# Patient Record
Sex: Female | Born: 1945
Health system: Southern US, Community
[De-identification: ages and names within clinical notes are randomized; demographics above are authoritative.]

## PROBLEM LIST (undated history)

## (undated) DIAGNOSIS — E039 Hypothyroidism, unspecified: Secondary | ICD-10-CM

## (undated) DIAGNOSIS — E538 Deficiency of other specified B group vitamins: Secondary | ICD-10-CM

## (undated) DIAGNOSIS — F1010 Alcohol abuse, uncomplicated: Secondary | ICD-10-CM

## (undated) DIAGNOSIS — F329 Major depressive disorder, single episode, unspecified: Secondary | ICD-10-CM

## (undated) DIAGNOSIS — L4050 Arthropathic psoriasis, unspecified: Secondary | ICD-10-CM

## (undated) DIAGNOSIS — J449 Chronic obstructive pulmonary disease, unspecified: Secondary | ICD-10-CM

## (undated) DIAGNOSIS — F419 Anxiety disorder, unspecified: Secondary | ICD-10-CM

## (undated) DIAGNOSIS — F32A Depression, unspecified: Secondary | ICD-10-CM

## (undated) DIAGNOSIS — M8580 Other specified disorders of bone density and structure, unspecified site: Secondary | ICD-10-CM

## (undated) DIAGNOSIS — M1990 Unspecified osteoarthritis, unspecified site: Secondary | ICD-10-CM

## (undated) HISTORY — DX: Hypothyroidism, unspecified: E03.9

## (undated) HISTORY — PX: VITRECTOMY: SHX106

## (undated) HISTORY — DX: Anxiety disorder, unspecified: F41.9

## (undated) HISTORY — DX: Unspecified osteoarthritis, unspecified site: M19.90

## (undated) HISTORY — DX: Deficiency of other specified B group vitamins: E53.8

## (undated) HISTORY — DX: Other specified disorders of bone density and structure, unspecified site: M85.80

## (undated) HISTORY — DX: Arthropathic psoriasis, unspecified: L40.50

## (undated) HISTORY — DX: Major depressive disorder, single episode, unspecified: F32.9

## (undated) HISTORY — DX: Depression, unspecified: F32.A

## (undated) HISTORY — PX: CATARACT EXTRACTION, BILATERAL: SHX1313

## (undated) HISTORY — DX: Alcohol abuse, uncomplicated: F10.10

---

## 1998-09-04 ENCOUNTER — Encounter: Payer: Self-pay | Admitting: Neurological Surgery

## 1998-09-04 ENCOUNTER — Ambulatory Visit (HOSPITAL_COMMUNITY): Admission: RE | Admit: 1998-09-04 | Discharge: 1998-09-04 | Payer: Self-pay | Admitting: Neurological Surgery

## 1999-03-03 ENCOUNTER — Encounter: Admission: RE | Admit: 1999-03-03 | Discharge: 1999-03-03 | Payer: Self-pay | Admitting: *Deleted

## 1999-03-03 ENCOUNTER — Encounter: Payer: Self-pay | Admitting: *Deleted

## 1999-03-04 ENCOUNTER — Ambulatory Visit (HOSPITAL_COMMUNITY): Admission: RE | Admit: 1999-03-04 | Discharge: 1999-03-04 | Payer: Self-pay | Admitting: Neurological Surgery

## 1999-03-04 ENCOUNTER — Encounter: Payer: Self-pay | Admitting: Neurological Surgery

## 2000-06-21 ENCOUNTER — Encounter: Admission: RE | Admit: 2000-06-21 | Discharge: 2000-09-19 | Payer: Self-pay | Admitting: Anesthesiology

## 2000-07-05 ENCOUNTER — Encounter: Payer: Self-pay | Admitting: *Deleted

## 2000-07-05 ENCOUNTER — Encounter: Admission: RE | Admit: 2000-07-05 | Discharge: 2000-07-05 | Payer: Self-pay | Admitting: *Deleted

## 2001-05-25 ENCOUNTER — Other Ambulatory Visit: Admission: RE | Admit: 2001-05-25 | Discharge: 2001-05-25 | Payer: Self-pay | Admitting: *Deleted

## 2001-07-03 ENCOUNTER — Encounter: Payer: Self-pay | Admitting: *Deleted

## 2001-07-03 ENCOUNTER — Encounter: Admission: RE | Admit: 2001-07-03 | Discharge: 2001-07-03 | Payer: Self-pay | Admitting: *Deleted

## 2001-07-19 ENCOUNTER — Encounter: Payer: Self-pay | Admitting: Neurological Surgery

## 2001-07-19 ENCOUNTER — Encounter: Admission: RE | Admit: 2001-07-19 | Discharge: 2001-07-19 | Payer: Self-pay | Admitting: Neurological Surgery

## 2003-12-03 ENCOUNTER — Encounter: Admission: RE | Admit: 2003-12-03 | Discharge: 2004-01-21 | Payer: Self-pay | Admitting: Neurological Surgery

## 2005-12-30 ENCOUNTER — Encounter: Admission: RE | Admit: 2005-12-30 | Discharge: 2005-12-30 | Payer: Self-pay | Admitting: Neurological Surgery

## 2006-12-12 ENCOUNTER — Inpatient Hospital Stay (HOSPITAL_COMMUNITY): Admission: EM | Admit: 2006-12-12 | Discharge: 2006-12-13 | Payer: Self-pay | Admitting: Emergency Medicine

## 2006-12-12 ENCOUNTER — Encounter (INDEPENDENT_AMBULATORY_CARE_PROVIDER_SITE_OTHER): Payer: Self-pay | Admitting: General Surgery

## 2006-12-16 ENCOUNTER — Emergency Department (HOSPITAL_COMMUNITY): Admission: EM | Admit: 2006-12-16 | Discharge: 2006-12-17 | Payer: Self-pay | Admitting: Emergency Medicine

## 2009-02-14 ENCOUNTER — Encounter: Admission: RE | Admit: 2009-02-14 | Discharge: 2009-02-14 | Payer: Self-pay | Admitting: Family Medicine

## 2009-04-29 ENCOUNTER — Encounter: Payer: Self-pay | Admitting: Emergency Medicine

## 2009-05-07 DIAGNOSIS — F3289 Other specified depressive episodes: Secondary | ICD-10-CM | POA: Insufficient documentation

## 2009-05-07 DIAGNOSIS — M949 Disorder of cartilage, unspecified: Secondary | ICD-10-CM

## 2009-05-07 DIAGNOSIS — M1990 Unspecified osteoarthritis, unspecified site: Secondary | ICD-10-CM | POA: Insufficient documentation

## 2009-05-07 DIAGNOSIS — E538 Deficiency of other specified B group vitamins: Secondary | ICD-10-CM

## 2009-05-07 DIAGNOSIS — F411 Generalized anxiety disorder: Secondary | ICD-10-CM | POA: Insufficient documentation

## 2009-05-07 DIAGNOSIS — J45909 Unspecified asthma, uncomplicated: Secondary | ICD-10-CM | POA: Insufficient documentation

## 2009-05-07 DIAGNOSIS — F1010 Alcohol abuse, uncomplicated: Secondary | ICD-10-CM | POA: Insufficient documentation

## 2009-05-07 DIAGNOSIS — E039 Hypothyroidism, unspecified: Secondary | ICD-10-CM | POA: Insufficient documentation

## 2009-05-07 DIAGNOSIS — F329 Major depressive disorder, single episode, unspecified: Secondary | ICD-10-CM | POA: Insufficient documentation

## 2009-05-07 DIAGNOSIS — M899 Disorder of bone, unspecified: Secondary | ICD-10-CM | POA: Insufficient documentation

## 2009-05-08 ENCOUNTER — Ambulatory Visit: Payer: Self-pay | Admitting: Emergency Medicine

## 2009-06-02 ENCOUNTER — Ambulatory Visit: Payer: Self-pay | Admitting: Emergency Medicine

## 2009-06-02 DIAGNOSIS — L4050 Arthropathic psoriasis, unspecified: Secondary | ICD-10-CM

## 2009-06-02 DIAGNOSIS — J449 Chronic obstructive pulmonary disease, unspecified: Secondary | ICD-10-CM

## 2009-06-02 HISTORY — DX: Arthropathic psoriasis, unspecified: L40.50

## 2009-07-09 ENCOUNTER — Encounter: Payer: Self-pay | Admitting: Emergency Medicine

## 2009-07-17 ENCOUNTER — Encounter: Payer: Self-pay | Admitting: Emergency Medicine

## 2009-09-01 ENCOUNTER — Ambulatory Visit: Payer: Self-pay | Admitting: Emergency Medicine

## 2009-12-30 ENCOUNTER — Ambulatory Visit: Payer: Self-pay | Admitting: Emergency Medicine

## 2010-05-04 ENCOUNTER — Ambulatory Visit
Admission: RE | Admit: 2010-05-04 | Discharge: 2010-05-04 | Payer: Self-pay | Source: Home / Self Care | Attending: Emergency Medicine | Admitting: Emergency Medicine

## 2010-05-07 ENCOUNTER — Ambulatory Visit (HOSPITAL_COMMUNITY)
Admission: RE | Admit: 2010-05-07 | Discharge: 2010-05-07 | Payer: Self-pay | Source: Home / Self Care | Attending: Neurological Surgery | Admitting: Neurological Surgery

## 2010-05-22 LAB — BASIC METABOLIC PANEL
BUN: 11 mg/dL (ref 6–23)
CO2: 30 mEq/L (ref 19–32)
GFR calc Af Amer: >60 mL/min (ref >60)
GFR calc non Af Amer: >60 mL/min (ref >60)
Glucose, Bld: 75 mg/dL (ref 70–99)
Potassium: 3.9 mEq/L (ref 3.5–5.1)
Sodium: 139 mEq/L (ref 135–145)

## 2010-05-22 LAB — TYPE AND SCREEN: ABO/RH(D): A POS

## 2010-05-22 LAB — ABO/RH: ABO/RH(D): A POS

## 2010-05-22 LAB — SURGICAL PCR SCREEN: MRSA, PCR: NEGATIVE

## 2010-05-22 LAB — CBC
Hemoglobin: 12.2 g/dL (ref 12.0–15.0)
MCV: 94.4 fL (ref 78.0–100.0)
RBC: 3.94 MIL/uL (ref 3.87–5.11)

## 2010-05-25 ENCOUNTER — Inpatient Hospital Stay (HOSPITAL_COMMUNITY)
Admission: RE | Admit: 2010-05-25 | Discharge: 2010-05-26 | Disposition: A | Discharge status: Home / Self Care | Payer: Self-pay | Source: Home / Self Care | Attending: Neurological Surgery | Admitting: Neurological Surgery

## 2010-05-25 HISTORY — PX: SPINE SURGERY: SHX786

## 2010-05-28 NOTE — Assessment & Plan Note (Signed)
Summary: COPD   Visit Type:  Follow-up Copy to:  Dr. Juluis Rainier  CC:  4 mo COPD follow-up...the patient says her breathing has slightly improved...no breathing complaints today.  History of Present Illness: 65 yo smoker, former EtOH abuse, hx hypothroidism, DJD, anxiety. Was diagnosed clinically with "asthma" as an adult in 2000 in setting bronchospasm and the flu. Has had similar episodes over the last few yrs, required abx and steroids multiple times. Has been on Advair x 2+ yrs. Also has allergies. Complians today of exhaustion, cough prod of some mucous. Has had some mild epistaxis.   Seen by Dr Zachery Dauer 04/29/09 for acute flare in setting URI. Tretated with pred and abx both are finished, now improved but still with the symptoms above. CXR without infiltrate per pt's report.   ROV 06/02/09 -- returns feeling better since flare. She quit smoking a month ago!!  Had PFT's today as below. Taking Advair two times a day, Using proventil less than every day.   ROV 09/01/09 -- returns for COPD. She has NOT gone back to smoking! Last visit we added Spiriva to Advair two times a day. Also started O2 with exertion and sleep.  Exercise tolerance is significantly improved. No flares since last time.   ROV 12/30/09 -- COPD, ? asthmatic component. presents today for f/u. Has had more trouble w PND, congestion, some more cough prod of white thick phlegm, ? more chest tightness for the last 2 weeks. Using proventil   ROV 05/04/10 -- COPD, suspected reactive component, chronic rhinitis.  Since last visit feels basically stable. No URI's, no flares, no hospitalizations. She is seeing Dr Danielle Dess for back/spine problems, may need repeat back SGY. FEV1 37% predicted (0.72L) Currently on Advair, uses SABA rarely, about weekly.   Current Medications (verified): 1)  Glucosamine 500 Mg Caps (Glucosamine Sulfate) .... Once Daily 2)  Calcium 600 Mg Tabs (Calcium) .... Once Daily 3)  Vicodin 5-500 Mg Tabs  (Hydrocodone-Acetaminophen) .Marland Kitchen.. 1 By Mouth Every 6 Hours As Needed For Pain 4)  Synthroid 50 Mcg Tabs (Levothyroxine Sodium) .Marland Kitchen.. 1 By Mouth Daily 5)  Advair Diskus 500-50 Mcg/dose Aepb (Fluticasone-Salmeterol) .Marland Kitchen.. 1 Puff Two Times A Day 6)  Flonase 50 Mcg/act Susp (Fluticasone Propionate) .Marland Kitchen.. 1 Spray in Each Nostril Daily 7)  Albuterol Sulfate (2.5 Mg/71ml) 0.083% Nebu (Albuterol Sulfate) .Marland Kitchen.. 1 Vial in Nebulizer Every 4-6 Hours As Needed 8)  Proventil Hfa 108 (90 Base) Mcg/act Aers (Albuterol Sulfate) .Marland Kitchen.. 1 Puff Every 4 Hours As Needed 9)  Artificial Tears  Soln (Artificial Tear Solution) .... As Directed 10)  Fish Oil 1000 Mg Caps (Omega-3 Fatty Acids) .Marland Kitchen.. 1 By Mouth Daily 11)  Nicoderm Cq 14 Mg/24hr Pt24 (Nicotine) .... As Directed 12)  Spiriva Handihaler 18 Mcg Caps (Tiotropium Bromide Monohydrate) .Marland Kitchen.. 1 Inhalation Once Daily  Allergies (verified): 1)  ! Phenergan 2)  ! Pcn  Vital Signs:  Patient profile:   65 year old female Height:      61 inches (154.94 cm) Weight:      103 pounds (46.82 kg) BMI:     19.53 O2 Sat:      92 % on 2 L/min pulsing Temp:     97.8 degrees F (36.56 degrees C) oral Pulse rate:   83 / minute BP sitting:   98 / 62  (left arm) Cuff size:   regular  O2 Sat at Rest %:  92 O2 Flow:  2 L/min pulsing CC: 4 mo COPD follow-up...the patient says her  breathing has slightly improved...no breathing complaints today Comments Medications reviewed with patient Michel Bickers CMA  May 04, 2010 3:11 PM   Physical Exam  General:  NAD, thin, throaty voice, no overt stridor  Head:  normocephalic and atraumatic Eyes:  conjunctiva and sclera clear Nose:  no deformity, discharge, inflammation, or lesions Mouth:  erythematous post pharynx Neck:  no masses, thyromegaly, or abnormal cervical nodes, no stridor Lungs:  distance, few insp squeeks, no wheeze in normal resp cycle, Heart:  regular rate and rhythm, S1, S2 without murmurs, rubs, gallops, or  clicks Abdomen:  not examined Msk:  no deformity or scoliosis noted with normal posture Extremities:  no clubbing, cyanosis, edema, or deformity noted Neurologic:  non-focal Skin:  intact without lesions or rashes Psych:  depressed affect.     Impression & Recommendations:  Problem # 1:  COPD (ICD-496)  Problem # 2:  DEGENERATIVE JOINT DISEASE (ICD-715.90)  Her updated medication list for this problem includes:    Vicodin 5-500 Mg Tabs (Hydrocodone-acetaminophen) .Marland Kitchen... 1 by mouth every 6 hours as needed for pain  Other Orders: Est. Patient Level IV (16109)  Patient Instructions: 1)  Please continue your Spiriva and Advair 2)  Use your O2 at all times.  3)  Use Proventil as needed  4)  Follow up with Dr [name] in 4 months or as needed

## 2010-05-28 NOTE — Miscellaneous (Signed)
Summary: Orders Update pft charges  Clinical Lists Changes  Orders: Added new Service order of Carbon Monoxide diffusing w/capacity (94720) - Signed Added new Service order of Lung Volumes (94240) - Signed Added new Service order of Spirometry (Pre & Post) (94060) - Signed 

## 2010-05-28 NOTE — Assessment & Plan Note (Signed)
Summary: COPD   Visit Type:  Follow-up Copy to:  Dr. Juluis Rainier  CC:  3 month COPD follow up.  Pt states breathing is better.  States she has only had a few episodes of wheezing and chest tightness - relates this to the weather.  Pt states she does have "some" coughing - prod with clear mucus.  States spiriva is helping with cough and breathing.  Marland Kitchen  History of Present Illness: 65 yo smoker, former EtOH abuse, hx hypothroidism, DJD, anxiety. Was diagnosed clinically with "asthma" as an adult in 2000 in setting bronchospasm and the flu. Has had similar episodes over the last few yrs, required abx and steroids multiple times. Has been on Advair x 2+ yrs. Also has allergies. Complians today of exhaustion, cough prod of some mucous. Has had some mild epistaxis.   Seen by Dr Zachery Dauer 04/29/09 for acute flare in setting URI. Tretated with pred and abx both are finished, now improved but still with the symptoms above. CXR without infiltrate per pt's report.   ROV 06/02/09 -- returns feeling better since flare. She quit smoking a month ago!!  Had PFT's today as below. Taking Advair two times a day, Using proventil less than every day.   ROV 09/01/09 -- returns for COPD. She has NOT gone back to smoking! Last visit we added Spiriva to Advair two times a day. Also started O2 with exertion and sleep.  Exercise tolerance is significantly improved. No flares since last time.   Current Medications (verified): 1)  Glucosamine 500 Mg Caps (Glucosamine Sulfate) .... Once Daily 2)  Calcium 600 Mg Tabs (Calcium) .... Once Daily 3)  Vicodin 5-500 Mg Tabs (Hydrocodone-Acetaminophen) .Marland Kitchen.. 1 By Mouth Every 6 Hours As Needed For Pain 4)  Synthroid 50 Mcg Tabs (Levothyroxine Sodium) .Marland Kitchen.. 1 By Mouth Daily 5)  Cymbalta 30 Mg Cpep (Duloxetine Hcl) .Marland Kitchen.. 1 By Mouth Daily 6)  Advair Diskus 500-50 Mcg/dose Aepb (Fluticasone-Salmeterol) .Marland Kitchen.. 1 Puff Two Times A Day 7)  Flonase 50 Mcg/act Susp (Fluticasone Propionate) .Marland Kitchen.. 1  Spray in Each Nostril Daily 8)  Albuterol Sulfate (2.5 Mg/71ml) 0.083% Nebu (Albuterol Sulfate) .Marland Kitchen.. 1 Vial in Nebulizer Every 4-6 Hours As Needed 9)  Proventil Hfa 108 (90 Base) Mcg/act Aers (Albuterol Sulfate) .Marland Kitchen.. 1 Puff Every 4 Hours As Needed 10)  Artificial Tears  Soln (Artificial Tear Solution) .... As Directed 11)  Fish Oil 1000 Mg Caps (Omega-3 Fatty Acids) .Marland Kitchen.. 1 By Mouth Daily 12)  Nicoderm Cq 14 Mg/24hr Pt24 (Nicotine) .... As Directed 13)  Spiriva Handihaler 18 Mcg Caps (Tiotropium Bromide Monohydrate) .Marland Kitchen.. 1 Inhalation Once Daily  Allergies (verified): 1)  ! Phenergan 2)  ! Pcn  Vital Signs:  Patient profile:   65 year old female Height:      61 inches Weight:      99 pounds BMI:     18.77 O2 Sat:      97 % on Room air Temp:     98.0 degrees F oral Pulse rate:   71 / minute BP sitting:   100 / 62  (right arm) Cuff size:   regular  Vitals Entered By: Gweneth Dimitri RN (Sep 01, 2009 9:59 AM)  O2 Flow:  Room air CC: 3 month COPD follow up.  Pt states breathing is better.  States she has only had a few episodes of wheezing and chest tightness - relates this to the weather.  Pt states she does have "some" coughing - prod with clear mucus.  States spiriva is helping with cough and breathing.   Comments Medications reviewed with patient Daytime contact number verified with patient. Gweneth Dimitri RN  Sep 01, 2009 9:59 AM    Physical Exam  General:  NAD, thin, throaty voice, no overt stridor but noisy inspiration Head:  normocephalic and atraumatic Eyes:  conjunctiva and sclera clear Nose:  no deformity, discharge, inflammation, or lesions Mouth:  erythematous post pharynx Neck:  no masses, thyromegaly, or abnormal cervical nodes, no stridor Lungs:  distance, few insp squeeks, no wheeze in normal resp cycle, positive wheeze on forced exp Heart:  regular rate and rhythm, S1, S2 without murmurs, rubs, gallops, or clicks Abdomen:  not examined Msk:  no deformity or  scoliosis noted with normal posture Extremities:  no clubbing, cyanosis, edema, or deformity noted Neurologic:  non-focal Skin:  intact without lesions or rashes Psych:  depressed affect.     Medications Added to Medication List This Visit: 1)  Nicoderm Cq 14 Mg/24hr Pt24 (Nicotine) .... As directed  Other Orders: Est. Patient Level IV (95621)  Patient Instructions: 1)  Congratulations on stopping smoking!! 2)  Continue your oxygen with all exertion and while sleeping. 3)  Continue your Spiriva and Advair as you are taking them 4)  Follow up with Dr [name] in 4 months or as needed.  Prescriptions: PROVENTIL HFA 108 (90 BASE) MCG/ACT AERS (ALBUTEROL SULFATE) 1 puff every 4 hours as needed  #1 x 11   Entered and Authorized by:   Leslye Peer MD   Signed by:   Leslye Peer MD on 09/01/2009   Method used:   Print then Give to Patient   RxID:   [id number] ADVAIR DISKUS 500-50 MCG/DOSE AEPB (FLUTICASONE-SALMETEROL) 1 puff two times a day  #1 x 11   Entered and Authorized by:   Leslye Peer MD   Signed by:   Leslye Peer MD on 09/01/2009   Method used:   Print then Give to Patient   RxID:   [id number]

## 2010-05-28 NOTE — Assessment & Plan Note (Signed)
Summary: COPD   Visit Type:  Follow-up Copy to:  Dr. Juluis Rainier  CC:  Asthma follow-up with PFT's.  The patient c/o a cough since she stopped smoking 1 month ago. She is using the nicotine patch..  History of Present Illness: 65 yo smoker, former EtOH abuse, hx hypothroidism, DJD, anxiety. Was diagnosed clinically with "asthma" as an adult in 2000 in setting bronchospasm and the flu. Has had similar episodes over the last few yrs, required abx and steroids multiple times. Has been on Advair x 2+ yrs. Also has allergies. Complians today of exhaustion, cough prod of some mucous. Has had some mild epistaxis.   Seen by Dr Zachery Dauer 04/29/09 for acute flare in setting URI. Tretated with pred and abx both are finished, now improved but still with the symptoms above. CXR without infiltrate per pt's report.   ROV 06/02/09 -- returns feeling better since flare. She quit smoking a month ago!!  Had PFT's today as below. Taking Advair two times a day, Using proventil less than every day.   Preventive Screening-Counseling & Management  Alcohol-Tobacco     Smoking Status: quit < 6 months     Year Quit: 2011  Current Medications (verified): 1)  Glucosamine 500 Mg Caps (Glucosamine Sulfate) .... Once Daily 2)  Calcium 600 Mg Tabs (Calcium) .... Once Daily 3)  Vicodin 5-500 Mg Tabs (Hydrocodone-Acetaminophen) .Marland Kitchen.. 1 By Mouth Every 6 Hours As Needed For Pain 4)  Tylenol Extra Strength 500 Mg Tabs (Acetaminophen) .... As Needed 5)  Synthroid 50 Mcg Tabs (Levothyroxine Sodium) .Marland Kitchen.. 1 By Mouth Daily 6)  Cymbalta 30 Mg Cpep (Duloxetine Hcl) .Marland Kitchen.. 1 By Mouth Daily 7)  Advair Diskus 500-50 Mcg/dose Aepb (Fluticasone-Salmeterol) .Marland Kitchen.. 1 Puff Two Times A Day 8)  Flonase 50 Mcg/act Susp (Fluticasone Propionate) .Marland Kitchen.. 1 Spray in Each Nostril Daily 9)  Albuterol Sulfate (2.5 Mg/34ml) 0.083% Nebu (Albuterol Sulfate) .Marland Kitchen.. 1 Vial in Nebulizer Every 4-6 Hours As Needed 10)  Proventil Hfa 108 (90 Base) Mcg/act Aers  (Albuterol Sulfate) .Marland Kitchen.. 1 Puff Every 4 Hours As Needed 11)  Artificial Tears  Soln (Artificial Tear Solution) .... As Directed 12)  Fish Oil 1000 Mg Caps (Omega-3 Fatty Acids) .Marland Kitchen.. 1 By Mouth Daily 13)  Nicoderm Cq 21 Mg/24hr Pt24 (Nicotine) .... As Directed  Allergies (verified): 1)  ! Phenergan 2)  ! Pcn  Social History: Smoking Status:  quit < 6 months  Vital Signs:  Patient profile:   65 year old female Height:      61 inches (154.94 cm) Weight:      97 pounds (44.09 kg) BMI:     18.39 O2 Sat:      95 % on Room air Temp:     97.8 degrees F (36.56 degrees C) oral Pulse rate:   81 / minute BP sitting:   104 / 62  (left arm) Cuff size:   small  Vitals Entered By: Michel Bickers CMA (June 02, 2009 3:54 PM)  O2 Sat at Rest %:  95 O2 Flow:  Room air  Serial Vital Signs/Assessments:  Comments: 4:50 PM Ambulatory Pulse Oximetry  Resting; HR__52___    02 Sat__92___  Lap1 (185 feet)   HR__113___   02 Sat__80___ Lap2 (185 feet)   HR_____   02 Sat_____    Lap3 (185 feet)   HR_____   02 Sat_____  ___Test Completed without Difficulty __x_Test Stopped due to: patients sats dropped to 80% on room air after 1 lap. The patient was  placed on 2 liters of oxygen and her sats                                           recovered to 96% on 2L within 1 minute.  By: Michel Bickers CMA   CC: Asthma follow-up with PFT's.  The patient c/o a cough since she stopped smoking 1 month ago. She is using the nicotine patch.   Physical Exam  General:  NAD, thin, throaty voice, no overt stridor but noisy inspiration Head:  normocephalic and atraumatic Eyes:  conjunctiva and sclera clear Nose:  no deformity, discharge, inflammation, or lesions Mouth:  erythematous post pharynx Neck:  no masses, thyromegaly, or abnormal cervical nodes, no stridor Lungs:  distance, few insp squeeks, no wheeze in normal resp cycle, positive wheeze on forced exp Heart:  regular rate and rhythm, S1, S2 without  murmurs, rubs, gallops, or clicks Abdomen:  not examined Msk:  no deformity or scoliosis noted with normal posture Extremities:  no clubbing, cyanosis, edema, or deformity noted Neurologic:  non-focal Skin:  intact without lesions or rashes Psych:  depressed affect.     Pulmonary Function Test Date: 06/02/2009 Height (in.): 61 Gender: Female  Pre-Spirometry FVC    Value: 2.08 L/min   Pred: 2.68 L/min     % Pred: 78 % FEV1    Value: 0.72 L     Pred: 1.95 L     % Pred: 37 % FEV1/FVC  Value: 35 %     Pred: 73 %    FEF 25-75  Value: 0.18 L/min   Pred: 2.35 L/min     % Pred: 8 %  Post-Spirometry FVC    Value: 2.36 L/min   Pred: 2.68 L/min     % Pred: 88 % FEV1    Value: 0.79 L     Pred: 0.72 L     % Pred: 40 % FEV1/FVC  Value: 33 %     Pred: 73 %    FEF 25-75  Value: 0.21 L/min   Pred: 2.35 L/min     % Pred: 9 %  Lung Volumes TLC    Value: 5.10 L   % Pred: 119 % RV    Value: 3.01 L   % Pred: 186 % DLCO    Value: 7.2 %   % Pred: 49 % DLCO/VA  Value: 2.27 %   % Pred: 62 %  Comments: Severe AFL, positive BD response. Hyperinflated volumes. Decreased difusion capacity. RSB  Impression & Recommendations:  Problem # 1:  COPD (ICD-496) Walking oximetry today showed that your oxygen level drops.. We will start oxygen with exertion.  Continue Advair two times a day Start Spiriva 1 inhalation once daily  Congratulations on quitting tobacco!! Call our office if you feel like you are going to restart - we can help you.  Follow up with Dr [name] in 3 months or as needed   Medications Added to Medication List This Visit: 1)  Nicoderm Cq 21 Mg/24hr Pt24 (Nicotine) .... As directed 2)  Spiriva Handihaler 18 Mcg Caps (Tiotropium bromide monohydrate) .Marland Kitchen.. 1 inhalation once daily  Other Orders: Est. Patient Level IV (16109) DME Referral (DME)  Patient Instructions: 1)  Walking oximetry today showed that your oxygen level drops.. We will start oxygen with exertion.  2)  Continue Advair two  times a day 3)  Start Spiriva  1 inhalation once daily  4)  Congratulations on quitting tobacco!! Call our office if you feel like you are going to restart - we can help you.  5)  Follow up with Dr [name] in 3 months or as needed  Prescriptions: SPIRIVA HANDIHALER 18 MCG CAPS (TIOTROPIUM BROMIDE MONOHYDRATE) 1 inhalation once daily  #30 x 11   Entered and Authorized by:   Leslye Peer MD   Signed by:   Leslye Peer MD on 06/02/2009   Method used:   Print then Give to Patient   RxID:   [id number]

## 2010-05-28 NOTE — Letter (Signed)
Summary: [facility]   Imported By: Lester [place] 05/09/2009 10:42:13  _____________________________________________________________________  External Attachment:    Type:   Image     Comment:   External Document

## 2010-05-28 NOTE — Assessment & Plan Note (Signed)
Summary: COPD   Visit Type:  Follow-up Copy to:  Dr. Juluis Rainier  CC:  COPD.Marland KitchenMarland KitchenThe patient c/o "asthma-like" symptoms and prod cough x2 weeks.  History of Present Illness: 65 yo smoker, former EtOH abuse, hx hypothroidism, DJD, anxiety. Was diagnosed clinically with "asthma" as an adult in 2000 in setting bronchospasm and the flu. Has had similar episodes over the last few yrs, required abx and steroids multiple times. Has been on Advair x 2+ yrs. Also has allergies. Complians today of exhaustion, cough prod of some mucous. Has had some mild epistaxis.   Seen by Dr Zachery Dauer 04/29/09 for acute flare in setting URI. Tretated with pred and abx both are finished, now improved but still with the symptoms above. CXR without infiltrate per pt's report.   ROV 06/02/09 -- returns feeling better since flare. She quit smoking a month ago!!  Had PFT's today as below. Taking Advair two times a day, Using proventil less than every day.   ROV 09/01/09 -- returns for COPD. She has NOT gone back to smoking! Last visit we added Spiriva to Advair two times a day. Also started O2 with exertion and sleep.  Exercise tolerance is significantly improved. No flares since last time.   ROV 12/30/09 -- COPD, ? asthmatic component. presents today for f/u. Has had more trouble w PND, congestion, some more cough prod of white thick phlegm, ? more chest tightness for the last 2 weeks. Using proventil   Current Medications (verified): 1)  Glucosamine 500 Mg Caps (Glucosamine Sulfate) .... Once Daily 2)  Calcium 600 Mg Tabs (Calcium) .... Once Daily 3)  Vicodin 5-500 Mg Tabs (Hydrocodone-Acetaminophen) .Marland Kitchen.. 1 By Mouth Every 6 Hours As Needed For Pain 4)  Synthroid 50 Mcg Tabs (Levothyroxine Sodium) .Marland Kitchen.. 1 By Mouth Daily 5)  Advair Diskus 500-50 Mcg/dose Aepb (Fluticasone-Salmeterol) .Marland Kitchen.. 1 Puff Two Times A Day 6)  Flonase 50 Mcg/act Susp (Fluticasone Propionate) .Marland Kitchen.. 1 Spray in Each Nostril Daily 7)  Albuterol Sulfate (2.5  Mg/61ml) 0.083% Nebu (Albuterol Sulfate) .Marland Kitchen.. 1 Vial in Nebulizer Every 4-6 Hours As Needed 8)  Proventil Hfa 108 (90 Base) Mcg/act Aers (Albuterol Sulfate) .Marland Kitchen.. 1 Puff Every 4 Hours As Needed 9)  Artificial Tears  Soln (Artificial Tear Solution) .... As Directed 10)  Fish Oil 1000 Mg Caps (Omega-3 Fatty Acids) .Marland Kitchen.. 1 By Mouth Daily 11)  Nicoderm Cq 14 Mg/24hr Pt24 (Nicotine) .... As Directed 12)  Spiriva Handihaler 18 Mcg Caps (Tiotropium Bromide Monohydrate) .Marland Kitchen.. 1 Inhalation Once Daily  Allergies (verified): 1)  ! Phenergan 2)  ! Pcn  Past History:  Past Surgical History: Bilateral Cataract Extraction Right Vitrectomy due to cataract surgery complication  Vital Signs:  Patient profile:   65 year old female Height:      61 inches (154.94 cm) Weight:      102.25 pounds (46.48 kg) BMI:     19.39 O2 Sat:      96 % on 2 L/min Temp:     98.3 degrees F (36.83 degrees C) oral Pulse rate:   79 / minute BP sitting:   118 / 72  (right arm) Cuff size:   regular  Vitals Entered By: Michel Bickers CMA (December 30, 2009 11:28 AM)  O2 Sat at Rest %:  96 O2 Flow:  2 L/min CC: COPD.Marland KitchenMarland KitchenThe patient c/o "asthma-like" symptoms and prod cough x2 weeks Comments Medications reviewed with the patient. Daytime phone verified. Michel Bickers [facility]  December 30, 2009 11:29 AM   Physical  Exam  General:  NAD, thin, throaty voice, no overt stridor but noisy inspiration Head:  normocephalic and atraumatic Eyes:  conjunctiva and sclera clear Nose:  no deformity, discharge, inflammation, or lesions Mouth:  erythematous post pharynx Neck:  no masses, thyromegaly, or abnormal cervical nodes, no stridor Lungs:  distance, few insp squeeks, no wheeze in normal resp cycle, positive wheeze on forced exp Heart:  regular rate and rhythm, S1, S2 without murmurs, rubs, gallops, or clicks Abdomen:  not examined Msk:  no deformity or scoliosis noted with normal posture Extremities:  no clubbing, cyanosis, edema, or  deformity noted Neurologic:  non-focal Skin:  intact without lesions or rashes Psych:  depressed affect.     Impression & Recommendations:  Problem # 1:  COPD (ICD-496) Slightly worse from some nasal congestion, not exacerbated at this time - same bd's - as needed decongestants - flonase - O2 - she will cal if worse in any way - rov in 4 mo - flu shot today  Other Orders: Est. Patient Level IV (95621) DME Referral (DME)  Patient Instructions: 1)  Continue your Advair and Spiriva 2)  Use Proventil as needed  3)  Continue your oxygen. We will add humidity to your system.  4)  Try using decongestants that contain either chlorphenerimine or bromphenerimine as directed.  5)  Flu shot today 6)  Call our office if your breathing worsens in any way 7)  Follow up with Dr [name] in 4 months or as needed   Appended Document: COPD    Clinical Lists Changes  Orders: Added new Service order of Flu Vaccine 20yrs + [phone]) - Signed Added new Service order of Admin 1st Vaccine ([id number]) - Signed Observations: Added new observation of FLU VAX#1VIS: 11/18/09 version given December 30, 2009. (12/30/2009 13:08) Added new observation of FLU VAXLOT: [id number] (12/30/2009 13:08) Added new observation of FLU VAX EXP: 10/06/2010 (12/30/2009 13:08) Added new observation of FLU VAXBY: Michel Bickers CMA (12/30/2009 13:08) Added new observation of FLU VAXRTE: IM (12/30/2009 13:08) Added new observation of FLU VAX DSE: 0.5 ml (12/30/2009 13:08) Added new observation of FLU VAXMFR: GlaxoSmithK[employer] (12/30/2009 13:08) Added new observation of FLU VAX SITE: left deltoid (12/30/2009 13:08) Added new observation of FLU VAX: Fluvax 3+ (12/30/2009 13:08)       Immunizations Administered:  Influenza Vaccine # 1:    Vaccine Type: Fluvax 3+    Site: left deltoid    Mfr: GlaxoSmithKline    Dose: 0.5 ml    Route: IM    Given by: Michel Bickers CMA    Exp. Date: 10/06/2010    Lot #: [id number]    VIS  given: 11/18/09 version given December 30, 2009.  Flu Vaccine Consent Questions:    Do you have a history of severe allergic reactions to this vaccine? no    Any prior history of allergic reactions to egg and/or gelatin? no    Do you have a sensitivity to the preservative Thimersol? no    Do you have a past history of Guillan-Barre Syndrome? no    Do you currently have an acute febrile illness? no    Have you ever had a severe reaction to latex? no    Vaccine information given and explained to patient? yes    Are you currently pregnant? no

## 2010-05-28 NOTE — Assessment & Plan Note (Signed)
Summary: asthma, tobacco   Visit Type:  Initial Consult Copy to:  Dr. Juluis Rainier  CC:  Pulmonary consult for asthma..  History of Present Illness: 65 yo smoker, former EtOH abuse, hx hypothroidism, DJD, anxiety. Was diagnosed clinically with "asthma" as an adult in 2000 in setting bronchospasm and the flu. Has had similar episodes over the last few yrs, required abx and steroids multiple times. Has been on Advair x 2+ yrs. Also has allergies. Complians today of exhaustion, cough prod of some mucous. Has had some mild epistaxis.   Seen by Dr Zachery Dauer 04/29/09 for acute flare in setting URI. Tretated with pred and abx both are finished, now improved but still with the symptoms above. CXR without infiltrate per pt's report.   Preventive Screening-Counseling & Management  Alcohol-Tobacco     Packs/Day: 1 -2 cig a day     Pack years: 30 - 40 pk-yrs  Current Medications (verified): 1)  Glucosamine 500 Mg Caps (Glucosamine Sulfate) .... Once Daily 2)  Calcium 600 Mg Tabs (Calcium) .... Once Daily 3)  Vicodin 5-500 Mg Tabs (Hydrocodone-Acetaminophen) .Marland Kitchen.. 1 By Mouth Every 6 Hours As Needed For Pain 4)  Tylenol Extra Strength 500 Mg Tabs (Acetaminophen) .... As Needed 5)  Synthroid 50 Mcg Tabs (Levothyroxine Sodium) .Marland Kitchen.. 1 By Mouth Daily 6)  Cymbalta 30 Mg Cpep (Duloxetine Hcl) .Marland Kitchen.. 1 By Mouth Daily 7)  Advair Diskus 500-50 Mcg/dose Aepb (Fluticasone-Salmeterol) .Marland Kitchen.. 1 Puff Two Times A Day 8)  Flonase 50 Mcg/act Susp (Fluticasone Propionate) .Marland Kitchen.. 1 Spray in Each Nostril Daily 9)  Albuterol Sulfate (2.5 Mg/58ml) 0.083% Nebu (Albuterol Sulfate) .Marland Kitchen.. 1 Vial in Nebulizer Every 4-6 Hours As Needed 10)  Proventil Hfa 108 (90 Base) Mcg/act Aers (Albuterol Sulfate) .Marland Kitchen.. 1 Puff Every 4 Hours As Needed 11)  Artificial Tears  Soln (Artificial Tear Solution) .... As Directed 12)  Fish Oil 1000 Mg Caps (Omega-3 Fatty Acids) .Marland Kitchen.. 1 By Mouth Daily  Allergies (verified): 1)  ! Phenergan 2)  !  Pcn  Social History: Patient is a current smoker.  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 Packs/Day:  1 -2 cig a day  Vital Signs:  Patient profile:   65 year old female Height:      61 inches (154.94 cm) Weight:      95.50 pounds (43.41 kg) BMI:     18.11 O2 Sat:      87 % on Room air Temp:     98.2 degrees F (36.78 degrees C) oral Pulse rate:   90 / minute BP sitting:   108 / 80  (left arm) Cuff size:   small  Vitals Entered By: Michel Bickers CMA (May 08, 2009 10:46 AM)  O2 Sat at Rest %:  87 O2 Flow:  Room air  Physical Exam  General:  NAD, thin, throaty voice, no overt stridor but noisy inspiration Head:  normocephalic and atraumatic Eyes:  conjunctiva and sclera clear Nose:  no deformity, discharge, inflammation, or lesions Mouth:  erythematous post pharynx Neck:  no masses, thyromegaly, or abnormal cervical nodes, no stridor Lungs:  distance, few insp squeeks, no wheeze in normal resp cycle, positive wheeze on forced exp Heart:  regular rate and rhythm, S1, S2 without murmurs, rubs, gallops, or clicks Abdomen:  not examined Msk:  no deformity or scoliosis noted with normal posture Extremities:  no clubbing, cyanosis, edema, or deformity noted Neurologic:  non-focal  Skin:  intact without lesions or rashes Psych:  depressed affect.     Impression & Recommendations:  Problem # 1:  ASTHMA (ICD-493.90) Suspect possible COPD as well, still smokes. Appears to be getting over this flare.  - Full PFT when she fully recovers to establish baseline  - continue Advair for now, as needed albuterol - address hoarse voice, possible allergies if symptoms don't resolve - discussed tobacco cessation  Medications Added to Medication List This Visit: 1)  Artificial Tears Soln (Artificial tear solution) .... As directed 2)  Fish Oil 1000 Mg Caps (Omega-3 fatty acids) .Marland Kitchen.. 1 by mouth  daily  Other Orders: Consultation Level IV (69629) Tobacco use cessation intermediate 3-10 minutes (52841)  Patient Instructions: 1)  We will perform full PFT's at your next visit 2)  Continue your Advair as you are taking it. 3)  Use albuterol as needed 4)  Follow up with Dr [name] in 2 -3 weeks or as needed.

## 2010-05-28 NOTE — Letter (Signed)
Summary: [facility] for [facility]  [facility]N for [facility]   Imported By: Sherian Rein 07/16/2009 13:48:43  _____________________________________________________________________  External Attachment:    Type:   Image     Comment:   External Document

## 2010-05-28 NOTE — Letter (Signed)
Summary: [facility] for [facility]  [facility] for [facility]   Imported By: Sherian Rein 07/23/2009 07:36:58  _____________________________________________________________________  External Attachment:    Type:   Image     Comment:   External Document

## 2010-06-09 ENCOUNTER — Telehealth: Payer: Self-pay | Admitting: Emergency Medicine

## 2010-06-17 NOTE — Progress Notes (Signed)
  Phone Note Refill Request Message from:  Fax from Pharmacy  Refills Requested: Medication #1:  SPIRIVA HANDIHALER 18 MCG CAPS 1 inhalation once daily.   Supply Requested: 1 month Initial call taken by: Zackery Barefoot CMA,  June 09, 2010 10:28 AM    Prescriptions: SPIRIVA HANDIHALER 18 MCG CAPS (TIOTROPIUM BROMIDE MONOHYDRATE) 1 inhalation once daily  #30 x 11   Entered by:   Zackery Barefoot CMA   Authorized by:   Leslye Peer MD   Signed by:   Zackery Barefoot CMA on 06/09/2010   Method used:   Faxed to ...       [facility] (retail)       [address] [place]       [address]       [place], [place]  [address]       Ph: [phone]       Fax: [phone]   RxID:   [id number]

## 2010-06-18 NOTE — Op Note (Signed)
  NAMEJAMECA, CHUMLEY               ACCOUNT NO.:  [id number]  MEDICAL RECORD NO.:  [id number]          PATIENT TYPE:  INP  LOCATION:  3533                         FACILITY:  [facility]  PHYSICIAN:  Stefani Dama, M.D.  DATE OF BIRTH:  1946/02/23  DATE OF PROCEDURE:  05/25/2010 DATE OF DISCHARGE:                              OPERATIVE REPORT   PREOPERATIVE DIAGNOSIS:  Thoracolumbar spondylosis with myelopathy, T11- T12 on the left.  POSTOPERATIVE DIAGNOSIS:  Thoracolumbar spondylosis with myelopathy, T11- T12 on the left.  PROCEDURE:  Laminotomy and foraminotomy, T11-T12 with decompression of spinal cord from synovial cyst in the left lateral spinal canal using operating microscope microdissection technique.  SURGEON:  Stefani Dama, MD  FIRST ASSISTANT:  Cristi Loron, MD  ANESTHESIA:  General endotracheal.  INDICATIONS:  Cheyenne [name] is a 65 year old individual who has had significant spondylitic disease in the cervical thoracic and lumbar spine.  Most recently, she has been having problems with low back pain, weakness, dysesthetic sensations in her lower extremities, and a total myelogram demonstrated presence of cord compression at T11-T12 level secondary to a synovial cyst and spondylitic overgrowth on the left side of her canal at T11-T12.  She is taken to the operating room.  PROCEDURE IN DETAIL:  The patient was brought to the operating room supine on the stretcher.  After smooth induction of general endotracheal anesthesia, she was turned prone.  The back was prepped with alcohol and DuraPrep and draped in a sterile fashion.  A midline incision was created and carried down to lumbodorsal fascia which was opened on the left side of midline.  After obtaining a localizing radiograph, I identified the T11-T12 interspace.  Then the paraspinous musculature was retracted on the left side and laminotomy was created removing the inferior margin of the lamina of the T11  out to including the medial facet wall.  The thickened yellow ligament in this area was taken down. Then with the use of the operating microscope and microdissection technique, we were able to locate and identify the cystic structure in the layers of the yellow ligament.  The facet was undercut on that left side and this material was then removed and dissected free of the dura which was moderately adherent.  Dr. Lovell Sheehan helped with this portion of the procedure, particularly with the dissection against the dura.  Once this area was decompressed and the area was noted be free and clear, hemostasis in the soft tissues was obtained.  The microscope was withdrawn and then the lumbodorsal fascia was closed with 2-0 Vicryl in an interrupted fashion, 2-0 Vicryl was used in the subcuticular tissues, and 3-0 Vicryl in the final subcuticular layer. Dermabond was placed on the skin.  Blood loss was estimated at less than 50 mL.     Stefani Dama, M.D.     Merla Riches  D:  05/25/2010  T:  05/26/2010  Job:  [id number]  Electronically Signed by Barnett Abu M.D. on 06/18/2010 08:01:52 AM

## 2010-09-08 NOTE — H&P (Signed)
Cheyenne [name], Cheyenne [name]               ACCOUNT NO.:  [id number]   MEDICAL RECORD NO.:  [id number]          PATIENT TYPE:  INP   LOCATION:  1525                         FACILITY:  [facility]   PHYSICIAN:  Anselm Pancoast. Weatherly, M.D.DATE OF BIRTH:  01-10-1946   DATE OF ADMISSION:  12/12/2006  DATE OF DISCHARGE:                              HISTORY & PHYSICAL   CHIEF COMPLAINT:  Severe pain right upper quadrant right lateral.   HISTORY OF PRESENT ILLNESS:  Ms. Cheyenne [name] is a 60-year of Caucasian female  who came to the emergency room at 11 o'clock with a significant onset of  right upper quadrant pain this morning.  She was seen by Dr. Verlan Friends who  obtained a CBC, acute abdomen, CMET, urinalysis.  White count was 15,000  with a marked left shift.  The urinalysis showed just a few red blood  cells, and acute abdomen series was nonspecific.  She then had an  ultrasound of the abdomen that showed a hydrops of the gallbladder with  mobile stone.  The right kidney thought to be unremarkable;  the liver  was thought to be unremarkable with a very small common bile duct.  I  was asked to see the patient at approximately 3 p.m.  She had required  Dilaudid on several occasions, and on questioning she said she has had a  history of back pain and takes Vicodin chronically usually about two  tablets a day.  The back discomfort is nothing like what she has  experienced today on questioning.   CHRONIC MEDICATION:  She is on medicine for COPD, air Diskus, Cymbalta,  Synthroid replacement, and albuterol.   HABITS:  She is a cigarette smoker.  Denies recreational drugs.  She has  a lot of stress in her family and has lost weight which she has been  unable to gain back.   PHYSICAL EXAMINATION:  GENERAL:  She is a very thin female who appears  her stated age.  She likes to kind of lay on her side.  Says she is more  comfortable that way.  VITAL SIGNS:  In the emergency room temperature was 97.5, blood pressure  154/63, pulse 77, respirations 18, weighs 97 pounds.  HEENT:  She appears adequately hydrated.  LUNGS:  Clear.  CARDIAC:  Normal sinus rhythm.  ABDOMEN:  She is kind of vaguely tender in the right upper quadrant.  It  is not rigid, and she is not distended.  She is not tender in the lower  abdomen, and on palpation pain is more right lateral rib area kind of  over the liver.  SKIN:  Unremarkable.  EXTREMITIES:  There is no pedal edema.   ADMISSION IMPRESSION:  Hydrops of the gallbladder with probably a stone.   PLAN:  Since she has continued to require pain medicine and has 15,000  white count, I recommended we go ahead and proceed with laparoscopic  cholecystectomy and cholangiogram this evening.  The patient is in  agreement. She has received 400 mg of Cipro in the emergency room before  going to the floor.  ______________________________  Anselm Pancoast. Zachery Dakins, M.D.     WJW/MEDQ  D:  12/12/2006  T:  12/13/2006  Job:  [id number]

## 2010-09-08 NOTE — Op Note (Signed)
[name], [name]               ACCOUNT NO.:  [id number]   MEDICAL RECORD NO.:  [id number]          PATIENT TYPE:  INP   LOCATION:  1525                         FACILITY:  [facility]   PHYSICIAN:  Anselm Pancoast. Weatherly, M.D.DATE OF BIRTH:  25-Feb-1946   DATE OF PROCEDURE:  12/12/2006  DATE OF DISCHARGE:                               OPERATIVE REPORT   CHIEF COMPLAINT:  Severe pain, right upper quadrant.   INDICATIONS FOR PROCEDURE:  Cheyenne [name] is a 65 year old female who had  the onset of pretty severe right upper, right kind of lateral, kind of  going to the flank pain this morning. She came to the emergency room  approximately at 11 a.m.  She has had vague pain, kind of a right flank  and right lateral other times, but never anything like this.  She was  seen by Dr. Fraser Din in the emergency room and her vital signs were  unremarkable.  Then a CBC and CMET were performed.  Her white count was  15,000 with a marked left shift.  Her CMET was unremarkable.  Her  urinalysis was negative.  There was a small amount of hemoglobin.  Then  an acute abdominal x-ray was obtained, and this showed no evidence of  any free air or anything remarkable.  The lungs were clear.  Then an  ultrasound of the abdomen was performed.  This showed a marked hydrops  of the gallbladder with kind of a mobile, probably stone, within the  gallbladder.  They looked at the kidneys, the common bile duct and  everything, and nothing else was definitely abnormal.  Then I was asked  to see the patient probably at about 3 p.m.  She had received Dilaudid  on three or four occasions when I saw her, but was still complaining of  pain in this area.  He sister, who was accompanying her, is a former  Engineer, civil (consulting) in [employer], and she was answering most of the questions, and  wondered if we could just go ahead and do a cholecystectomy.  She is not  rigid in the abdomen like she has a perforation, but her pain is  obviously low right upper  quadrant.  I recommended that we go ahead and  get an electrocardiogram and see how she is feeling.  She was continuing  to have pain and required another dosage of morphine.  I elected to go  ahead and proceed with a cholecystectomy.  She was given 400 mg of Cipro  while in the emergency room at probably 5 p.m. and she got to the  operating room probably at about 9:30 p.m.   SURGEON:  Anselm Pancoast. Zachery Dakins, M.D.   Mammie LorenzoAlfonse Alpers. Dagoberto Ligas, M.D.   OPERATION:  1. Laparoscopic cholecystectomy.  2. Incidental appendectomy.   DESCRIPTION OF PROCEDURE:  The patient was taken to the operative suite.  The induction of general anesthesia via endotracheal tube.  The patient  weighs only about 90 pounds.  A small incision below the umbilicus.  The  fascia was picked up.  We entered carefully into the peritoneal cavity.  She now had a little bit of kind of thin clear fluid in the right upper  quadrant.  The gallbladder is enormous but it is not acutely inflamed.  It looks like she is intermittently twisted around the gallbladder.  Also the appendix is in the right upper quadrant and does not really  look like acute appendicitis, but might be a little stiffer, and it is a  real long appendix, going up really to the area of the gallbladder.  Dr.  Velora Heckler scrubbed in and put the 5 mm lateral ports in the  appropriate position after anesthetizing the fascia and then we put the  gallbladder on stretch.  The cystic duct was quite thin, as was the  cystic artery.  These were both separated and clipped flush with the  gallbladder and then the [facility] catheter was put in the proximal cystic  duct and held in place with a clip.  Then an x-ray was obtained.  It  showed good prompt filling of the extra-hepatic biliary system and good  flow into the duodenum.  The cystic duct was triply clipped and then  divided.  The cystic artery was doubly clipped proximally, singly and  distally and divided.   Then the gallbladder was freed from its mesentery  and it was kind of on the mesentery.  The gallbladder was placed in the  Endo-Catch bag and brought out through the umbilicus.  Then we re-  inserted the Hasson cannula.  The appendix certainly looked stiffer than  usual.  The mucosa was in an atypical location.   I think with having this right upper quadrant pain and that is where the  appendix is, that it would be best to just go ahead and just do an  incidental appendectomy, but I think it is not going to be an acutely  inflamed appendix.  The appendiceal mesentery was divided with the  harmonic scalpel and taken right on down to the appendix.  Good  hemostasis.  Then the appendix where it joins the cecum was fired with  an endovascular GIA  and then the appendix placed in the Endo-Catch bag  and withdrawn.  There was good hemostasis.  On further inspection of the  right upper quadrant, I think we were looking at the kidney, and this  was noted to be a little prominent on the ultrasound.  It was thought  unremarkable.  She is so thin.  I think that we will actually get a  little false impression that the kidney is bigger than it actually is.  The __________ , the colon, the hepatic flexure and everything looks  okay.  The liver looks like it has been kind of traumatized, where it is  twisted around the gallbladder, but otherwise looked unremarkable,  except she basically has no fat in the liver.  The camera was switched  back to the upper 10 mm port.  The umbilical port was withdrawn.  I put  in an additional figure-of-eight suture of #0 Vicryl in the umbilical  fascia, as well as tying the pursestring.  The upper 10 mm trocar was  withdrawn on direct vision and I put a stitch in it.  The subcutaneous  wounds were closed with #4-0 Vicryl.  Benzoin and Steri-Strips on the  skin.   The patient tolerated the procedure nicely.  If she is still complaining  of pain in the morning, I will  get a CT.  If her pain is resolved, since  we have done an ultrasound, it is felt to be unremarkable, and there is  no actual significant blood in the urine.  I will then make a decision  on whether she needs a CT.           ______________________________  Anselm Pancoast. Zachery Dakins, M.D.     WJW/MEDQ  D:  12/12/2006  T:  12/13/2006  Job:  [id number]

## 2010-09-09 ENCOUNTER — Other Ambulatory Visit: Payer: Self-pay | Admitting: *Deleted

## 2010-09-09 MED ORDER — ALBUTEROL SULFATE HFA 108 (90 BASE) MCG/ACT IN AERS: 1.0000 | INHALATION_SPRAY | RESPIRATORY_TRACT | Status: DC | PRN

## 2010-09-11 NOTE — H&P (Signed)
[facility]  Patient:    Cheyenne [name], Cheyenne [name]                      MRN: [id number] Adm. Date:  [id number] Attending:  Thyra Breed CC:         Heather Roberts, M.D.  Stefani Dama, M.D.   History and Physical  HISTORY OF PRESENT ILLNESS:  Cheyenne [name] is a 65 year old who was sent to [place] by Dr. Barnett Abu for evaluation and recommendations concerning her chronic pain syndrome.  The patient complains of neck, thoracic, and lower back pain. The patient served as her historian and she had recent records from Dr. Danielle Dess for review.  She also sees Dr. Heather Roberts for her primary care evaluations.  The patient seemed very guarded in her responses.  She described problems with cervical, thoracic, and lumbar problems.  She stated that as a child she took a fall at the age of 57 and developed severe lower back discomfort.  Later, as a high school senior, she was diagnosed as having scoliosis at [facility] and treated with braces, which she wore for a brief period of time, and exercise.  She apparently was able to tolerate this discomfort, up until developing neck discomfort radiating out into the right upper extremity.  She failed medical treatment after a couple years and underwent a cervical procedure by Dr. Lynnette Caffey, which the patient described as a diskectomy with implants.  This was at T5-6, C6-7 in 1992.  She apparently did quite well for about five years.  Since then, she has had recurrence of an intense ache in her neck, which is worse in the mornings with no associated numbness or tingling or weakness in the upper extremities with intermittent spasms of the neck, which is responsive to Valium.  She also takes hydrocodone, which she states helps.  She states that her neck pain is made worse by "living."  Her lumbar problems became a significant problem in 1995, when she was seen by Dr. Danielle Dess and underwent foraminectomies.  She did well for 6-12 months  before recurrence of her pain.  At that time, she was reevaluated and subsequently underwent a two-level Ray cage fusion.  This helped tremendously in mobility but did little for her pain.  It did help with the numbness and her foot drop. She describes her current low-back pain as a constant discomfort which is achy and worse in the mornings responsive to hot showers and to her hydrocodone. She has intermittent numbness over the sole of her right foot.  Her left lower extremity is intact.  She had denied weakness or bowel incontinence.  She described a very vague description of intermittent bladder incontinence not necessarily associated with coughing or sneezing.  She could not identify what makes it worse.  Her lower back discomfort is made worse by stooping, kneeling, sitting, or standing.  It is improved by the hydrocodone and ice.  The patient has never been tried with a TENS unit.  She has had postop physical therapy, predominately with ______, which she has found helpful in the past.  She has not had biofeedback.  She does some self-hypnosis but it does not help her significantly.  She uses Valium for spasms.  She does note that the hydrocodone controls her pain to a significant extent.  She takes 2-3 tablets per day.  She has a history of alcoholism and is extremely worried about dependency issues.  She has been to AA, according  to Dr. Tonia Brooms reports, but the patient was very reluctant to share a great deal of details with regard to her substance in the past.  She also has seen Delton See Hodgkins for counseling.  She is on Effexor for depression.  CURRENT MEDICATIONS: 1. Vicodin 5/500 two to three tablets per day. 2. Valium 5 mg 1-2 tablets per day for spasms. 3. Effexor 37.5 mg. 4. Synthroid. 5. Premarin. 6. Goody powders.  ALLERGIES: 1. PENICILLIN. 2. BEE STINGS.  FAMILY HISTORY:  Positive for pericardial cancer, renal cell carcinoma, diabetes, thyroid disease, coronary  artery disease, and COPD.  PAST SURGICAL HISTORY:  Ectopic pregnancy in 1971.  Total abdominal hysterectomy with BSO in 1980.  Cervical spine surgery by Dr. Lynnette Caffey in 1992.  Foraminectomies in 1995 by Dr. Danielle Dess.  Ray cage placement in 1997.  SOCIAL HISTORY:  The patient is a half-pack per day smoker.  She does not drink alcohol currently.  She is a recovered alcoholic.  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  She has a degree in nursing but does not do this currently.  ACTIVE MEDICAL PROBLEMS:  Hypothyroidism and depression.  REVIEW OF SYSTEMS:  General:  Negative.  Head:  Significant for occipital headaches associated with her neck discomfort.  No previous history of migraines.  Eyes:  Significant for myopia.  Nose, mouth, throat:  Negative. Ears:  Negative.  Pulmonary:  Negative.  Cardiovascular:  Negative.  GI: Negative.  GU:  See HPI.  Musculoskeletal:  Negative for appendicular problems.  Positive for axial symptoms, as noted above.  Neurologic:  See HPI for pertinent positives.  No history of seizure or stroke.  Cutaneous: Negative.  Hematologic:  Negative.  Endocrine:  Positive for thyroid disease. Negative for diabetes in the patient.  Psychiatric:  Positive for depression. She has lost seven family members in a very brief period of time.  She acknowledges anger about her current limitations but on reviewing stages of grief she denies that she is at the anger stage.  Allergy/Immunologic: Positive for allergic rhinitis.  PHYSICAL EXAMINATION:  VITAL SIGNS:  Blood pressure 123/64, heart rate 66, respirations 18, O2 saturation 92%.  Pain level is 2/10.  HEENT:  Normocephalic, atraumatic.  Eyes:  Extraocular movements intact. Conjunctivae clear.  Nose patent.   Ears clear.  Oropharynx is free of lesions.  NECK:  Fairly supple without lymphadenopathy.  She had a well-healed surgical  scar over the anterior aspect of her neck.  Carotids were 2+ and  symmetric without bruits.  LUNGS:  Rhonchi predominately on the left side.  HEART:  Regular rate and rhythm.  She did have flattening of the dorsal curvature.  Lower back exam revealed well-healed surgical scar with minimal tenderness over the posterosuperior iliac spine and SI joints with good range of motion.  She is able to touch the floor easily with forward flexion and she can hyperextend at least 30 degrees.  BREASTS:  Not performed.  ABDOMEN:  Not performed.  PELVIC:  Not performed.  RECTAL:  Not performed.  EXTREMITIES:  The patient demonstrated some mild bluish discoloration of her hands and feet globally with radial pulses and dorsalis pedis pulses 2+ and symmetric with good capillary refill.  NEUROLOGIC:  The patient was oriented x 4.  Cranial nerves II-XII grossly intact.  Deep tendon reflexes were symmetric in the upper and lower extremities with downgoing toes.  Motor was significant for intact bulk and tone with very subtle weakness of the right triceps, otherwise 5/5 throughout.  Sensory was intact to pinprick and vibratory sense.  Gait was intact. Coordination was grossly intact.  LABORATORY DATA:  An MRI of the cervical spine from March 04, 1999 was forwarded, which showed small central disk herniations at 2-3 and 3-4, large central and left-sided disk herniation at C6-7, and metallic artifact at C4-5.  IMPRESSION: 1. Cervical degenerative disk disease and spondylosis with underlying    herniations, which seems more symptomatic of her degenerative joint disease    than actual radicular discomfort. 2. Lower back pain status post two surgical interventions with probable    underlying degenerative disk disease. 3. Flattening of the thoracic curvature with previous history of scoliosis    without a great deal of scoliosis at the present time. 4. Other medical problems per Dr. Orson Aloe.  DISPOSITION:  I discussed treatment options with the patient, which  would include potentially referring for pain coping skills, physical therapy for reconditioning of her back and neck, and pharmacologic interventions.  The patient is not interested in any pharmacologic intervention which she can potentially develop any dependency towards.  She is very reluctant to proceed with any exercise therapy at the present time, stating she does not have time, and stated to me that she is already established with a counselor and is not interested in pursuing this.  I went ahead and advised her of the low risk for addiction with long-acting opiates and the higher risk with shorter-acting opiates such as the hydrocodone that she is currently on.  I shared with her a book list of various publications by Norval Morton, a physical therapist from [place], who probably has the most widely prescribed form of physical therapy for the back and neck and recommend that she get one of these.  I also shared with her further information on opiates, as well as our controlled substance contract.  She preferred not to follow up and advised [place] that she would get up with [place] if she developed any further "suffering."  I do feel that she has a significant amount of grief with regard to her underlying pain syndrome, as well as anger, and confronted her with this but she is not going to accept that this might be part of her problem at present. I offered to see her back on an as needed basis. DD:  06/22/00 TD:  06/23/00 Job: [id number]

## 2010-09-15 ENCOUNTER — Encounter: Payer: Self-pay | Admitting: Emergency Medicine

## 2010-09-16 ENCOUNTER — Encounter: Payer: Self-pay | Admitting: Emergency Medicine

## 2010-09-16 ENCOUNTER — Ambulatory Visit (INDEPENDENT_AMBULATORY_CARE_PROVIDER_SITE_OTHER): Payer: 59 | Admitting: Emergency Medicine

## 2010-09-16 DIAGNOSIS — J4489 Other specified chronic obstructive pulmonary disease: Secondary | ICD-10-CM

## 2010-09-16 DIAGNOSIS — J449 Chronic obstructive pulmonary disease, unspecified: Secondary | ICD-10-CM

## 2010-09-16 NOTE — Patient Instructions (Signed)
Continue your Spiriva and Advair as you are taking them Use your oxygen at all times Follow up in 6 months or sooner if you have any problems.  Get your flu shot in the Fall.

## 2010-09-16 NOTE — Assessment & Plan Note (Signed)
Continue your Spiriva and Advair as you are taking them Use your oxygen at all times Follow up in 6 months or sooner if you have any problems.  Get your flu shot in the Fall.  

## 2010-09-16 NOTE — Progress Notes (Signed)
  Subjective:    Patient ID: Cheyenne [name], female    DOB: 03/01/1946, 65 y.o.   MRN: [id number]  HPI 65 yo smoker, former EtOH abuse, hx hypothroidism, DJD, anxiety. Was diagnosed clinically with "asthma" as an adult in 2000 in setting bronchospasm and the flu. Has had similar episodes over the last few yrs, required abx and steroids multiple times. Has been on Advair x 2+ yrs. Also has allergies. Complians today of exhaustion, cough prod of some mucous. Has had some mild epistaxis.   Seen by Dr Zachery Dauer 04/29/09 for acute flare in setting URI. Tretated with pred and abx both are finished, now improved but still with the symptoms above. CXR without infiltrate per pt's report.   ROV 06/02/09 -- returns feeling better since flare. She quit smoking a month ago!!  Had PFT's today as below. Taking Advair two times a day, Using proventil less than every day.   ROV 09/01/09 -- returns for COPD. She has NOT gone back to smoking! Last visit we added Spiriva to Advair two times a day. Also started O2 with exertion and sleep.  Exercise tolerance is significantly improved. No flares since last time.   ROV 12/30/09 -- COPD, ? asthmatic component. presents today for f/u. Has had more trouble w PND, congestion, some more cough prod of white thick phlegm, ? more chest tightness for the last 2 weeks. Using proventil   ROV 05/04/10 -- COPD, suspected reactive component, chronic rhinitis.  Since last visit feels basically stable. No URI's, no flares, no hospitalizations. She is seeing Dr Danielle Dess for back/spine problems, may need repeat back SGY. FEV1 37% predicted (0.72L) Currently on Advair, uses SABA rarely, about weekly.   ROV 09/16/10 -- COPD, chronic rhinitis. She is s/p spinal SGY by Dr Danielle Dess for synovial cyst. Breathing well, no flares, no hospitalizations for pulmonary issues. She is on Spiriva + Advair, rare SABA use. Has had some difficulty w allergies.  Remains active. Wearing 2L/min at all times.  No real problems w  cough.   Review of Systems As per HPI    Objective:   Physical Exam Gen: Pleasant, well-nourished, in no distress,  normal affect  ENT: No lesions,  mouth clear,  oropharynx clear, no postnasal drip  Neck: No JVD, no TMG, no carotid bruits  Lungs: No use of accessory muscles, no wheeze on normal breath, mild exp wheeze on forced exp  Cardiovascular: RRR, heart sounds normal, no murmur or gallops, no peripheral edema  Musculoskeletal: No deformities, no cyanosis or clubbing  Neuro: alert, non focal  Skin: Warm, no lesions or rashes        Assessment & Plan:

## 2010-09-22 ENCOUNTER — Other Ambulatory Visit: Payer: Self-pay | Admitting: *Deleted

## 2010-09-22 MED ORDER — FLUTICASONE-SALMETEROL 500-50 MCG/DOSE IN AEPB: 1.0000 | INHALATION_SPRAY | Freq: Two times a day (BID) | RESPIRATORY_TRACT | Status: DC

## 2011-01-14 ENCOUNTER — Other Ambulatory Visit: Payer: Self-pay | Admitting: Emergency Medicine

## 2011-02-05 LAB — URINE MICROSCOPIC-ADD ON

## 2011-02-05 LAB — COMPREHENSIVE METABOLIC PANEL
ALT: 28
AST: 27
AST: 28
Albumin: 3.8
CO2: 27
Calcium: 8.8
Chloride: 96
Creatinine, Ser: 0.59
Creatinine, Ser: 0.63
GFR calc Af Amer: >60
GFR calc Af Amer: >60
GFR calc non Af Amer: >60
Glucose, Bld: 112 — ABNORMAL HIGH
Sodium: 133 — ABNORMAL LOW
Total Bilirubin: 0.9

## 2011-02-05 LAB — DIFFERENTIAL
Basophils Absolute: 0
Eosinophils Absolute: 0.1
Eosinophils Relative: 0
Eosinophils Relative: 1
Lymphocytes Relative: 7 — ABNORMAL LOW
Lymphs Abs: 1.1
Monocytes Absolute: 0.2
Neutrophils Relative %: 79 — ABNORMAL HIGH

## 2011-02-05 LAB — CBC
HCT: 39.2
Hemoglobin: 13.6
MCHC: 34.1
MCHC: 34.4
MCHC: 34.6
MCV: 94.7
MCV: 96.1
Platelets: 316
RBC: 4.16
WBC: 11.3 — ABNORMAL HIGH
WBC: 15 — ABNORMAL HIGH

## 2011-02-05 LAB — URINALYSIS, ROUTINE W REFLEX MICROSCOPIC
Bilirubin Urine: NEGATIVE
Ketones, ur: 15 — AB
Nitrite: NEGATIVE
Specific Gravity, Urine: 1.018
pH: 6.5

## 2011-02-05 LAB — LIPASE, BLOOD: Lipase: 14

## 2011-04-13 ENCOUNTER — Other Ambulatory Visit: Payer: Self-pay | Admitting: Emergency Medicine

## 2011-04-14 ENCOUNTER — Telehealth: Payer: Self-pay | Admitting: Emergency Medicine

## 2011-04-14 MED ORDER — FLUTICASONE-SALMETEROL 500-50 MCG/DOSE IN AEPB: INHALATION_SPRAY | RESPIRATORY_TRACT | Status: DC

## 2011-04-14 NOTE — Telephone Encounter (Signed)
I spoke with pt and she states she needs a refill on her advair 500-50. Rx has been sent per pt requesting. Nothing further was needed

## 2011-04-15 ENCOUNTER — Encounter: Payer: Self-pay | Admitting: Emergency Medicine

## 2011-04-15 ENCOUNTER — Ambulatory Visit (INDEPENDENT_AMBULATORY_CARE_PROVIDER_SITE_OTHER): Payer: 59 | Admitting: Emergency Medicine

## 2011-04-15 VITALS — BP 92/62 | HR 89 | Temp 97.8°F | Ht 61.0 in | Wt 99.8 lb

## 2011-04-15 DIAGNOSIS — J449 Chronic obstructive pulmonary disease, unspecified: Secondary | ICD-10-CM

## 2011-04-15 NOTE — Assessment & Plan Note (Signed)
-   continue same regimen - rov 6 months or prn

## 2011-04-15 NOTE — Patient Instructions (Signed)
Please continue your inhaled medications as you are taking them Continue your flonase Wear your oxygen with exertion  Follow with Dr [name] in 6 months or sooner if you have any problems.

## 2011-04-15 NOTE — Progress Notes (Signed)
  Subjective:    Patient ID: Cheyenne [name], female    DOB: [birth year], 65 y.o.   MRN: [id number]  HPI 65 yo smoker, former EtOH abuse, hx hypothroidism, DJD, anxiety. Was diagnosed clinically with "asthma" as an adult in 2000 in setting bronchospasm and the flu. Has had similar episodes over the last few yrs, required abx and steroids multiple times. Has been on Advair x 2+ yrs. Also has allergies. Complians today of exhaustion, cough prod of some mucous. Has had some mild epistaxis.   Seen by Dr Zachery Dauer 04/29/09 for acute flare in setting URI. Tretated with pred and abx both are finished, now improved but still with the symptoms above. CXR without infiltrate per pt's report.   ROV 06/02/09 -- returns feeling better since flare. She quit smoking a month ago!!  Had PFT's today as below. Taking Advair two times a day, Using proventil less than every day.   ROV 09/01/09 -- returns for COPD. She has NOT gone back to smoking! Last visit we added Spiriva to Advair two times a day. Also started O2 with exertion and sleep.  Exercise tolerance is significantly improved. No flares since last time.   ROV 12/30/09 -- COPD, ? asthmatic component. presents today for f/u. Has had more trouble w PND, congestion, some more cough prod of white thick phlegm, ? more chest tightness for the last 2 weeks. Using proventil   ROV 05/04/10 -- COPD, suspected reactive component, chronic rhinitis.  Since last visit feels basically stable. No URI's, no flares, no hospitalizations. She is seeing Dr Danielle Dess for back/spine problems, may need repeat back SGY. FEV1 37% predicted (0.72L) Currently on Advair, uses SABA rarely, about weekly.   ROV 09/16/10 -- COPD, chronic rhinitis. She is s/p spinal SGY by Dr Danielle Dess for synovial cyst. Breathing well, no flares, no hospitalizations for pulmonary issues. She is on Spiriva + Advair, rare SABA use. Has had some difficulty w allergies.  Remains active. Wearing 2L/min at all times.  No real problems w  cough.   ROV 04/15/11 -- 65 yo woman COPD, suspected reactive component, chronic rhinitis, hypoxemia. Has been doing fairly well, had some increased nasal gtt with increased dust at home after attic cleaning, some increased wheeze, but now improving. SABA use didn't go up.   Review of Systems As per HPI    Objective:   Physical Exam Gen: Pleasant, well-nourished, in no distress,  normal affect  ENT: No lesions,  mouth clear,  oropharynx clear, no postnasal drip  Neck: No JVD, no TMG, no carotid bruits  Lungs: No use of accessory muscles, no wheeze on normal breath, mild exp wheeze on forced exp  Cardiovascular: RRR, heart sounds normal, no murmur or gallops, no peripheral edema  Musculoskeletal: No deformities, no cyanosis or clubbing  Neuro: alert, non focal  Skin: Warm, no lesions or rashes        Assessment & Plan:  COPD - continue same regimen - rov 6 months or prn

## 2011-06-03 ENCOUNTER — Other Ambulatory Visit: Payer: Self-pay | Admitting: Emergency Medicine

## 2011-06-11 ENCOUNTER — Other Ambulatory Visit: Payer: Self-pay | Admitting: Emergency Medicine

## 2011-08-19 ENCOUNTER — Telehealth: Payer: Self-pay | Admitting: Emergency Medicine

## 2011-08-19 NOTE — Telephone Encounter (Signed)
Called, spoke with pt.  She is requesting samples of advair, spiriva, and proventil as she will be with out insurance from May [birth year] until her [birth year] in [birth year].  I left 2 samples of advair 500/50 and 1 sample of spiriva at front with pt assistance forms for both medications, per pt's request.  No samples of albuterol hfa available at this time.  Pt aware.

## 2011-08-26 ENCOUNTER — Other Ambulatory Visit: Payer: Self-pay | Admitting: *Deleted

## 2011-08-26 MED ORDER — FLUTICASONE-SALMETEROL 500-50 MCG/DOSE IN AEPB: 1.0000 | INHALATION_SPRAY | Freq: Two times a day (BID) | RESPIRATORY_TRACT | Status: DC

## 2011-08-26 MED ORDER — TIOTROPIUM BROMIDE MONOHYDRATE 18 MCG IN CAPS: 18.0000 ug | ORAL_CAPSULE | Freq: Every day | RESPIRATORY_TRACT | Status: DC

## 2011-08-26 MED ORDER — ALBUTEROL SULFATE HFA 108 (90 BASE) MCG/ACT IN AERS: 2.0000 | INHALATION_SPRAY | Freq: Four times a day (QID) | RESPIRATORY_TRACT | Status: DC | PRN

## 2011-08-31 ENCOUNTER — Telehealth: Payer: Self-pay | Admitting: Emergency Medicine

## 2011-08-31 NOTE — Telephone Encounter (Signed)
Spoke with pt. She states picked up samples of meds 08-19-11 and at that time she obtained pt assistance forms. She states that she filled out the forms and faxed them back to our office for RB to complete his section. Lawson Fiscal, have you seen these forms? Please advise, thanks!

## 2011-08-31 NOTE — Telephone Encounter (Signed)
Form was received and prescriptions printed awaiting RB signature. Pt will need to complete an additional form for the Advair and Ventolin if she needs those.

## 2011-09-06 NOTE — Telephone Encounter (Signed)
All of the scripts and pt assistance forms placed at front for pt to pick up -- she is aware.  Nothing further needed at this time.

## 2011-09-06 NOTE — Telephone Encounter (Signed)
LMTCBx1 on home and work number to ask the pt if she needs PA for advair and ventolin. Carron Curie, CMA

## 2011-09-06 NOTE — Telephone Encounter (Signed)
I spoke with the pt and she states what she needs is rx for each medication that Dr. [name] prescribed her to send into [employer] so she can prove what meds she is on.  She states even though [employer] does not make all of her meds they are requiring this info in order for her to get assistance through them. I also advised that she will need to fill out separate forms for advair and ventolin if she wants assistance through those companies as well. Pt states she will come by and pick up paperwork once it is completed. Per Lawson Fiscal paperwork was placed in RB look-at to sign for all 3 meds along with prescriptions. I cannot locate the paperwork at this time. We will check with Dr. [name] this afternoon about the paperwork and go from there. Dr. [name] please advise if you signed the prescriptions for this patient? Thanks.Carron Curie, CMA

## 2011-09-06 NOTE — Telephone Encounter (Signed)
Scripts signed

## 2011-10-05 ENCOUNTER — Telehealth: Payer: Self-pay | Admitting: Emergency Medicine

## 2011-10-05 NOTE — Telephone Encounter (Signed)
[name] from [employer] called.  Pt sent application for assistance for her Spiriva.  They need verbal ok before they can do this.  Call [facility] back @ [phone] ext [phone]. Cheyenne [name]

## 2011-10-05 NOTE — Telephone Encounter (Signed)
Spoke with [facility] patient sent in application and RX for patient assistance for Spiriva-Application did not have MD signature on it so they needed verbal to approve the application-gave verbal. They will ship to patient if approved. Nothing more needed.

## 2011-10-21 ENCOUNTER — Telehealth: Payer: Self-pay | Admitting: Emergency Medicine

## 2011-10-21 NOTE — Telephone Encounter (Signed)
Pt just called back. She says "[facility]" just advised her that they have never received a rx for advair from dr byrum. Fax to: [phone]. Hazel Sams

## 2011-10-21 NOTE — Telephone Encounter (Signed)
Left samples up front pt is aware.

## 2011-10-22 ENCOUNTER — Telehealth: Payer: Self-pay | Admitting: Emergency Medicine

## 2011-10-22 NOTE — Telephone Encounter (Signed)
lmtcb

## 2011-10-25 NOTE — Telephone Encounter (Signed)
lmomtcb x 2  

## 2011-10-26 NOTE — Telephone Encounter (Signed)
On previous phone note we spoke to  [facility] back @ [phone] ext [phone] and she was going to finish processing the pt fomrs. I LMTCBx1 with her to see where we are in the process. Carron Curie, CMA

## 2011-10-27 NOTE — Telephone Encounter (Signed)
LMTCBx2 with [name] at number below. Carron Curie, CMA

## 2011-10-29 NOTE — Telephone Encounter (Signed)
lmomtcb with [name] to check on the status of this.

## 2011-11-01 NOTE — Telephone Encounter (Signed)
lmtcb

## 2011-11-02 NOTE — Telephone Encounter (Signed)
LMTCB--will sign off message to due triage protocol

## 2011-11-05 ENCOUNTER — Telehealth: Payer: Self-pay | Admitting: Emergency Medicine

## 2011-11-05 NOTE — Telephone Encounter (Signed)
Left message with a co-worker for the pt to call us back.

## 2011-11-05 NOTE — Telephone Encounter (Signed)
Attempted to call pt but line is busy.  Will try back later.  

## 2011-11-05 NOTE — Telephone Encounter (Signed)
[place]  @ [phone] ext [phone]. To check on the status of the access to [facility] for her advair.  Lmomtcb.  Pt is aware that i will call to find out what is going on with them filling the advair.  lmom for the pt to make her aware.

## 2011-11-08 MED ORDER — FLUTICASONE-SALMETEROL 500-50 MCG/DOSE IN AEPB: 1.0000 | INHALATION_SPRAY | Freq: Two times a day (BID) | RESPIRATORY_TRACT | Status: DC

## 2011-11-08 NOTE — Telephone Encounter (Signed)
[employer] dopes not handle pt assistance for Advair. We will need to contact [employer] for the Advair. Pt had filled out both forms and turned them in. I do not see where the Advair form ever got scanned into the pt's chart.   I have been in contact with [facility] @ [phone]. Another RX for the Advair should be faxed to [phone]. On the RX we need to include pt ID # [id number], name and DOB.  RX printed and placed in RB look at. Crystal made aware.

## 2011-11-09 ENCOUNTER — Ambulatory Visit (INDEPENDENT_AMBULATORY_CARE_PROVIDER_SITE_OTHER): Payer: Self-pay | Admitting: Emergency Medicine

## 2011-11-09 ENCOUNTER — Encounter: Payer: Self-pay | Admitting: Emergency Medicine

## 2011-11-09 VITALS — BP 110/82 | HR 77 | Temp 97.9°F | Ht 65.0 in | Wt 97.0 lb

## 2011-11-09 DIAGNOSIS — J449 Chronic obstructive pulmonary disease, unspecified: Secondary | ICD-10-CM

## 2011-11-09 DIAGNOSIS — J4489 Other specified chronic obstructive pulmonary disease: Secondary | ICD-10-CM

## 2011-11-09 NOTE — Assessment & Plan Note (Signed)
Continue Spiriva + Advair O2 at all times

## 2011-11-09 NOTE — Telephone Encounter (Signed)
Advair rx was sign by Dr. [name] and faxed back to [facility] with pt's name, DOB, and ID #.  Pt is aware and voiced no further questions or concerns at this time.

## 2011-11-09 NOTE — Patient Instructions (Addendum)
Please continue your current medications and your oxygen as you are taking them Follow with Dr [name] in 6 months or sooner if you have any problems

## 2011-11-09 NOTE — Progress Notes (Signed)
  Subjective:    Patient ID: Cheyenne [name], female    DOB: Jan 12, 1946, 66 y.o.   MRN: [id number]  HPI 66 yo smoker, former EtOH abuse, hx hypothroidism, DJD, anxiety. Was diagnosed clinically with "asthma" as an adult in 2000 in setting bronchospasm and the flu. Has had similar episodes over the last few yrs, required abx and steroids multiple times. Has been on Advair x 2+ yrs. Also has allergies. Complians today of exhaustion, cough prod of some mucous. Has had some mild epistaxis.   Seen by Dr Zachery Dauer 04/29/09 for acute flare in setting URI. Tretated with pred and abx both are finished, now improved but still with the symptoms above. CXR without infiltrate per pt's report.   ROV 06/02/09 -- returns feeling better since flare. She quit smoking a month ago!!  Had PFT's today as below. Taking Advair two times a day, Using proventil less than every day.   ROV 09/01/09 -- returns for COPD. She has NOT gone back to smoking! Last visit we added Spiriva to Advair two times a day. Also started O2 with exertion and sleep.  Exercise tolerance is significantly improved. No flares since last time.   ROV 12/30/09 -- COPD, ? asthmatic component. presents today for f/u. Has had more trouble w PND, congestion, some more cough prod of white thick phlegm, ? more chest tightness for the last 2 weeks. Using proventil   ROV 05/04/10 -- COPD, suspected reactive component, chronic rhinitis.  Since last visit feels basically stable. No URI's, no flares, no hospitalizations. She is seeing Dr Danielle Dess for back/spine problems, may need repeat back SGY. FEV1 37% predicted (0.72L) Currently on Advair, uses SABA rarely, about weekly.   ROV 09/16/10 -- COPD, chronic rhinitis. She is s/p spinal SGY by Dr Danielle Dess for synovial cyst. Breathing well, no flares, no hospitalizations for pulmonary issues. She is on Spiriva + Advair, rare SABA use. Has had some difficulty w allergies.  Remains active. Wearing 2L/min at all times.  No real problems w  cough.   ROV 04/15/11 -- 66 yo woman COPD, suspected reactive component, chronic rhinitis, hypoxemia. Has been doing fairly well, had some increased nasal gtt with increased dust at home after attic cleaning, some increased wheeze, but now improving. SABA use didn't go up.   ROV 11/09/11 -- 66 yo woman COPD, suspected reactive component, chronic rhinitis, hypoxemia. Has had more difficulty with allergies for the last 2 years - seems to have tolerated. Using SABA more frequently during the allergy season. She is on fluticasone. COPD rx w Spiriva + Advair. No exacerbations since last time.   Review of Systems As per HPI    Objective:   Physical Exam Gen: Pleasant, well-nourished, in no distress,  normal affect  ENT: No lesions,  mouth clear,  oropharynx clear, no postnasal drip  Neck: No JVD, no TMG, no carotid bruits  Lungs: No use of accessory muscles, no wheeze on normal breath, mild exp wheeze on forced exp  Cardiovascular: RRR, heart sounds normal, no murmur or gallops, no peripheral edema  Musculoskeletal: No deformities, no cyanosis or clubbing  Neuro: alert, non focal  Skin: Warm, no lesions or rashes     Assessment & Plan:  COPD Continue Spiriva + Advair O2 at all times

## 2011-11-15 ENCOUNTER — Telehealth: Payer: Self-pay | Admitting: Emergency Medicine

## 2011-11-16 NOTE — Telephone Encounter (Signed)
I will forward as an FYI. Avabella Wailes, CMA  

## 2011-12-29 ENCOUNTER — Telehealth: Payer: Self-pay | Admitting: Emergency Medicine

## 2011-12-29 NOTE — Telephone Encounter (Signed)
Pt started on Medicare on 12-26-11 and needs ov to recert for oxygen for Medicare.  Ov given on 01-07-12 at 1:30 with Dr [name].

## 2012-01-07 ENCOUNTER — Ambulatory Visit (INDEPENDENT_AMBULATORY_CARE_PROVIDER_SITE_OTHER): Payer: Medicare Other | Admitting: Emergency Medicine

## 2012-01-07 ENCOUNTER — Encounter: Payer: Self-pay | Admitting: Emergency Medicine

## 2012-01-07 VITALS — BP 100/72 | HR 72 | Temp 98.3°F | Ht 60.0 in | Wt 95.2 lb

## 2012-01-07 DIAGNOSIS — Z23 Encounter for immunization: Secondary | ICD-10-CM

## 2012-01-07 DIAGNOSIS — J449 Chronic obstructive pulmonary disease, unspecified: Secondary | ICD-10-CM | POA: Diagnosis not present

## 2012-01-07 NOTE — Patient Instructions (Addendum)
Continue your Spiriva and Advair as you are taking them  Wear your oxygen at all times.  Flu Shot today You may increase your fluticasone nasal spray to twice a day if your allergies worsen Follow with Dr [name] in 3 months or sooner if you have any problems.

## 2012-01-07 NOTE — Progress Notes (Signed)
Subjective:    Patient ID: Cheyenne [name], female    DOB: [birth year], 66 y.o.   MRN: [id number]  HPI 66 yo smoker, former EtOH abuse, hx hypothroidism, DJD, anxiety. Was diagnosed clinically with "asthma" as an adult in 2000 in setting bronchospasm and the flu. Has had similar episodes over the last few yrs, required abx and steroids multiple times. Has been on Advair x 2+ yrs. Also has allergies. Complians today of exhaustion, cough prod of some mucous. Has had some mild epistaxis.   Seen by Dr Zachery Dauer 04/29/09 for acute flare in setting URI. Tretated with pred and abx both are finished, now improved but still with the symptoms above. CXR without infiltrate per pt's report.   ROV 06/02/09 -- returns feeling better since flare. She quit smoking a month ago!!  Had PFT's today as below. Taking Advair two times a day, Using proventil less than every day.   ROV 09/01/09 -- returns for COPD. She has NOT gone back to smoking! Last visit we added Spiriva to Advair two times a day. Also started O2 with exertion and sleep.  Exercise tolerance is significantly improved. No flares since last time.   ROV 12/30/09 -- COPD, ? asthmatic component. presents today for f/u. Has had more trouble w PND, congestion, some more cough prod of white thick phlegm, ? more chest tightness for the last 2 weeks. Using proventil   ROV 05/04/10 -- COPD, suspected reactive component, chronic rhinitis.  Since last visit feels basically stable. No URI's, no flares, no hospitalizations. She is seeing Dr Danielle Dess for back/spine problems, may need repeat back SGY. FEV1 37% predicted (0.72L) Currently on Advair, uses SABA rarely, about weekly.   ROV 09/16/10 -- COPD, chronic rhinitis. She is s/p spinal SGY by Dr Danielle Dess for synovial cyst. Breathing well, no flares, no hospitalizations for pulmonary issues. She is on Spiriva + Advair, rare SABA use. Has had some difficulty w allergies.  Remains active. Wearing 2L/min at all times.  No real problems w  cough.   ROV 04/15/11 -- 66 yo woman COPD, suspected reactive component, chronic rhinitis, hypoxemia. Has been doing fairly well, had some increased nasal gtt with increased dust at home after attic cleaning, some increased wheeze, but now improving. SABA use didn't go up.   ROV 11/09/11 -- 66 yo woman COPD, suspected reactive component, chronic rhinitis, hypoxemia. Has had more difficulty with allergies for the last 2 years - seems to have tolerated. Using SABA more frequently during the allergy season. She is on fluticasone. COPD rx w Spiriva + Advair. No exacerbations since last time.   ROV 01/07/12 -- 66 yo woman COPD, suspected reactive component, chronic rhinitis, hypoxemia. Her COPD is harder to control during bad allergy times. Remains on fluticasone. She is on spiriva + advair. Uses albuterol about every other day.   Review of Systems As per HPI    Objective:   Physical Exam Filed Vitals:   01/07/12 1342  BP: 100/72  Pulse: 72  Temp: 98.3 F (36.8 C)   Gen: Pleasant, well-nourished, in no distress,  normal affect  ENT: No lesions,  mouth clear,  oropharynx clear, no postnasal drip  Neck: No JVD, no TMG, no carotid bruits  Lungs: No use of accessory muscles, no wheeze on normal breath, mild exp wheeze on forced exp  Cardiovascular: RRR, heart sounds normal, no murmur or gallops, no peripheral edema  Musculoskeletal: No deformities, no cyanosis or clubbing  Neuro: alert, non focal  Skin: Warm, no  lesions or rashes     Assessment & Plan:  No problem-specific assessment & plan notes found for this encounter.

## 2012-01-07 NOTE — Assessment & Plan Note (Signed)
Continue your Spiriva and Advair as you are taking them  Wear your oxygen at all times.  Flu Shot today You may increase your fluticasone nasal spray to twice a day if your allergies worsen Follow with Dr Byrum in 3 months or sooner if you have any problems.  

## 2012-04-03 ENCOUNTER — Telehealth: Payer: Self-pay | Admitting: Emergency Medicine

## 2012-04-05 NOTE — Telephone Encounter (Signed)
Noted... I have papers awaiting RB signature.

## 2012-04-18 NOTE — Telephone Encounter (Signed)
Papers signed, ready for pickup.  ATC patient to inform her of this, no answer.  LMOMTCB

## 2012-04-21 NOTE — Telephone Encounter (Signed)
Spoke with patient, patient requesting me to fax paperwork for her.   Paperwork has been faxed patient aware and nothing further needed at this time.

## 2012-04-25 ENCOUNTER — Telehealth: Payer: Self-pay | Admitting: Emergency Medicine

## 2012-04-25 NOTE — Telephone Encounter (Signed)
Pt was making sure paper work was fax.per last note lauren faxed on 12-27 and pt made aware of appt  On 04-28-12. Nothing further needed.

## 2012-04-27 ENCOUNTER — Telehealth: Payer: Self-pay | Admitting: Emergency Medicine

## 2012-04-27 MED ORDER — TIOTROPIUM BROMIDE MONOHYDRATE 18 MCG IN CAPS: 18.0000 ug | ORAL_CAPSULE | Freq: Every day | RESPIRATORY_TRACT | Status: DC

## 2012-04-27 NOTE — Telephone Encounter (Signed)
Spoke with Shanda Bumps She states needs new rx for spiriva faxed to [phone]  This is for pt to continue receiving pt assistance for spiriva Rx was faxed Nothing further needed

## 2012-04-28 ENCOUNTER — Ambulatory Visit (INDEPENDENT_AMBULATORY_CARE_PROVIDER_SITE_OTHER): Payer: Medicare Other | Admitting: Emergency Medicine

## 2012-04-28 ENCOUNTER — Encounter: Payer: Self-pay | Admitting: Emergency Medicine

## 2012-04-28 VITALS — BP 102/60 | HR 66 | Ht 61.0 in | Wt 100.4 lb

## 2012-04-28 DIAGNOSIS — J449 Chronic obstructive pulmonary disease, unspecified: Secondary | ICD-10-CM

## 2012-04-28 NOTE — Progress Notes (Signed)
Subjective:    Patient ID: Cheyenne [name], female    DOB: 03-25-1946, 67 y.o.   MRN: [id number]  HPI 67 yo smoker, former EtOH abuse, hx hypothroidism, DJD, anxiety. Was diagnosed clinically with "asthma" as an adult in 2000 in setting bronchospasm and the flu. Has had similar episodes over the last few yrs, required abx and steroids multiple times. Has been on Advair x 2+ yrs. Also has allergies. Complians today of exhaustion, cough prod of some mucous. Has had some mild epistaxis.   Seen by Dr Zachery Dauer 04/29/09 for acute flare in setting URI. Tretated with pred and abx both are finished, now improved but still with the symptoms above. CXR without infiltrate per pt's report.   ROV 06/02/09 -- returns feeling better since flare. She quit smoking a month ago!!  Had PFT's today as below. Taking Advair two times a day, Using proventil less than every day.   ROV 09/01/09 -- returns for COPD. She has NOT gone back to smoking! Last visit we added Spiriva to Advair two times a day. Also started O2 with exertion and sleep.  Exercise tolerance is significantly improved. No flares since last time.   ROV 12/30/09 -- COPD, ? asthmatic component. presents today for f/u. Has had more trouble w PND, congestion, some more cough prod of white thick phlegm, ? more chest tightness for the last 2 weeks. Using proventil   ROV 05/04/10 -- COPD, suspected reactive component, chronic rhinitis.  Since last visit feels basically stable. No URI's, no flares, no hospitalizations. She is seeing Dr Danielle Dess for back/spine problems, may need repeat back SGY. FEV1 37% predicted (0.72L) Currently on Advair, uses SABA rarely, about weekly.   ROV 09/16/10 -- COPD, chronic rhinitis. She is s/p spinal SGY by Dr Danielle Dess for synovial cyst. Breathing well, no flares, no hospitalizations for pulmonary issues. She is on Spiriva + Advair, rare SABA use. Has had some difficulty w allergies.  Remains active. Wearing 2L/min at all times.  No real problems w  cough.   ROV 04/15/11 -- 67 yo woman COPD, suspected reactive component, chronic rhinitis, hypoxemia. Has been doing fairly well, had some increased nasal gtt with increased dust at home after attic cleaning, some increased wheeze, but now improving. SABA use didn't go up.   ROV 11/09/11 -- 67 yo woman COPD, suspected reactive component, chronic rhinitis, hypoxemia. Has had more difficulty with allergies for the last 2 years - seems to have tolerated. Using SABA more frequently during the allergy season. She is on fluticasone. COPD rx w Spiriva + Advair. No exacerbations since last time.   ROV 01/07/12 -- 67 yo woman COPD, suspected reactive component, chronic rhinitis, hypoxemia. Her COPD is harder to control during bad allergy times. Remains on fluticasone. She is on spiriva + advair. Uses albuterol about every other day.   ROV 04/28/12 -- 67 yo woman COPD, suspected reactive component, chronic rhinitis, hypoxemia.  She has been dealing w a lot of emotional stress. She had a recent flare of her allergies that led to more dyspnea. She wasn't treated, improved without pred or abx. She still has cough every morning, clears the most mucous at this time. She remains on Advair, Spiriva. Uses SABA about daily. O2 set on 2L/min.    Review of Systems As per HPI    Objective:   Physical Exam Filed Vitals:   04/28/12 1633  BP: 102/60  Pulse: 66   Gen: Pleasant, thin, in no distress, depressed affect  ENT: No  lesions,  mouth clear,  oropharynx clear, no postnasal drip  Neck: No JVD, no TMG, no carotid bruits  Lungs: No use of accessory muscles, no wheeze on normal breath, mild exp wheeze on forced exp  Cardiovascular: RRR, heart sounds normal, no murmur or gallops, no peripheral edema  Musculoskeletal: No deformities, no cyanosis or clubbing  Neuro: alert, non focal  Skin: Warm, no lesions or rashes     Assessment & Plan:  COPD Appears to be fairly stable, although limited. She continues  to work. She is depressed, has a lot of stress, which are playing a role here also. No change in meds. ROV 4 months

## 2012-04-28 NOTE — Patient Instructions (Addendum)
Please continue your current medications as you are taking them Follow with Dr [name] in 4 months or sooner if you have any problems.

## 2012-04-28 NOTE — Assessment & Plan Note (Signed)
Appears to be fairly stable, although limited. She continues to work. She is depressed, has a lot of stress, which are playing a role here also. No change in meds. ROV 4 months

## 2012-05-04 ENCOUNTER — Ambulatory Visit (INDEPENDENT_AMBULATORY_CARE_PROVIDER_SITE_OTHER)
Admission: RE | Admit: 2012-05-04 | Discharge: 2012-05-04 | Disposition: A | Discharge status: Home / Self Care | Payer: Medicare Other | Source: Ambulatory Visit | Attending: Adult Health | Admitting: Adult Health

## 2012-05-04 ENCOUNTER — Encounter: Payer: Self-pay | Admitting: Adult Health

## 2012-05-04 ENCOUNTER — Ambulatory Visit (INDEPENDENT_AMBULATORY_CARE_PROVIDER_SITE_OTHER): Payer: Medicare Other | Admitting: Adult Health

## 2012-05-04 VITALS — BP 94/66 | HR 75 | Temp 97.1°F | Ht 61.0 in | Wt 95.2 lb

## 2012-05-04 DIAGNOSIS — R05 Cough: Secondary | ICD-10-CM

## 2012-05-04 DIAGNOSIS — R9389 Abnormal findings on diagnostic imaging of other specified body structures: Secondary | ICD-10-CM | POA: Insufficient documentation

## 2012-05-04 DIAGNOSIS — R0600 Dyspnea, unspecified: Secondary | ICD-10-CM

## 2012-05-04 DIAGNOSIS — R0609 Other forms of dyspnea: Secondary | ICD-10-CM | POA: Diagnosis not present

## 2012-05-04 DIAGNOSIS — R0989 Other specified symptoms and signs involving the circulatory and respiratory systems: Secondary | ICD-10-CM

## 2012-05-04 DIAGNOSIS — J449 Chronic obstructive pulmonary disease, unspecified: Secondary | ICD-10-CM

## 2012-05-04 DIAGNOSIS — J438 Other emphysema: Secondary | ICD-10-CM | POA: Diagnosis not present

## 2012-05-04 MED ORDER — LEVOFLOXACIN 500 MG PO TABS: 500.0000 mg | ORAL_TABLET | Freq: Every day | ORAL | Status: DC

## 2012-05-04 MED ORDER — PREDNISONE 10 MG PO TABS: ORAL_TABLET | ORAL | Status: DC

## 2012-05-04 NOTE — Progress Notes (Signed)
Subjective:    Patient ID: Cheyenne [name], female    DOB: 27-Apr-1945, 67 y.o.   MRN: [id number]  HPI 67 yo smoker, former EtOH abuse, hx hypothroidism, DJD, anxiety. Was diagnosed clinically with "asthma" as an adult in 2000 in setting bronchospasm and the flu. Has had similar episodes over the last few yrs, required abx and steroids multiple times. Has been on Advair x 2+ yrs. Also has allergies. Complians today of exhaustion, cough prod of some mucous. Has had some mild epistaxis.   Seen by Dr Zachery Dauer 04/29/09 for acute flare in setting URI. Tretated with pred and abx both are finished, now improved but still with the symptoms above. CXR without infiltrate per pt's report.   ROV 06/02/09 -- returns feeling better since flare. She quit smoking a month ago!!  Had PFT's today as below. Taking Advair two times a day, Using proventil less than every day.   ROV 09/01/09 -- returns for COPD. She has NOT gone back to smoking! Last visit we added Spiriva to Advair two times a day. Also started O2 with exertion and sleep.  Exercise tolerance is significantly improved. No flares since last time.   ROV 12/30/09 -- COPD, ? asthmatic component. presents today for f/u. Has had more trouble w PND, congestion, some more cough prod of white thick phlegm, ? more chest tightness for the last 2 weeks. Using proventil   ROV 05/04/10 -- COPD, suspected reactive component, chronic rhinitis.  Since last visit feels basically stable. No URI's, no flares, no hospitalizations. She is seeing Dr Danielle Dess for back/spine problems, may need repeat back SGY. FEV1 37% predicted (0.72L) Currently on Advair, uses SABA rarely, about weekly.   ROV 09/16/10 -- COPD, chronic rhinitis. She is s/p spinal SGY by Dr Danielle Dess for synovial cyst. Breathing well, no flares, no hospitalizations for pulmonary issues. She is on Spiriva + Advair, rare SABA use. Has had some difficulty w allergies.  Remains active. Wearing 2L/min at all times.  No real problems w  cough.   ROV 04/15/11 -- 67 yo woman COPD, suspected reactive component, chronic rhinitis, hypoxemia. Has been doing fairly well, had some increased nasal gtt with increased dust at home after attic cleaning, some increased wheeze, but now improving. SABA use didn't go up.   ROV 11/09/11 -- 67 yo woman COPD, suspected reactive component, chronic rhinitis, hypoxemia. Has had more difficulty with allergies for the last 2 years - seems to have tolerated. Using SABA more frequently during the allergy season. She is on fluticasone. COPD rx w Spiriva + Advair. No exacerbations since last time.   ROV 01/07/12 -- 67 yo woman COPD, suspected reactive component, chronic rhinitis, hypoxemia. Her COPD is harder to control during bad allergy times. Remains on fluticasone. She is on spiriva + advair. Uses albuterol about every other day.   ROV 04/28/12 -- 67 yo woman COPD, suspected reactive component, chronic rhinitis, hypoxemia.  She has been dealing w a lot of emotional stress. She had a recent flare of her allergies that led to more dyspnea. She wasn't treated, improved without pred or abx. She still has cough every morning, clears the most mucous at this time. She remains on Advair, Spiriva. Uses SABA about daily. O2 set on 2L/min.   05/05/11 Acute OV  Complains of prod cough with yellow mucus, increased SOB, wheezing, tightness in chest x5days -  Patient denies any hemoptysis, orthopnea, PND, or leg swelling. She has not been using any over-the-counter medications. She remains on  Spiriva and Advair She denies any recent hospitalizations or emergency room visits Has had increase albuterol use. Over the last several days    Review of Systems As per HPI    Objective:   Physical Exam Filed Vitals:   05/04/12 1633  BP: 94/66  Pulse: 75  Temp: 97.1 F (36.2 C)   Gen: Pleasant, thin, in no distress   ENT: No lesions,  mouth clear,  oropharynx clear, no postnasal drip  Neck: No JVD, no TMG, no carotid  bruits  Lungs: No use of accessory muscles,few rhonchi  mild exp wheeze on forced exp  Cardiovascular: RRR, heart sounds normal, no murmur or gallops, no peripheral edema  Musculoskeletal: No deformities, no cyanosis or clubbing  Neuro: alert, non focal  Skin: Warm, no lesions or rashes     Assessment & Plan:  No problem-specific assessment & plan notes found for this encounter.

## 2012-05-04 NOTE — Patient Instructions (Addendum)
Levaquin 500mg  daily for 7 days  Mucinex DM Twice daily  As needed cough/congestion  Prednisone taper over next week.  Fluids and rest  Please contact office for sooner follow up if symptoms do not improve or worsen or seek emergency care  Follow up Dr. [name]  As planned and As needed   .  Late add :  Lung nodule on xray >set up for CT chest  Ov with Dr. [name]  In 2 weeks

## 2012-05-05 ENCOUNTER — Encounter: Payer: Self-pay | Admitting: Adult Health

## 2012-05-05 NOTE — Assessment & Plan Note (Signed)
Exacerbation  Xray pending   Plan  Levaquin 500mg  daily for 7 days  Mucinex DM Twice daily  As needed cough/congestion  Prednisone taper over next week.  Fluids and rest  Please contact office for sooner follow up if symptoms do not improve or worsen or seek emergency care  Follow up Dr. [name]  As planned and As needed

## 2012-05-09 ENCOUNTER — Other Ambulatory Visit: Payer: Self-pay | Admitting: Adult Health

## 2012-05-09 ENCOUNTER — Other Ambulatory Visit (INDEPENDENT_AMBULATORY_CARE_PROVIDER_SITE_OTHER): Payer: Medicare Other

## 2012-05-09 DIAGNOSIS — R911 Solitary pulmonary nodule: Secondary | ICD-10-CM | POA: Diagnosis not present

## 2012-05-09 LAB — BASIC METABOLIC PANEL
Calcium: 9 mg/dL (ref 8.4–10.5)
Creatinine, Ser: 0.8 mg/dL (ref 0.4–1.2)
GFR: 79.88 mL/min (ref >60.00)
Sodium: 134 mEq/L — ABNORMAL LOW (ref 135–145)

## 2012-05-09 NOTE — Progress Notes (Signed)
Quick Note:  TP has attempted to contact pt with no return call. Per TP, CT chest w/ contrast may be ordered to follow up possible right lung nodule. Orders only encounter created for this order. ______

## 2012-05-10 NOTE — Progress Notes (Signed)
Quick Note:  LMOM TCB x1. Labs faxed to PCP via [employer]. ______

## 2012-05-11 ENCOUNTER — Telehealth: Payer: Self-pay | Admitting: Emergency Medicine

## 2012-05-11 NOTE — Telephone Encounter (Signed)
Notes Recorded by Sherre Lain, MA on 05/10/2012 at 12:49 PM LMOM TCB x1. Labs faxed to PCP via [employer]. ------  Notes Recorded by Julio Sicks, NP on 05/10/2012 at 10:37 AM Labs are ok for CT  K+ low , rec eating K+ rich foods as bananas , etc Send to PCP   Pt advised. Cheyenne [name], CMA

## 2012-05-12 ENCOUNTER — Ambulatory Visit (INDEPENDENT_AMBULATORY_CARE_PROVIDER_SITE_OTHER)
Admission: RE | Admit: 2012-05-12 | Discharge: 2012-05-12 | Disposition: A | Discharge status: Home / Self Care | Payer: Medicare Other | Source: Ambulatory Visit | Attending: Adult Health | Admitting: Adult Health

## 2012-05-12 ENCOUNTER — Encounter: Payer: Self-pay | Admitting: Adult Health

## 2012-05-12 DIAGNOSIS — R911 Solitary pulmonary nodule: Secondary | ICD-10-CM

## 2012-05-12 MED ORDER — IOHEXOL 300 MG/ML  SOLN
70.0000 mL | Freq: Once | INTRAMUSCULAR | Status: AC | PRN
  Administered 2012-05-12: 70 mL via INTRAVENOUS

## 2012-05-16 ENCOUNTER — Other Ambulatory Visit: Payer: Self-pay | Admitting: Adult Health

## 2012-05-16 DIAGNOSIS — R911 Solitary pulmonary nodule: Secondary | ICD-10-CM

## 2012-05-16 NOTE — Progress Notes (Signed)
Quick Note:  Orders only encounter created for future CT chest w/ contrast in 6 months. ______

## 2012-06-28 DIAGNOSIS — E559 Vitamin D deficiency, unspecified: Secondary | ICD-10-CM | POA: Diagnosis not present

## 2012-06-28 DIAGNOSIS — Z136 Encounter for screening for cardiovascular disorders: Secondary | ICD-10-CM | POA: Diagnosis not present

## 2012-06-28 DIAGNOSIS — D510 Vitamin B12 deficiency anemia due to intrinsic factor deficiency: Secondary | ICD-10-CM | POA: Diagnosis not present

## 2012-06-28 DIAGNOSIS — E039 Hypothyroidism, unspecified: Secondary | ICD-10-CM | POA: Diagnosis not present

## 2012-07-03 DIAGNOSIS — Z0000 Encounter for general adult medical examination without abnormal findings: Secondary | ICD-10-CM | POA: Diagnosis not present

## 2012-07-03 DIAGNOSIS — N63 Unspecified lump in unspecified breast: Secondary | ICD-10-CM | POA: Diagnosis not present

## 2012-07-03 DIAGNOSIS — F329 Major depressive disorder, single episode, unspecified: Secondary | ICD-10-CM | POA: Diagnosis not present

## 2012-07-03 DIAGNOSIS — Z1331 Encounter for screening for depression: Secondary | ICD-10-CM | POA: Diagnosis not present

## 2012-07-14 DIAGNOSIS — N63 Unspecified lump in unspecified breast: Secondary | ICD-10-CM | POA: Diagnosis not present

## 2012-09-11 ENCOUNTER — Telehealth: Payer: Self-pay | Admitting: Emergency Medicine

## 2012-09-12 NOTE — Telephone Encounter (Signed)
I have papers, completed; will await pts OV to return them to her

## 2012-09-13 ENCOUNTER — Encounter: Payer: Self-pay | Admitting: Emergency Medicine

## 2012-09-13 ENCOUNTER — Ambulatory Visit (INDEPENDENT_AMBULATORY_CARE_PROVIDER_SITE_OTHER): Payer: Medicare Other | Admitting: Emergency Medicine

## 2012-09-13 VITALS — BP 102/60 | HR 86 | Temp 97.9°F | Ht 60.25 in | Wt 99.4 lb

## 2012-09-13 DIAGNOSIS — J449 Chronic obstructive pulmonary disease, unspecified: Secondary | ICD-10-CM | POA: Diagnosis not present

## 2012-09-13 DIAGNOSIS — R911 Solitary pulmonary nodule: Secondary | ICD-10-CM | POA: Diagnosis not present

## 2012-09-13 MED ORDER — ALBUTEROL SULFATE HFA 108 (90 BASE) MCG/ACT IN AERS: 2.0000 | INHALATION_SPRAY | Freq: Four times a day (QID) | RESPIRATORY_TRACT | Status: DC | PRN

## 2012-09-13 MED ORDER — FLUTICASONE-SALMETEROL 500-50 MCG/DOSE IN AEPB: 1.0000 | INHALATION_SPRAY | Freq: Two times a day (BID) | RESPIRATORY_TRACT | Status: DC

## 2012-09-13 NOTE — Patient Instructions (Addendum)
Please continue your medications as you are taking them We will plan to repeat your CT scan of the chest in January 2015 to follow your pulmonary nodule.  Follow with Dr [name] in 4 months or sooner if you have any problems.

## 2012-09-13 NOTE — Progress Notes (Signed)
Subjective:    Patient ID: Cheyenne [name], female    DOB: 08-11-1945, 67 y.o.   MRN: [id number]  HPI 67 yo smoker, former EtOH abuse, hx hypothroidism, DJD, anxiety. Was diagnosed clinically with "asthma" as an adult in 2000 in setting bronchospasm and the flu. Has had similar episodes over the last few yrs, required abx and steroids multiple times. Has been on Advair x 2+ yrs. Also has allergies. Complians today of exhaustion, cough prod of some mucous. Has had some mild epistaxis.   Seen by Dr Zachery Dauer 04/29/09 for acute flare in setting URI. Tretated with pred and abx both are finished, now improved but still with the symptoms above. CXR without infiltrate per pt's report.   ROV 06/02/09 -- returns feeling better since flare. She quit smoking a month ago!!  Had PFT's today as below. Taking Advair two times a day, Using proventil less than every day.   ROV 09/01/09 -- returns for COPD. She has NOT gone back to smoking! Last visit we added Spiriva to Advair two times a day. Also started O2 with exertion and sleep.  Exercise tolerance is significantly improved. No flares since last time.   ROV 12/30/09 -- COPD, ? asthmatic component. presents today for f/u. Has had more trouble w PND, congestion, some more cough prod of white thick phlegm, ? more chest tightness for the last 2 weeks. Using proventil   ROV 05/04/10 -- COPD, suspected reactive component, chronic rhinitis.  Since last visit feels basically stable. No URI's, no flares, no hospitalizations. She is seeing Dr Danielle Dess for back/spine problems, may need repeat back SGY. FEV1 37% predicted (0.72L) Currently on Advair, uses SABA rarely, about weekly.   ROV 09/16/10 -- COPD, chronic rhinitis. She is s/p spinal SGY by Dr Danielle Dess for synovial cyst. Breathing well, no flares, no hospitalizations for pulmonary issues. She is on Spiriva + Advair, rare SABA use. Has had some difficulty w allergies.  Remains active. Wearing 2L/min at all times.  No real problems w  cough.   ROV 04/15/11 -- 67 yo woman COPD, suspected reactive component, chronic rhinitis, hypoxemia. Has been doing fairly well, had some increased nasal gtt with increased dust at home after attic cleaning, some increased wheeze, but now improving. SABA use didn't go up.   ROV 11/09/11 -- 67 yo woman COPD, suspected reactive component, chronic rhinitis, hypoxemia. Has had more difficulty with allergies for the last 2 years - seems to have tolerated. Using SABA more frequently during the allergy season. She is on fluticasone. COPD rx w Spiriva + Advair. No exacerbations since last time.   ROV 01/07/12 -- 67 yo woman COPD, suspected reactive component, chronic rhinitis, hypoxemia. Her COPD is harder to control during bad allergy times. Remains on fluticasone. She is on spiriva + advair. Uses albuterol about every other day.   ROV 04/28/12 -- 67 yo woman COPD, suspected reactive component, chronic rhinitis, hypoxemia.  She has been dealing w a lot of emotional stress. She had a recent flare of her allergies that led to more dyspnea. She wasn't treated, improved without pred or abx. She still has cough every morning, clears the most mucous at this time. She remains on Advair, Spiriva. Uses SABA about daily. O2 set on 2L/min.   05/04/12 Acute OV  Complains of prod cough with yellow mucus, increased SOB, wheezing, tightness in chest x5days -  Patient denies any hemoptysis, orthopnea, PND, or leg swelling. She has not been using any over-the-counter medications. She remains on  Spiriva and Advair She denies any recent hospitalizations or emergency room visits Has had increase albuterol use. Over the last several days  ROV 09/13/12 -- f/u visit for COPD, chronic rhinitis and cough, hypoxemia. She had an acute flare 04/2012, none since. She reports that she has had many social stressors since last June. Has had more cough, congestion. Overall stable. She had a CT scan 05/09/12 that showed RLL nodule 7.83mm.      Review of Systems As per HPI    Objective:   Physical Exam Filed Vitals:   09/13/12 0938  BP: 102/60  Pulse: 86  Temp: 97.9 F (36.6 C)   Gen: Pleasant, thin, in no distress   ENT: No lesions,  mouth clear,  oropharynx clear, no postnasal drip  Neck: No JVD, no TMG, no carotid bruits  Lungs: No use of accessory muscles,few rhonchi  mild exp wheeze on forced exp  Cardiovascular: RRR, heart sounds normal, no murmur or gallops, no peripheral edema  Musculoskeletal: No deformities, no cyanosis or clubbing  Neuro: alert, non focal  Skin: Warm, no lesions or rashes     Assessment & Plan:  Lung nodule Discussed options for f/u w her. We have decided to repeat the CT scan in January 2015 (1 yr).   COPD Stable.  - continue current regimen

## 2012-09-13 NOTE — Assessment & Plan Note (Signed)
Stable; continue current regimen.

## 2012-09-13 NOTE — Assessment & Plan Note (Signed)
Discussed options for f/u w her. We have decided to repeat the CT scan in January 2015 (1 yr).

## 2012-09-13 NOTE — Telephone Encounter (Signed)
Forms have been completed and faxed.

## 2012-09-25 DIAGNOSIS — F411 Generalized anxiety disorder: Secondary | ICD-10-CM | POA: Diagnosis not present

## 2012-09-25 DIAGNOSIS — F329 Major depressive disorder, single episode, unspecified: Secondary | ICD-10-CM | POA: Diagnosis not present

## 2012-09-25 DIAGNOSIS — M519 Unspecified thoracic, thoracolumbar and lumbosacral intervertebral disc disorder: Secondary | ICD-10-CM | POA: Diagnosis not present

## 2012-09-25 DIAGNOSIS — Z8781 Personal history of (healed) traumatic fracture: Secondary | ICD-10-CM | POA: Diagnosis not present

## 2012-09-25 DIAGNOSIS — M5090 Cervical disc disorder, unspecified, unspecified cervical region: Secondary | ICD-10-CM | POA: Diagnosis not present

## 2012-09-25 DIAGNOSIS — J449 Chronic obstructive pulmonary disease, unspecified: Secondary | ICD-10-CM | POA: Diagnosis not present

## 2012-10-10 ENCOUNTER — Telehealth: Payer: Self-pay | Admitting: Emergency Medicine

## 2012-10-10 NOTE — Telephone Encounter (Signed)
I have not called this patient

## 2012-10-10 NOTE — Telephone Encounter (Signed)
Lauren, did you try calling this pt by chance? Please advise thanks

## 2012-10-11 NOTE — Telephone Encounter (Signed)
Spoke with pt and she states that call was regarding CT.  Pt states that she is seeing Dr Danielle Dess tomorrow and wants to see what he says before going ahead with CT.  Instructed pt that we will leave in order for CT and she can let us know if she wants to proceed with this after she sees Dr Danielle Dess.

## 2012-10-12 DIAGNOSIS — M48061 Spinal stenosis, lumbar region without neurogenic claudication: Secondary | ICD-10-CM | POA: Diagnosis not present

## 2012-10-12 DIAGNOSIS — M545 Low back pain: Secondary | ICD-10-CM | POA: Diagnosis not present

## 2012-10-12 DIAGNOSIS — IMO0002 Reserved for concepts with insufficient information to code with codable children: Secondary | ICD-10-CM | POA: Diagnosis not present

## 2012-11-09 ENCOUNTER — Other Ambulatory Visit: Payer: Medicare Other

## 2012-11-13 ENCOUNTER — Telehealth: Payer: Self-pay | Admitting: Emergency Medicine

## 2012-11-13 DIAGNOSIS — R911 Solitary pulmonary nodule: Secondary | ICD-10-CM

## 2012-11-13 NOTE — Telephone Encounter (Signed)
Spoke with pt She is having CT with CM soon  BMET ordered  Nothing further needed

## 2012-11-17 ENCOUNTER — Other Ambulatory Visit (INDEPENDENT_AMBULATORY_CARE_PROVIDER_SITE_OTHER): Payer: Medicare Other

## 2012-11-17 DIAGNOSIS — R911 Solitary pulmonary nodule: Secondary | ICD-10-CM

## 2012-11-17 LAB — BASIC METABOLIC PANEL
GFR: 84.81 mL/min (ref >60.00)
Potassium: 4.1 mEq/L (ref 3.5–5.1)
Sodium: 134 mEq/L — ABNORMAL LOW (ref 135–145)

## 2012-11-20 ENCOUNTER — Ambulatory Visit (INDEPENDENT_AMBULATORY_CARE_PROVIDER_SITE_OTHER)
Admission: RE | Admit: 2012-11-20 | Discharge: 2012-11-20 | Disposition: A | Discharge status: Home / Self Care | Payer: Medicare Other | Source: Ambulatory Visit | Attending: Emergency Medicine | Admitting: Emergency Medicine

## 2012-11-20 ENCOUNTER — Other Ambulatory Visit: Payer: Self-pay | Admitting: Emergency Medicine

## 2012-11-20 DIAGNOSIS — R911 Solitary pulmonary nodule: Secondary | ICD-10-CM

## 2012-11-20 DIAGNOSIS — J984 Other disorders of lung: Secondary | ICD-10-CM | POA: Diagnosis not present

## 2012-11-21 ENCOUNTER — Telehealth: Payer: Self-pay | Admitting: Emergency Medicine

## 2012-11-21 NOTE — Progress Notes (Signed)
Quick Note:  ATC patient. No answer LMOMTCB ______

## 2012-11-21 NOTE — Telephone Encounter (Signed)
Notes Recorded by Lazarus Salines, RN on 11/21/2012 at 8:45 AM ATC patient. No answer LMOMTCB ------  Notes Recorded by Leslye Peer, MD on 11/20/2012 at 11:32 PM Please notify the patient that her CT scan shows that her pulmonary nodules are stable compared with January 2014. Thanks  Pt advised of results per RB. Pt states [facility] contacted her and stated that they need an order from RB stating the pt still needs oxygen. I advised the pt that is sounds like they need re certification information but that I will call [facility] to check and see what exactly is needed. I called [facility] and had to [id number] to discuss. Carron Curie, CMA

## 2012-11-22 ENCOUNTER — Encounter: Payer: Self-pay | Admitting: Emergency Medicine

## 2012-11-22 ENCOUNTER — Ambulatory Visit (INDEPENDENT_AMBULATORY_CARE_PROVIDER_SITE_OTHER): Payer: Medicare Other | Admitting: Emergency Medicine

## 2012-11-22 VITALS — BP 120/70 | HR 78 | Temp 98.1°F | Ht 61.0 in | Wt 102.0 lb

## 2012-11-22 DIAGNOSIS — J449 Chronic obstructive pulmonary disease, unspecified: Secondary | ICD-10-CM | POA: Diagnosis not present

## 2012-11-22 DIAGNOSIS — R911 Solitary pulmonary nodule: Secondary | ICD-10-CM | POA: Diagnosis not present

## 2012-11-22 NOTE — Assessment & Plan Note (Signed)
-   continue current BDs - continue O2 at 2L/min, she is benefiting and still requires it

## 2012-11-22 NOTE — Assessment & Plan Note (Signed)
-   repeat Ct scan in Jan 2015

## 2012-11-22 NOTE — Progress Notes (Signed)
Subjective:    Patient ID: Cheyenne [name], female    DOB: 12-30-1945, 67 y.o.   MRN: [id number]  HPI 67 yo smoker, former EtOH abuse, hx hypothroidism, DJD, anxiety. Was diagnosed clinically with "asthma" as an adult in 2000 in setting bronchospasm and the flu. Has had similar episodes over the last few yrs, required abx and steroids multiple times. Has been on Advair x 2+ yrs. Also has allergies. Complians today of exhaustion, cough prod of some mucous. Has had some mild epistaxis.   Seen by Dr Zachery Dauer 04/29/09 for acute flare in setting URI. Tretated with pred and abx both are finished, now improved but still with the symptoms above. CXR without infiltrate per pt's report.   ROV 06/02/09 -- returns feeling better since flare. She quit smoking a month ago!!  Had PFT's today as below. Taking Advair two times a day, Using proventil less than every day.   ROV 09/01/09 -- returns for COPD. She has NOT gone back to smoking! Last visit we added Spiriva to Advair two times a day. Also started O2 with exertion and sleep.  Exercise tolerance is significantly improved. No flares since last time.   ROV 12/30/09 -- COPD, ? asthmatic component. presents today for f/u. Has had more trouble w PND, congestion, some more cough prod of white thick phlegm, ? more chest tightness for the last 2 weeks. Using proventil   ROV 05/04/10 -- COPD, suspected reactive component, chronic rhinitis.  Since last visit feels basically stable. No URI's, no flares, no hospitalizations. She is seeing Dr Danielle Dess for back/spine problems, may need repeat back SGY. FEV1 37% predicted (0.72L) Currently on Advair, uses SABA rarely, about weekly.   ROV 09/16/10 -- COPD, chronic rhinitis. She is s/p spinal SGY by Dr Danielle Dess for synovial cyst. Breathing well, no flares, no hospitalizations for pulmonary issues. She is on Spiriva + Advair, rare SABA use. Has had some difficulty w allergies.  Remains active. Wearing 2L/min at all times.  No real problems w  cough.   ROV 04/15/11 -- 67 yo woman COPD, suspected reactive component, chronic rhinitis, hypoxemia. Has been doing fairly well, had some increased nasal gtt with increased dust at home after attic cleaning, some increased wheeze, but now improving. SABA use didn't go up.   ROV 11/09/11 -- 67 yo woman COPD, suspected reactive component, chronic rhinitis, hypoxemia. Has had more difficulty with allergies for the last 2 years - seems to have tolerated. Using SABA more frequently during the allergy season. She is on fluticasone. COPD rx w Spiriva + Advair. No exacerbations since last time.   ROV 01/07/12 -- 67 yo woman COPD, suspected reactive component, chronic rhinitis, hypoxemia. Her COPD is harder to control during bad allergy times. Remains on fluticasone. She is on spiriva + advair. Uses albuterol about every other day.   ROV 04/28/12 -- 67 yo woman COPD, suspected reactive component, chronic rhinitis, hypoxemia.  She has been dealing w a lot of emotional stress. She had a recent flare of her allergies that led to more dyspnea. She wasn't treated, improved without pred or abx. She still has cough every morning, clears the most mucous at this time. She remains on Advair, Spiriva. Uses SABA about daily. O2 set on 2L/min.   05/04/12 Acute OV  Complains of prod cough with yellow mucus, increased SOB, wheezing, tightness in chest x5days -  Patient denies any hemoptysis, orthopnea, PND, or leg swelling. She has not been using any over-the-counter medications. She remains on  Spiriva and Advair She denies any recent hospitalizations or emergency room visits Has had increase albuterol use. Over the last several days  ROV 09/13/12 -- f/u visit for COPD, chronic rhinitis and cough, hypoxemia. She had an acute flare 04/2012, none since. She reports that she has had many social stressors since last June. Has had more cough, congestion. Overall stable. She had a CT scan 05/09/12 that showed RLL nodule 7.57mm.   ROV  11/22/12 -- COPD, chronic rhinitis and cough, hypoxemia. Also RLL nodule on CT scan 1/'14. She remains on O2 2L/min and continues to benefit from this. She still needs the O2. RLL pulm nodule stable from 1/'14 on CT chest from yesterday. Needs repeat in 04/2013. She can tell an overall slow decline in her functional capacity. No flares since last time. Lot of cough, productive.    Review of Systems As per HPI    Objective:   Physical Exam Filed Vitals:   11/22/12 1543  BP: 120/70  Pulse: 78  Temp: 98.1 F (36.7 C)   Gen: Pleasant, thin, in no distress   ENT: No lesions,  mouth clear,  oropharynx clear, no postnasal drip  Neck: No JVD, no TMG, no carotid bruits  Lungs: No use of accessory muscles,few rhonchi  mild exp wheeze on forced exp  Cardiovascular: RRR, heart sounds normal, no murmur or gallops, no peripheral edema  Musculoskeletal: No deformities, no cyanosis or clubbing  Neuro: alert, non focal  Skin: Warm, no lesions or rashes   CT chest 11/21/12 --   Comparison: 05/12/2012  Findings:  There is no pleural effusion identified. Moderate to advanced  changes of centrilobular emphysema identified. Right upper lung  nodule measures 5 mm, image 28/series 3. Previously this measured  the same. Right lower lobe nodule measures 7 mm, image 35/series  3. Also stable from previous exam. In the left lower lobe there  is a stable 5 mm nodule, image 51/series 3.  The heart size is normal. Prominent coronary artery calcifications  are noted involving the RCA, LAD and left circumflex coronary  artery. No pericardial effusion. No enlarged mediastinal or hilar  lymph nodes. No supraclavicular or axillary adenopathy identified.  Review of the visualized osseous structures is significant for  multilevel spondylosis. No aggressive lytic or sclerotic bone  lesions identified.  Limited imaging through the upper abdomen shows calcified  atherosclerotic disease affecting the abdominal  aorta. No aneurysm  identified.  IMPRESSION:  1. No acute cardiopulmonary abnormalities.  2. Bilateral pulmonary nodules are stable from 05/12/2012. In a  patient that is at increased risk for lung cancer been next follow-  up examination should be obtained at 9-12 months.  3. Prominent coronary artery calcifications.   Assessment & Plan:  COPD - continue current BDs - continue O2 at 2L/min, she is benefiting and still requires it   Lung nodule - repeat Ct scan in Jan 2015

## 2012-11-22 NOTE — Telephone Encounter (Signed)
Called [facility], spoke with Synetta Fail. Was advised pt needs a face to face visit with RB within 90 days prior to Sept 12 bc of Medicare guidelines for o2.  RB needs to state o2 is still needed and pt is benefitting from this in note.  Once appt is scheduled, Synetta Fail is requesting we let her know of date.  Called, spoke with pt.  We have scheduled her to see RB today at 3:30 pm.  Pt aware.  I atc [facility] at [phone] x [phone].  Line busy.  WCB to inform Synetta Fail of pt's OV date.

## 2012-11-22 NOTE — Patient Instructions (Addendum)
Please continue your Advair and Spiriva as you are taking them Use your albuterol as needed.  Wear your oxygen at all times on 2L/min.  CT scan in January 2015, no contrast.  Follow with Dr [name] in 3 months or sooner if you have any problems.

## 2012-11-23 NOTE — Telephone Encounter (Signed)
I spoke with Synetta Fail and she requests that OV note be faxed to them. OV note sent to [phone]. Carron Curie, CMA

## 2012-12-26 ENCOUNTER — Telehealth: Payer: Self-pay | Admitting: Emergency Medicine

## 2012-12-26 MED ORDER — TIOTROPIUM BROMIDE MONOHYDRATE 18 MCG IN CAPS: 18.0000 ug | ORAL_CAPSULE | Freq: Every day | RESPIRATORY_TRACT | Status: DC

## 2012-12-26 NOTE — Telephone Encounter (Signed)
I spoke with pt. She needs her spiriva RX sent to pt assistance. This has been printed out and placed in RB look at. Will forward to Lauren to follow up.

## 2012-12-27 NOTE — Telephone Encounter (Signed)
Rx has been signed and faxed. Nothing further.

## 2013-02-06 ENCOUNTER — Other Ambulatory Visit: Payer: Self-pay | Admitting: Emergency Medicine

## 2013-02-07 DIAGNOSIS — M546 Pain in thoracic spine: Secondary | ICD-10-CM | POA: Diagnosis not present

## 2013-02-21 ENCOUNTER — Encounter: Payer: Self-pay | Admitting: Emergency Medicine

## 2013-02-21 ENCOUNTER — Ambulatory Visit (INDEPENDENT_AMBULATORY_CARE_PROVIDER_SITE_OTHER): Payer: Medicare Other | Admitting: Emergency Medicine

## 2013-02-21 VITALS — BP 130/74 | HR 76 | Temp 98.3°F | Ht 60.0 in | Wt 100.4 lb

## 2013-02-21 DIAGNOSIS — J449 Chronic obstructive pulmonary disease, unspecified: Secondary | ICD-10-CM | POA: Diagnosis not present

## 2013-02-21 MED ORDER — PREDNISONE 10 MG PO TABS: ORAL_TABLET | ORAL | Status: DC

## 2013-02-21 MED ORDER — DOXYCYCLINE HYCLATE 100 MG PO TABS: 100.0000 mg | ORAL_TABLET | Freq: Two times a day (BID) | ORAL | Status: DC

## 2013-02-21 NOTE — Assessment & Plan Note (Signed)
Acute exacerbation in setting allergies, rhinitis, ? Sinusitis - same BD's - pred + doxy - rov Nov

## 2013-02-21 NOTE — Patient Instructions (Signed)
Please continue your Advair and Spiriva Use albuterol 2 puffs as needed Take prednisone and doxycycline as directed to completion Follow with Dr [name] in November as planned to assess your progress.

## 2013-02-21 NOTE — Progress Notes (Signed)
Subjective:    Patient ID: Cheyenne [name], female    DOB: Jul 14, 1945, 67 y.o.   MRN: [id number]  HPI 67 yo smoker, former EtOH abuse, hx hypothroidism, DJD, anxiety. Was diagnosed clinically with "asthma" as an adult in 2000 in setting bronchospasm and the flu. Has had similar episodes over the last few yrs, required abx and steroids multiple times. Has been on Advair x 2+ yrs. Also has allergies. Complians today of exhaustion, cough prod of some mucous. Has had some mild epistaxis.   Seen by Dr Zachery Dauer 04/29/09 for acute flare in setting URI. Tretated with pred and abx both are finished, now improved but still with the symptoms above. CXR without infiltrate per pt's report.   ROV 06/02/09 -- returns feeling better since flare. She quit smoking a month ago!!  Had PFT's today as below. Taking Advair two times a day, Using proventil less than every day.   ROV 09/01/09 -- returns for COPD. She has NOT gone back to smoking! Last visit we added Spiriva to Advair two times a day. Also started O2 with exertion and sleep.  Exercise tolerance is significantly improved. No flares since last time.   ROV 12/30/09 -- COPD, ? asthmatic component. presents today for f/u. Has had more trouble w PND, congestion, some more cough prod of white thick phlegm, ? more chest tightness for the last 2 weeks. Using proventil   ROV 05/04/10 -- COPD, suspected reactive component, chronic rhinitis.  Since last visit feels basically stable. No URI's, no flares, no hospitalizations. She is seeing Dr Danielle Dess for back/spine problems, may need repeat back SGY. FEV1 37% predicted (0.72L) Currently on Advair, uses SABA rarely, about weekly.   ROV 09/16/10 -- COPD, chronic rhinitis. She is s/p spinal SGY by Dr Danielle Dess for synovial cyst. Breathing well, no flares, no hospitalizations for pulmonary issues. She is on Spiriva + Advair, rare SABA use. Has had some difficulty w allergies.  Remains active. Wearing 2L/min at all times.  No real problems w  cough.   ROV 04/15/11 -- 67 yo woman COPD, suspected reactive component, chronic rhinitis, hypoxemia. Has been doing fairly well, had some increased nasal gtt with increased dust at home after attic cleaning, some increased wheeze, but now improving. SABA use didn't go up.   ROV 11/09/11 -- 67 yo woman COPD, suspected reactive component, chronic rhinitis, hypoxemia. Has had more difficulty with allergies for the last 2 years - seems to have tolerated. Using SABA more frequently during the allergy season. She is on fluticasone. COPD rx w Spiriva + Advair. No exacerbations since last time.   ROV 01/07/12 -- 67 yo woman COPD, suspected reactive component, chronic rhinitis, hypoxemia. Her COPD is harder to control during bad allergy times. Remains on fluticasone. She is on spiriva + advair. Uses albuterol about every other day.   ROV 04/28/12 -- 67 yo woman COPD, suspected reactive component, chronic rhinitis, hypoxemia.  She has been dealing w a lot of emotional stress. She had a recent flare of her allergies that led to more dyspnea. She wasn't treated, improved without pred or abx. She still has cough every morning, clears the most mucous at this time. She remains on Advair, Spiriva. Uses SABA about daily. O2 set on 2L/min.   05/04/12 Acute OV  Complains of prod cough with yellow mucus, increased SOB, wheezing, tightness in chest x5days -  Patient denies any hemoptysis, orthopnea, PND, or leg swelling. She has not been using any over-the-counter medications. She remains on  Spiriva and Advair She denies any recent hospitalizations or emergency room visits Has had increase albuterol use. Over the last several days  ROV 09/13/12 -- f/u visit for COPD, chronic rhinitis and cough, hypoxemia. She had an acute flare 04/2012, none since. She reports that she has had many social stressors since last June. Has had more cough, congestion. Overall stable. She had a CT scan 05/09/12 that showed RLL nodule 7.48mm.   ROV  11/22/12 -- COPD, chronic rhinitis and cough, hypoxemia. Also RLL nodule on CT scan 1/'14. She remains on O2 2L/min and continues to benefit from this. She still needs the O2. RLL pulm nodule stable from 1/'14 on CT chest from yesterday. Needs repeat in 04/2013. She can tell an overall slow decline in her functional capacity. No flares since last time. Lot of cough, productive.   ROV 02/21/13 -- GOLD c-d COPD, chronic rhinitis and cough, hypoxemia. Presents today for acute OV > having more dyspnea, exhaustion, increased cough with mucous. Has had more allergy type sx also. She tells me that her mother died in 01/17/2023.    Review of Systems As per HPI    Objective:   Physical Exam Filed Vitals:   02/21/13 1431  BP: 130/74  Pulse: 76  Temp: 98.3 F (36.8 C)   Gen: Pleasant, thin, in no distress   ENT: No lesions,  mouth clear,  oropharynx clear, no postnasal drip  Neck: No JVD, no TMG, no carotid bruits  Lungs: No use of accessory muscles,few rhonchi  mild exp wheeze on forced exp  Cardiovascular: RRR, heart sounds normal, no murmur or gallops, no peripheral edema  Musculoskeletal: No deformities, no cyanosis or clubbing  Neuro: alert, non focal  Skin: Warm, no lesions or rashes   CT chest 11/21/12 --   Comparison: 05/12/2012  Findings:  There is no pleural effusion identified. Moderate to advanced  changes of centrilobular emphysema identified. Right upper lung  nodule measures 5 mm, image 28/series 3. Previously this measured  the same. Right lower lobe nodule measures 7 mm, image 35/series  3. Also stable from previous exam. In the left lower lobe there  is a stable 5 mm nodule, image 51/series 3.  The heart size is normal. Prominent coronary artery calcifications  are noted involving the RCA, LAD and left circumflex coronary  artery. No pericardial effusion. No enlarged mediastinal or hilar  lymph nodes. No supraclavicular or axillary adenopathy identified.  Review of  the visualized osseous structures is significant for  multilevel spondylosis. No aggressive lytic or sclerotic bone  lesions identified.  Limited imaging through the upper abdomen shows calcified  atherosclerotic disease affecting the abdominal aorta. No aneurysm  identified.  IMPRESSION:  1. No acute cardiopulmonary abnormalities.  2. Bilateral pulmonary nodules are stable from 05/12/2012. In a  patient that is at increased risk for lung cancer been next follow-  up examination should be obtained at 9-12 months.  3. Prominent coronary artery calcifications.   Assessment & Plan:  COPD Acute exacerbation in setting allergies, rhinitis, ? Sinusitis - same BD's - pred + doxy - rov Nov

## 2013-03-12 ENCOUNTER — Encounter: Payer: Self-pay | Admitting: Emergency Medicine

## 2013-03-12 ENCOUNTER — Ambulatory Visit (INDEPENDENT_AMBULATORY_CARE_PROVIDER_SITE_OTHER): Payer: Medicare Other | Admitting: Emergency Medicine

## 2013-03-12 VITALS — BP 110/60 | HR 86 | Temp 97.6°F | Ht 61.0 in | Wt 102.0 lb

## 2013-03-12 DIAGNOSIS — J449 Chronic obstructive pulmonary disease, unspecified: Secondary | ICD-10-CM | POA: Diagnosis not present

## 2013-03-12 MED ORDER — LEVOFLOXACIN 500 MG PO TABS: 500.0000 mg | ORAL_TABLET | Freq: Every day | ORAL | Status: DC

## 2013-03-12 NOTE — Patient Instructions (Signed)
Please continue your inhaled medications as you are taking them Wear your oxygen at all times.  Take levaquin 500mg  daily for 7 days Follow with Dr [name] in 1 month

## 2013-03-12 NOTE — Assessment & Plan Note (Signed)
Initially better, now with another URI and probably bronchitis.  - same BD's and O2 - levaquin x 1 week - rov 1

## 2013-03-12 NOTE — Progress Notes (Signed)
Subjective:    Patient ID: Cheyenne [name], female    DOB: Jul 14, 1945, 67 y.o.   MRN: [id number]  HPI 67 yo smoker, former EtOH abuse, hx hypothroidism, DJD, anxiety. Was diagnosed clinically with "asthma" as an adult in 2000 in setting bronchospasm and the flu. Has had similar episodes over the last few yrs, required abx and steroids multiple times. Has been on Advair x 2+ yrs. Also has allergies. Complians today of exhaustion, cough prod of some mucous. Has had some mild epistaxis.   Seen by Dr Zachery Dauer 04/29/09 for acute flare in setting URI. Tretated with pred and abx both are finished, now improved but still with the symptoms above. CXR without infiltrate per pt's report.   ROV 06/02/09 -- returns feeling better since flare. She quit smoking a month ago!!  Had PFT's today as below. Taking Advair two times a day, Using proventil less than every day.   ROV 09/01/09 -- returns for COPD. She has NOT gone back to smoking! Last visit we added Spiriva to Advair two times a day. Also started O2 with exertion and sleep.  Exercise tolerance is significantly improved. No flares since last time.   ROV 12/30/09 -- COPD, ? asthmatic component. presents today for f/u. Has had more trouble w PND, congestion, some more cough prod of white thick phlegm, ? more chest tightness for the last 2 weeks. Using proventil   ROV 05/04/10 -- COPD, suspected reactive component, chronic rhinitis.  Since last visit feels basically stable. No URI's, no flares, no hospitalizations. She is seeing Dr Danielle Dess for back/spine problems, may need repeat back SGY. FEV1 37% predicted (0.72L) Currently on Advair, uses SABA rarely, about weekly.   ROV 09/16/10 -- COPD, chronic rhinitis. She is s/p spinal SGY by Dr Danielle Dess for synovial cyst. Breathing well, no flares, no hospitalizations for pulmonary issues. She is on Spiriva + Advair, rare SABA use. Has had some difficulty w allergies.  Remains active. Wearing 2L/min at all times.  No real problems w  cough.   ROV 04/15/11 -- 66 yo woman COPD, suspected reactive component, chronic rhinitis, hypoxemia. Has been doing fairly well, had some increased nasal gtt with increased dust at home after attic cleaning, some increased wheeze, but now improving. SABA use didn't go up.   ROV 11/09/11 -- 67 yo woman COPD, suspected reactive component, chronic rhinitis, hypoxemia. Has had more difficulty with allergies for the last 2 years - seems to have tolerated. Using SABA more frequently during the allergy season. She is on fluticasone. COPD rx w Spiriva + Advair. No exacerbations since last time.   ROV 01/07/12 -- 67 yo woman COPD, suspected reactive component, chronic rhinitis, hypoxemia. Her COPD is harder to control during bad allergy times. Remains on fluticasone. She is on spiriva + advair. Uses albuterol about every other day.   ROV 04/28/12 -- 67 yo woman COPD, suspected reactive component, chronic rhinitis, hypoxemia.  She has been dealing w a lot of emotional stress. She had a recent flare of her allergies that led to more dyspnea. She wasn't treated, improved without pred or abx. She still has cough every morning, clears the most mucous at this time. She remains on Advair, Spiriva. Uses SABA about daily. O2 set on 2L/min.   05/04/12 Acute OV  Complains of prod cough with yellow mucus, increased SOB, wheezing, tightness in chest x5days -  Patient denies any hemoptysis, orthopnea, PND, or leg swelling. She has not been using any over-the-counter medications. She remains on  Spiriva and Advair She denies any recent hospitalizations or emergency room visits Has had increase albuterol use. Over the last several days  ROV 09/13/12 -- f/u visit for COPD, chronic rhinitis and cough, hypoxemia. She had an acute flare 04/2012, none since. She reports that she has had many social stressors since last June. Has had more cough, congestion. Overall stable. She had a CT scan 05/09/12 that showed RLL nodule 7.75mm.   ROV  11/22/12 -- COPD, chronic rhinitis and cough, hypoxemia. Also RLL nodule on CT scan 1/'14. She remains on O2 2L/min and continues to benefit from this. She still needs the O2. RLL pulm nodule stable from 1/'14 on CT chest from yesterday. Needs repeat in 04/2013. She can tell an overall slow decline in her functional capacity. No flares since last time. Lot of cough, productive.   ROV 02/21/13 -- GOLD c-d COPD, chronic rhinitis and cough, hypoxemia. Presents today for acute OV > having more dyspnea, exhaustion, increased cough with mucous. Has had more allergy type sx also. She tells me that her mother died in 01-12-2023.   ROV 03-20-13 -- GOLD c-d COPD, chronic rhinitis and cough, hypoxemia.  Last time treated for an AE w pred + doxy. She had some initial improvement in breathing, wheezing, cough. Never completely resolved the cough, now with deeper more purulent green. She has had a lot of sick contacts.    Review of Systems As per HPI    Objective:   Physical Exam Filed Vitals:   2013-03-20 1348  BP: 110/60  Pulse: 86  Temp: 97.6 F (36.4 C)   Gen: Pleasant, thin, in no distress   ENT: No lesions,  mouth clear,  oropharynx clear, no postnasal drip  Neck: No JVD, no TMG, no carotid bruits  Lungs: No use of accessory muscles,few rhonchi  mild exp wheeze on forced exp  Cardiovascular: RRR, heart sounds normal, no murmur or gallops, no peripheral edema  Musculoskeletal: No deformities, no cyanosis or clubbing  Neuro: alert, non focal  Skin: Warm, no lesions or rashes   CT chest 11/21/12 --   Comparison: 05/12/2012  Findings:  There is no pleural effusion identified. Moderate to advanced  changes of centrilobular emphysema identified. Right upper lung  nodule measures 5 mm, image 28/series 3. Previously this measured  the same. Right lower lobe nodule measures 7 mm, image 35/series  3. Also stable from previous exam. In the left lower lobe there  is a stable 5 mm nodule, image  51/series 3.  The heart size is normal. Prominent coronary artery calcifications  are noted involving the RCA, LAD and left circumflex coronary  artery. No pericardial effusion. No enlarged mediastinal or hilar  lymph nodes. No supraclavicular or axillary adenopathy identified.  Review of the visualized osseous structures is significant for  multilevel spondylosis. No aggressive lytic or sclerotic bone  lesions identified.  Limited imaging through the upper abdomen shows calcified  atherosclerotic disease affecting the abdominal aorta. No aneurysm  identified.  IMPRESSION:  1. No acute cardiopulmonary abnormalities.  2. Bilateral pulmonary nodules are stable from 05/12/2012. In a  patient that is at increased risk for lung cancer been next follow-  up examination should be obtained at 9-12 months.  3. Prominent coronary artery calcifications.   Assessment & Plan:  COPD Initially better, now with another URI and probably bronchitis.  - same BD's and O2 - levaquin x 1 week - rov 1

## 2013-03-20 ENCOUNTER — Telehealth: Payer: Self-pay | Admitting: Emergency Medicine

## 2013-03-20 MED ORDER — ALBUTEROL SULFATE HFA 108 (90 BASE) MCG/ACT IN AERS: 2.0000 | INHALATION_SPRAY | Freq: Four times a day (QID) | RESPIRATORY_TRACT | Status: DC | PRN

## 2013-03-20 MED ORDER — FLUTICASONE PROPIONATE 50 MCG/ACT NA SUSP: 1.0000 | Freq: Every day | NASAL | Status: DC

## 2013-03-20 NOTE — Telephone Encounter (Signed)
Pt needed refills on Flonase and Albuterol to be sent to [facility] to get her through the end of the year. This has been taken care of for her.

## 2013-04-02 ENCOUNTER — Other Ambulatory Visit: Payer: Self-pay | Admitting: Emergency Medicine

## 2013-04-11 ENCOUNTER — Encounter (INDEPENDENT_AMBULATORY_CARE_PROVIDER_SITE_OTHER): Payer: Self-pay

## 2013-04-11 ENCOUNTER — Encounter: Payer: Self-pay | Admitting: Emergency Medicine

## 2013-04-11 ENCOUNTER — Ambulatory Visit (INDEPENDENT_AMBULATORY_CARE_PROVIDER_SITE_OTHER): Payer: Medicare Other | Admitting: Emergency Medicine

## 2013-04-11 VITALS — BP 110/60 | HR 89 | Ht 60.0 in | Wt 103.2 lb

## 2013-04-11 DIAGNOSIS — J449 Chronic obstructive pulmonary disease, unspecified: Secondary | ICD-10-CM | POA: Diagnosis not present

## 2013-04-11 NOTE — Assessment & Plan Note (Signed)
Her bronchitis is improved. She continues to be quite limited by her chronic lung disease. She does still work and has multiple social stressors on her including difficult relationships with her husband and the rest of her family. I do believe that the stressors are adversely affecting her health. I offered her support and I also offered to refer her to a psychologist or psychiatrist to talk more. She doesn't want to pursue this at this time.  - Continue same regimen - ROV in 3 months

## 2013-04-11 NOTE — Patient Instructions (Signed)
Please continue your current medications and oxygen as you are using them.  Follow with Dr [name] in 3 months or sooner if you have any problems.

## 2013-04-11 NOTE — Progress Notes (Signed)
Subjective:    Patient ID: Cheyenne [name], female    DOB: Jul 14, 1945, 67 y.o.   MRN: [id number]  HPI 67 yo smoker, former EtOH abuse, hx hypothroidism, DJD, anxiety. Was diagnosed clinically with "asthma" as an adult in 2000 in setting bronchospasm and the flu. Has had similar episodes over the last few yrs, required abx and steroids multiple times. Has been on Advair x 2+ yrs. Also has allergies. Complians today of exhaustion, cough prod of some mucous. Has had some mild epistaxis.   Seen by Dr Zachery Dauer 04/29/09 for acute flare in setting URI. Tretated with pred and abx both are finished, now improved but still with the symptoms above. CXR without infiltrate per pt's report.   ROV 06/02/09 -- returns feeling better since flare. She quit smoking a month ago!!  Had PFT's today as below. Taking Advair two times a day, Using proventil less than every day.   ROV 09/01/09 -- returns for COPD. She has NOT gone back to smoking! Last visit we added Spiriva to Advair two times a day. Also started O2 with exertion and sleep.  Exercise tolerance is significantly improved. No flares since last time.   ROV 12/30/09 -- COPD, ? asthmatic component. presents today for f/u. Has had more trouble w PND, congestion, some more cough prod of white thick phlegm, ? more chest tightness for the last 2 weeks. Using proventil   ROV 05/04/10 -- COPD, suspected reactive component, chronic rhinitis.  Since last visit feels basically stable. No URI's, no flares, no hospitalizations. She is seeing Dr Danielle Dess for back/spine problems, may need repeat back SGY. FEV1 37% predicted (0.72L) Currently on Advair, uses SABA rarely, about weekly.   ROV 09/16/10 -- COPD, chronic rhinitis. She is s/p spinal SGY by Dr Danielle Dess for synovial cyst. Breathing well, no flares, no hospitalizations for pulmonary issues. She is on Spiriva + Advair, rare SABA use. Has had some difficulty w allergies.  Remains active. Wearing 2L/min at all times.  No real problems w  cough.   ROV 04/15/11 -- 66 yo woman COPD, suspected reactive component, chronic rhinitis, hypoxemia. Has been doing fairly well, had some increased nasal gtt with increased dust at home after attic cleaning, some increased wheeze, but now improving. SABA use didn't go up.   ROV 11/09/11 -- 67 yo woman COPD, suspected reactive component, chronic rhinitis, hypoxemia. Has had more difficulty with allergies for the last 2 years - seems to have tolerated. Using SABA more frequently during the allergy season. She is on fluticasone. COPD rx w Spiriva + Advair. No exacerbations since last time.   ROV 01/07/12 -- 67 yo woman COPD, suspected reactive component, chronic rhinitis, hypoxemia. Her COPD is harder to control during bad allergy times. Remains on fluticasone. She is on spiriva + advair. Uses albuterol about every other day.   ROV 04/28/12 -- 67 yo woman COPD, suspected reactive component, chronic rhinitis, hypoxemia.  She has been dealing w a lot of emotional stress. She had a recent flare of her allergies that led to more dyspnea. She wasn't treated, improved without pred or abx. She still has cough every morning, clears the most mucous at this time. She remains on Advair, Spiriva. Uses SABA about daily. O2 set on 2L/min.   05/04/12 Acute OV  Complains of prod cough with yellow mucus, increased SOB, wheezing, tightness in chest x5days -  Patient denies any hemoptysis, orthopnea, PND, or leg swelling. She has not been using any over-the-counter medications. She remains on  Spiriva and Advair She denies any recent hospitalizations or emergency room visits Has had increase albuterol use. Over the last several days  ROV 09/13/12 -- f/u visit for COPD, chronic rhinitis and cough, hypoxemia. She had an acute flare 04/2012, none since. She reports that she has had many social stressors since last June. Has had more cough, congestion. Overall stable. She had a CT scan 05/09/12 that showed RLL nodule 7.38mm.   ROV  11/22/12 -- COPD, chronic rhinitis and cough, hypoxemia. Also RLL nodule on CT scan 1/'14. She remains on O2 2L/min and continues to benefit from this. She still needs the O2. RLL pulm nodule stable from 1/'14 on CT chest from yesterday. Needs repeat in 04/2013. She can tell an overall slow decline in her functional capacity. No flares since last time. Lot of cough, productive.   ROV 02/21/13 -- GOLD c-d COPD, chronic rhinitis and cough, hypoxemia. Presents today for acute OV > having more dyspnea, exhaustion, increased cough with mucous. Has had more allergy type sx also. She tells me that her mother died in 2023/01/22.   ROV March 30, 2013 -- GOLD c-d COPD, chronic rhinitis and cough, hypoxemia.  Last time treated for an AE w pred + doxy. She had some initial improvement in breathing, wheezing, cough. Never completely resolved the cough, now with deeper more purulent green. She has had a lot of sick contacts.   ROV 04/11/13 -- GOLD c-d COPD, chronic rhinitis and cough, hypoxemia. I treated her with levafloxacin x 1 week last visit. This was helpful. She is concerned that she is "losing control of her life". She has never been this acutely sick before. She is dealing with the effects her lung disease has had on her relationships, espec her husband.   Review of Systems As per HPI    Objective:   Physical Exam Filed Vitals:   04/11/13 1634  BP: 110/60  Pulse: 89  Height: 5' (1.524 m)  Weight: 103 lb 3.2 oz (46.811 kg)  SpO2: 94%   Gen: Pleasant, thin, in no distress   ENT: No lesions,  mouth clear,  oropharynx clear, no postnasal drip  Neck: No JVD, no TMG, no carotid bruits  Lungs: No use of accessory muscles,few rhonchi  mild exp wheeze on forced exp  Cardiovascular: RRR, heart sounds normal, no murmur or gallops, no peripheral edema  Musculoskeletal: No deformities, no cyanosis or clubbing  Neuro: alert, non focal  Skin: Warm, no lesions or rashes   CT chest 11/21/12 --   Comparison:  05/12/2012  Findings:  There is no pleural effusion identified. Moderate to advanced  changes of centrilobular emphysema identified. Right upper lung  nodule measures 5 mm, image 28/series 3. Previously this measured  the same. Right lower lobe nodule measures 7 mm, image 35/series  3. Also stable from previous exam. In the left lower lobe there  is a stable 5 mm nodule, image 51/series 3.  The heart size is normal. Prominent coronary artery calcifications  are noted involving the RCA, LAD and left circumflex coronary  artery. No pericardial effusion. No enlarged mediastinal or hilar  lymph nodes. No supraclavicular or axillary adenopathy identified.  Review of the visualized osseous structures is significant for  multilevel spondylosis. No aggressive lytic or sclerotic bone  lesions identified.  Limited imaging through the upper abdomen shows calcified  atherosclerotic disease affecting the abdominal aorta. No aneurysm  identified.  IMPRESSION:  1. No acute cardiopulmonary abnormalities.  2. Bilateral pulmonary nodules are stable from 05/12/2012.  In a  patient that is at increased risk for lung cancer been next follow-  up examination should be obtained at 9-12 months.  3. Prominent coronary artery calcifications.   Assessment & Plan:  COPD Her bronchitis is improved. She continues to be quite limited by her chronic lung disease. She does still work and has multiple social stressors on her including difficult relationships with her husband and the rest of her family. I do believe that the stressors are adversely affecting her health. I offered her support and I also offered to refer her to a psychologist or psychiatrist to talk more. She doesn't want to pursue this at this time.  - Continue same regimen - ROV in 3 months

## 2013-05-15 ENCOUNTER — Telehealth: Payer: Self-pay | Admitting: Emergency Medicine

## 2013-05-15 MED ORDER — ALBUTEROL SULFATE HFA 108 (90 BASE) MCG/ACT IN AERS: 2.0000 | INHALATION_SPRAY | Freq: Four times a day (QID) | RESPIRATORY_TRACT | Status: DC | PRN

## 2013-05-15 NOTE — Telephone Encounter (Signed)
I called and spoke with pt. She reports her insurance will no longer cover proventil. They are wanting her to change to proair. I advised pt they both are the same medication but made by diff manufactures. She was fine with this and RX sent to the pharm. Nothing further needed

## 2013-07-31 ENCOUNTER — Other Ambulatory Visit: Payer: Self-pay | Admitting: Emergency Medicine

## 2013-08-09 DIAGNOSIS — M545 Low back pain, unspecified: Secondary | ICD-10-CM | POA: Diagnosis not present

## 2013-10-17 DIAGNOSIS — M519 Unspecified thoracic, thoracolumbar and lumbosacral intervertebral disc disorder: Secondary | ICD-10-CM | POA: Diagnosis not present

## 2013-10-17 DIAGNOSIS — J449 Chronic obstructive pulmonary disease, unspecified: Secondary | ICD-10-CM | POA: Diagnosis not present

## 2013-10-17 DIAGNOSIS — E039 Hypothyroidism, unspecified: Secondary | ICD-10-CM | POA: Diagnosis not present

## 2013-10-17 DIAGNOSIS — D510 Vitamin B12 deficiency anemia due to intrinsic factor deficiency: Secondary | ICD-10-CM | POA: Diagnosis not present

## 2013-10-17 DIAGNOSIS — Z79899 Other long term (current) drug therapy: Secondary | ICD-10-CM | POA: Diagnosis not present

## 2013-10-17 DIAGNOSIS — E559 Vitamin D deficiency, unspecified: Secondary | ICD-10-CM | POA: Diagnosis not present

## 2013-10-17 DIAGNOSIS — F329 Major depressive disorder, single episode, unspecified: Secondary | ICD-10-CM | POA: Diagnosis not present

## 2013-10-24 DIAGNOSIS — E871 Hypo-osmolality and hyponatremia: Secondary | ICD-10-CM | POA: Diagnosis not present

## 2013-11-27 ENCOUNTER — Other Ambulatory Visit: Payer: Self-pay | Admitting: Emergency Medicine

## 2013-12-04 ENCOUNTER — Telehealth: Payer: Self-pay | Admitting: Emergency Medicine

## 2013-12-04 MED ORDER — FLUTICASONE PROPIONATE 50 MCG/ACT NA SUSP: 1.0000 | Freq: Every day | NASAL | Status: DC

## 2013-12-04 MED ORDER — ALBUTEROL SULFATE HFA 108 (90 BASE) MCG/ACT IN AERS: 2.0000 | INHALATION_SPRAY | Freq: Four times a day (QID) | RESPIRATORY_TRACT | Status: DC | PRN

## 2013-12-04 NOTE — Telephone Encounter (Signed)
Pt aware we do not have samples of either medication.  RX sent to the pharm. Nothing further needed

## 2013-12-13 DIAGNOSIS — E039 Hypothyroidism, unspecified: Secondary | ICD-10-CM | POA: Diagnosis not present

## 2013-12-13 DIAGNOSIS — D510 Vitamin B12 deficiency anemia due to intrinsic factor deficiency: Secondary | ICD-10-CM | POA: Diagnosis not present

## 2013-12-13 DIAGNOSIS — Z23 Encounter for immunization: Secondary | ICD-10-CM | POA: Diagnosis not present

## 2013-12-13 DIAGNOSIS — F411 Generalized anxiety disorder: Secondary | ICD-10-CM | POA: Diagnosis not present

## 2013-12-13 DIAGNOSIS — E559 Vitamin D deficiency, unspecified: Secondary | ICD-10-CM | POA: Diagnosis not present

## 2013-12-13 DIAGNOSIS — F329 Major depressive disorder, single episode, unspecified: Secondary | ICD-10-CM | POA: Diagnosis not present

## 2013-12-13 DIAGNOSIS — J449 Chronic obstructive pulmonary disease, unspecified: Secondary | ICD-10-CM | POA: Diagnosis not present

## 2013-12-24 ENCOUNTER — Ambulatory Visit (INDEPENDENT_AMBULATORY_CARE_PROVIDER_SITE_OTHER): Payer: Medicare Other | Admitting: Emergency Medicine

## 2013-12-24 ENCOUNTER — Encounter: Payer: Self-pay | Admitting: Emergency Medicine

## 2013-12-24 ENCOUNTER — Encounter (INDEPENDENT_AMBULATORY_CARE_PROVIDER_SITE_OTHER): Payer: Self-pay

## 2013-12-24 VITALS — BP 118/62 | HR 86 | Ht 60.25 in | Wt 102.0 lb

## 2013-12-24 DIAGNOSIS — J449 Chronic obstructive pulmonary disease, unspecified: Secondary | ICD-10-CM

## 2013-12-24 MED ORDER — FLUTICASONE FUROATE-VILANTEROL 100-25 MCG/INH IN AEPB: 1.0000 | INHALATION_SPRAY | Freq: Every day | RESPIRATORY_TRACT | Status: DC

## 2013-12-24 NOTE — Progress Notes (Signed)
Subjective:    Patient ID: Cheyenne [name], female    DOB: Jul 14, 1945, 68 y.o.   MRN: [id number]  HPI 68 yo smoker, former EtOH abuse, hx hypothroidism, DJD, anxiety. Was diagnosed clinically with "asthma" as an adult in 2000 in setting bronchospasm and the flu. Has had similar episodes over the last few yrs, required abx and steroids multiple times. Has been on Advair x 2+ yrs. Also has allergies. Complians today of exhaustion, cough prod of some mucous. Has had some mild epistaxis.   Seen by Dr Zachery Dauer 04/29/09 for acute flare in setting URI. Tretated with pred and abx both are finished, now improved but still with the symptoms above. CXR without infiltrate per pt's report.   ROV 06/02/09 -- returns feeling better since flare. She quit smoking a month ago!!  Had PFT's today as below. Taking Advair two times a day, Using proventil less than every day.   ROV 09/01/09 -- returns for COPD. She has NOT gone back to smoking! Last visit we added Spiriva to Advair two times a day. Also started O2 with exertion and sleep.  Exercise tolerance is significantly improved. No flares since last time.   ROV 12/30/09 -- COPD, ? asthmatic component. presents today for f/u. Has had more trouble w PND, congestion, some more cough prod of white thick phlegm, ? more chest tightness for the last 2 weeks. Using proventil   ROV 05/04/10 -- COPD, suspected reactive component, chronic rhinitis.  Since last visit feels basically stable. No URI's, no flares, no hospitalizations. She is seeing Dr Danielle Dess for back/spine problems, may need repeat back SGY. FEV1 37% predicted (0.72L) Currently on Advair, uses SABA rarely, about weekly.   ROV 09/16/10 -- COPD, chronic rhinitis. She is s/p spinal SGY by Dr Danielle Dess for synovial cyst. Breathing well, no flares, no hospitalizations for pulmonary issues. She is on Spiriva + Advair, rare SABA use. Has had some difficulty w allergies.  Remains active. Wearing 2L/min at all times.  No real problems w  cough.   ROV 04/15/11 -- 68 yo woman COPD, suspected reactive component, chronic rhinitis, hypoxemia. Has been doing fairly well, had some increased nasal gtt with increased dust at home after attic cleaning, some increased wheeze, but now improving. SABA use didn't go up.   ROV 11/09/11 -- 68 yo woman COPD, suspected reactive component, chronic rhinitis, hypoxemia. Has had more difficulty with allergies for the last 2 years - seems to have tolerated. Using SABA more frequently during the allergy season. She is on fluticasone. COPD rx w Spiriva + Advair. No exacerbations since last time.   ROV 01/07/12 -- 68 yo woman COPD, suspected reactive component, chronic rhinitis, hypoxemia. Her COPD is harder to control during bad allergy times. Remains on fluticasone. She is on spiriva + advair. Uses albuterol about every other day.   ROV 04/28/12 -- 68 yo woman COPD, suspected reactive component, chronic rhinitis, hypoxemia.  She has been dealing w a lot of emotional stress. She had a recent flare of her allergies that led to more dyspnea. She wasn't treated, improved without pred or abx. She still has cough every morning, clears the most mucous at this time. She remains on Advair, Spiriva. Uses SABA about daily. O2 set on 2L/min.   05/04/12 Acute OV  Complains of prod cough with yellow mucus, increased SOB, wheezing, tightness in chest x5days -  Patient denies any hemoptysis, orthopnea, PND, or leg swelling. She has not been using any over-the-counter medications. She remains on  Spiriva and Advair She denies any recent hospitalizations or emergency room visits Has had increase albuterol use. Over the last several days  ROV 09/13/12 -- f/u visit for COPD, chronic rhinitis and cough, hypoxemia. She had an acute flare 04/2012, none since. She reports that she has had many social stressors since last June. Has had more cough, congestion. Overall stable. She had a CT scan 05/09/12 that showed RLL nodule 7.62mm.   ROV  11/22/12 -- COPD, chronic rhinitis and cough, hypoxemia. Also RLL nodule on CT scan 1/'14. She remains on O2 2L/min and continues to benefit from this. She still needs the O2. RLL pulm nodule stable from 1/'14 on CT chest from yesterday. Needs repeat in 04/2013. She can tell an overall slow decline in her functional capacity. No flares since last time. Lot of cough, productive.   ROV 02/21/13 -- GOLD c-d COPD, chronic rhinitis and cough, hypoxemia. Presents today for acute OV > having more dyspnea, exhaustion, increased cough with mucous. Has had more allergy type sx also. She tells me that her mother died in 02-23-2023.   ROV 04-11-13 -- GOLD c-d COPD, chronic rhinitis and cough, hypoxemia.  Last time treated for an AE w pred + doxy. She had some initial improvement in breathing, wheezing, cough. Never completely resolved the cough, now with deeper more purulent green. She has had a lot of sick contacts.   ROV 04/11/13 -- GOLD c-d COPD, chronic rhinitis and cough, hypoxemia. I treated her with levafloxacin x 1 week last visit. This was helpful. She is concerned that she is "losing control of her life". She has never been this acutely sick before. She is dealing with the effects her lung disease has had on her relationships, espec her husband.   ROV 12/24/13 -- follow up visit for GOLD c-d COPD, chronic rhinitis and cough, hypoxemia. She is planning to retire. She has been more SOB slowly and progressively. She is affected by stress, weather, exposures. No flares since last time.  She has wheeze, exertional SOB. Wakes up at night with nasal congestion and occlusion.    Review of Systems As per HPI    Objective:   Physical Exam Filed Vitals:   12/24/13 1601  BP: 118/62  Pulse: 86  Height: 5' 0.25" (1.53 m)  Weight: 102 lb (46.267 kg)  SpO2: 100%   Gen: Pleasant, thin, in no distress   ENT: No lesions,  mouth clear,  oropharynx clear, no postnasal drip  Neck: No JVD, no TMG, no carotid  bruits  Lungs: No use of accessory muscles,few rhonchi  mild exp wheeze on forced exp  Cardiovascular: RRR, heart sounds normal, no murmur or gallops, no peripheral edema  Musculoskeletal: No deformities, no cyanosis or clubbing  Neuro: alert, non focal  Skin: Warm, no lesions or rashes   CT chest 11/21/12 --   Comparison: 05/12/2012  Findings:  There is no pleural effusion identified. Moderate to advanced  changes of centrilobular emphysema identified. Right upper lung  nodule measures 5 mm, image 28/series 3. Previously this measured  the same. Right lower lobe nodule measures 7 mm, image 35/series  3. Also stable from previous exam. In the left lower lobe there  is a stable 5 mm nodule, image 51/series 3.  The heart size is normal. Prominent coronary artery calcifications  are noted involving the RCA, LAD and left circumflex coronary  artery. No pericardial effusion. No enlarged mediastinal or hilar  lymph nodes. No supraclavicular or axillary adenopathy identified.  Review  of the visualized osseous structures is significant for  multilevel spondylosis. No aggressive lytic or sclerotic bone  lesions identified.  Limited imaging through the upper abdomen shows calcified  atherosclerotic disease affecting the abdominal aorta. No aneurysm  identified.  IMPRESSION:  1. No acute cardiopulmonary abnormalities.  2. Bilateral pulmonary nodules are stable from 05/12/2012. In a  patient that is at increased risk for lung cancer been next follow-  up examination should be obtained at 9-12 months.  3. Prominent coronary artery calcifications.   Assessment & Plan:  COPD Clearly severe and debilitating - this plus her overall work and living situation clearly contributing to depression. I suspect she will need to start daily pred at some point in the near future. For now, would like to do a trial of changing Advair to [facility]. rov 1

## 2013-12-24 NOTE — Assessment & Plan Note (Signed)
Clearly severe and debilitating - this plus her overall work and living situation clearly contributing to depression. I suspect she will need to start daily pred at some point in the near future. For now, would like to do a trial of changing Advair to [facility]. rov 1

## 2013-12-24 NOTE — Patient Instructions (Signed)
We will temporarily stop Advair and start Breo once a day to see if you benefit.  Continue your other medications as you have been taking them  Follow with Dr [name] in 1 month

## 2014-01-23 ENCOUNTER — Encounter: Payer: Self-pay | Admitting: Emergency Medicine

## 2014-01-23 ENCOUNTER — Ambulatory Visit (INDEPENDENT_AMBULATORY_CARE_PROVIDER_SITE_OTHER): Payer: Medicare Other | Admitting: Emergency Medicine

## 2014-01-23 VITALS — BP 118/82 | HR 81 | Temp 97.5°F | Ht 65.0 in | Wt 100.8 lb

## 2014-01-23 DIAGNOSIS — J309 Allergic rhinitis, unspecified: Secondary | ICD-10-CM

## 2014-01-23 DIAGNOSIS — J449 Chronic obstructive pulmonary disease, unspecified: Secondary | ICD-10-CM

## 2014-01-23 DIAGNOSIS — Z23 Encounter for immunization: Secondary | ICD-10-CM | POA: Diagnosis not present

## 2014-01-23 MED ORDER — CHLORPHENIRAMINE MALEATE 4 MG PO TABS: 4.0000 mg | ORAL_TABLET | Freq: Two times a day (BID) | ORAL | Status: DC | PRN

## 2014-01-23 NOTE — Progress Notes (Signed)
Subjective:    Patient ID: Cheyenne [name], female    DOB: 07-05-1945, 68 y.o.   MRN: [id number]  HPI 68 yo smoker, former EtOH abuse, hx hypothroidism, DJD, anxiety. Was diagnosed clinically with "asthma" as an adult in 2000 in setting bronchospasm and the flu. Has had similar episodes over the last few yrs, required abx and steroids multiple times. Has been on Advair x 2+ yrs. Also has allergies. Complians today of exhaustion, cough prod of some mucous. Has had some mild epistaxis.   Seen by Dr Zachery DauerBarnes 04/29/09 for acute flare in setting URI. Tretated with pred and abx both are finished, now improved but still with the symptoms above. CXR without infiltrate per pt's report.   ROV 04/11/13 -- GOLD c-d COPD, chronic rhinitis and cough, hypoxemia. I treated her with levafloxacin x 1 week last visit. This was helpful. She is concerned that she is "losing control of her life". She has never been this acutely sick before. She is dealing with the effects her lung disease has had on her relationships, espec her husband.   ROV 12/24/13 -- follow up visit for GOLD c-d COPD, chronic rhinitis and cough, hypoxemia. She is planning to retire. She has been more SOB slowly and progressively. She is affected by stress, weather, exposures. No flares since last time.  She has wheeze, exertional SOB. Wakes up at night with nasal congestion and occlusion.    ROV 01/23/14 -- hx of severe COPD, GOLD c-d, cough due to rhinitis, hypoxemia. She returns after 1 month - we started breo last time for Advair. She likes the delivery system, although it was a little tough to say whether it worked better - she had her Prevnar since last time, she has had more rhinitis. She is on fluticasone nasal spray, one spray daily.     Review of Systems As per HPI    Objective:   Physical Exam Filed Vitals:   01/23/14 1611  BP: 118/82  Pulse: 81  Temp: 97.5 F (36.4 C)  TempSrc: Oral  Height: 5\' 5"  (1.651 m)  Weight: 100 lb 12.8 oz  (45.723 kg)  SpO2: 96%   Gen: Pleasant, thin, in no distress   ENT: No lesions,  mouth clear,  oropharynx clear, no postnasal drip  Neck: No JVD, no TMG, no carotid bruits  Lungs: No use of accessory muscles, no wheezing  Cardiovascular: RRR, heart sounds normal, no murmur or gallops, no peripheral edema  Musculoskeletal: No deformities, no cyanosis or clubbing  Neuro: alert, non focal  Skin: Warm, no lesions or rashes   CT chest 11/21/12 --   Comparison: 05/12/2012  Findings:  There is no pleural effusion identified. Moderate to advanced  changes of centrilobular emphysema identified. Right upper lung  nodule measures 5 mm, image 28/series 3. Previously this measured  the same. Right lower lobe nodule measures 7 mm, image 35/series  3. Also stable from previous exam. In the left lower lobe there  is a stable 5 mm nodule, image 51/series 3.  The heart size is normal. Prominent coronary artery calcifications  are noted involving the RCA, LAD and left circumflex coronary  artery. No pericardial effusion. No enlarged mediastinal or hilar  lymph nodes. No supraclavicular or axillary adenopathy identified.  Review of the visualized osseous structures is significant for  multilevel spondylosis. No aggressive lytic or sclerotic bone  lesions identified.  Limited imaging through the upper abdomen shows calcified  atherosclerotic disease affecting the abdominal aorta. No aneurysm  identified.  IMPRESSION:  1. No acute cardiopulmonary abnormalities.  2. Bilateral pulmonary nodules are stable from 05/12/2012. In a  patient that is at increased risk for lung cancer been next follow-  up examination should be obtained at 9-12 months.  3. Prominent coronary artery calcifications.   Assessment & Plan:  COPD She seems to have tolerated the breo, would like to continue this + spiriva, albuterol prn.   Allergic rhinitis - trial of loratadine  - increase fluticasone to bid - start  chlorpheniramine prn

## 2014-01-23 NOTE — Patient Instructions (Signed)
Please continue Breo once a day and Spiriva once a day Use albuterol as needed Wear your oxygen at all times.  Increase your fluticasone nasal spray to 1 spray each nostril twice a day Try using chlorpheniramine (OTC) as directed for nasal congestion Flu shot today Follow with Dr [name] in 2 months or sooner if you have any problems.

## 2014-01-23 NOTE — Assessment & Plan Note (Signed)
-   trial of loratadine  - increase fluticasone to bid - start chlorpheniramine prn

## 2014-01-23 NOTE — Assessment & Plan Note (Signed)
She seems to have tolerated the breo, would like to continue this + spiriva, albuterol prn.

## 2014-01-28 ENCOUNTER — Other Ambulatory Visit: Payer: Self-pay | Admitting: Emergency Medicine

## 2014-02-07 DIAGNOSIS — M5416 Radiculopathy, lumbar region: Secondary | ICD-10-CM | POA: Diagnosis not present

## 2014-02-28 ENCOUNTER — Other Ambulatory Visit: Payer: Self-pay | Admitting: Emergency Medicine

## 2014-03-26 ENCOUNTER — Encounter: Payer: Self-pay | Admitting: Emergency Medicine

## 2014-03-26 ENCOUNTER — Ambulatory Visit (INDEPENDENT_AMBULATORY_CARE_PROVIDER_SITE_OTHER): Payer: Medicare Other | Admitting: Emergency Medicine

## 2014-03-26 VITALS — BP 108/70 | HR 82 | Ht 60.5 in | Wt 104.0 lb

## 2014-03-26 DIAGNOSIS — J449 Chronic obstructive pulmonary disease, unspecified: Secondary | ICD-10-CM | POA: Diagnosis not present

## 2014-03-26 NOTE — Assessment & Plan Note (Signed)
Severe disease but appears to be clinically stable. No evidence of an exacerbation and has not had a flare since her last visit. We'll continue her same medications and plan to follow-up in 4 months

## 2014-03-26 NOTE — Progress Notes (Signed)
Subjective:    Patient ID: Cheyenne [name], female    DOB: 1945/05/11, 68 y.o.   MRN: [id number]  HPI 68 yo smoker, former EtOH abuse, hx hypothroidism, DJD, anxiety. Was diagnosed clinically with "asthma" as an adult in 2000 in setting bronchospasm and the flu. Has had similar episodes over the last few yrs, required abx and steroids multiple times. Has been on Advair x 2+ yrs. Also has allergies. Complians today of exhaustion, cough prod of some mucous. Has had some mild epistaxis.   Seen by Dr Zachery DauerBarnes 04/29/09 for acute flare in setting URI. Tretated with pred and abx both are finished, now improved but still with the symptoms above. CXR without infiltrate per pt's report.   ROV 04/11/13 -- GOLD c-d COPD, chronic rhinitis and cough, hypoxemia. I treated her with levafloxacin x 1 week last visit. This was helpful. She is concerned that she is "losing control of her life". She has never been this acutely sick before. She is dealing with the effects her lung disease has had on her relationships, espec her husband.   ROV 12/24/13 -- follow up visit for GOLD c-d COPD, chronic rhinitis and cough, hypoxemia. She is planning to retire. She has been more SOB slowly and progressively. She is affected by stress, weather, exposures. No flares since last time.  She has wheeze, exertional SOB. Wakes up at night with nasal congestion and occlusion.    ROV 01/23/14 -- hx of severe COPD, GOLD c-d, cough due to rhinitis, hypoxemia. She returns after 1 month - we started breo last time for Advair. She likes the delivery system, although it was a little tough to say whether it worked better - she had her Prevnar since last time, she has had more rhinitis. She is on fluticasone nasal spray, one spray daily.   ROV 03/26/14 -- severe COPD, GOLD c-d, cough due to rhinitis, hypoxemia.  She is having some cough, drainage. She likes the [place], remains on Spiriva.  She uses nasal saline, nasal steroid, chlorpheniramine. No flares,  has overall done very well given the severity of her disease.    Review of Systems As per HPI    Objective:   Physical Exam Filed Vitals:   03/26/14 1644  BP: 108/70  Pulse: 82  Height: 5' 0.5" (1.537 m)  Weight: 104 lb (47.174 kg)  SpO2: 95%   Gen: Pleasant, thin, in no distress   ENT: No lesions,  mouth clear,  oropharynx clear, no postnasal drip  Neck: No JVD, no TMG, no carotid bruits  Lungs: No use of accessory muscles, no wheezing  Cardiovascular: RRR, heart sounds normal, no murmur or gallops, no peripheral edema  Musculoskeletal: No deformities, no cyanosis or clubbing  Neuro: alert, non focal  Skin: Warm, no lesions or rashes   CT chest 11/21/12 --   Comparison: 05/12/2012  Findings:  There is no pleural effusion identified. Moderate to advanced  changes of centrilobular emphysema identified. Right upper lung  nodule measures 5 mm, image 28/series 3. Previously this measured  the same. Right lower lobe nodule measures 7 mm, image 35/series  3. Also stable from previous exam. In the left lower lobe there  is a stable 5 mm nodule, image 51/series 3.  The heart size is normal. Prominent coronary artery calcifications  are noted involving the RCA, LAD and left circumflex coronary  artery. No pericardial effusion. No enlarged mediastinal or hilar  lymph nodes. No supraclavicular or axillary adenopathy identified.  Review of the visualized  osseous structures is significant for  multilevel spondylosis. No aggressive lytic or sclerotic bone  lesions identified.  Limited imaging through the upper abdomen shows calcified  atherosclerotic disease affecting the abdominal aorta. No aneurysm  identified.  IMPRESSION:  1. No acute cardiopulmonary abnormalities.  2. Bilateral pulmonary nodules are stable from 05/12/2012. In a  patient that is at increased risk for lung cancer been next follow-  up examination should be obtained at 9-12 months.  3. Prominent coronary  artery calcifications.   Assessment & Plan:  COPD (chronic obstructive pulmonary disease) Severe disease but appears to be clinically stable. No evidence of an exacerbation and has not had a flare since her last visit. We'll continue her same medications and plan to follow-up in 4 months

## 2014-03-26 NOTE — Patient Instructions (Signed)
Please continue your medications you have been taking them Wear your oxygen at all times Follow with Dr [name] in 4 months or sooner if you have any problems.

## 2014-03-28 ENCOUNTER — Other Ambulatory Visit: Payer: Self-pay | Admitting: Emergency Medicine

## 2014-04-27 ENCOUNTER — Other Ambulatory Visit: Payer: Self-pay | Admitting: Emergency Medicine

## 2014-06-25 ENCOUNTER — Other Ambulatory Visit: Payer: Self-pay | Admitting: Emergency Medicine

## 2014-07-29 ENCOUNTER — Other Ambulatory Visit: Payer: Self-pay | Admitting: Emergency Medicine

## 2014-07-30 ENCOUNTER — Ambulatory Visit (INDEPENDENT_AMBULATORY_CARE_PROVIDER_SITE_OTHER): Payer: Medicare Other | Admitting: Emergency Medicine

## 2014-07-30 ENCOUNTER — Other Ambulatory Visit: Payer: Self-pay | Admitting: Emergency Medicine

## 2014-07-30 ENCOUNTER — Encounter: Payer: Self-pay | Admitting: Emergency Medicine

## 2014-07-30 VITALS — BP 120/80 | HR 80 | Ht 61.0 in | Wt 101.0 lb

## 2014-07-30 DIAGNOSIS — J309 Allergic rhinitis, unspecified: Secondary | ICD-10-CM

## 2014-07-30 DIAGNOSIS — J449 Chronic obstructive pulmonary disease, unspecified: Secondary | ICD-10-CM | POA: Diagnosis not present

## 2014-07-30 NOTE — Assessment & Plan Note (Signed)
Reviewed her meds. She will increase her fluticasone to twice a day during the spring months.

## 2014-07-30 NOTE — Patient Instructions (Signed)
Please continue your current medications as you are taking them.  You may increase your fluticasone to 2 sprays each nostril twice a day  Wear your oxygen at all times.  Follow with Dr [name] in 3 months or sooner if you have any problems.

## 2014-07-30 NOTE — Assessment & Plan Note (Signed)
She has remained stable and amazingly functional despite very severe disease. We discussed retirement, how that might impact her daily exertional tolerance. She will continue her same meds and o2

## 2014-07-30 NOTE — Progress Notes (Signed)
Subjective:    Patient ID: Cheyenne [name], female    DOB: 1945/11/16, 69 y.o.   MRN: [id number]  HPI 69 yo smoker, former EtOH abuse, hx hypothroidism, DJD, anxiety. Was diagnosed clinically with "asthma" as an adult in 2000 in setting bronchospasm and the flu. Has had similar episodes over the last few yrs, required abx and steroids multiple times. Has been on Advair x 2+ yrs. Also has allergies. Complians today of exhaustion, cough prod of some mucous. Has had some mild epistaxis.   Seen by Dr Zachery DauerBarnes 04/29/09 for acute flare in setting URI. Tretated with pred and abx both are finished, now improved but still with the symptoms above. CXR without infiltrate per pt's report.   ROV 04/11/13 -- GOLD c-d COPD, chronic rhinitis and cough, hypoxemia. I treated her with levafloxacin x 1 week last visit. This was helpful. She is concerned that she is "losing control of her life". She has never been this acutely sick before. She is dealing with the effects her lung disease has had on her relationships, espec her husband.   ROV 12/24/13 -- follow up visit for GOLD c-d COPD, chronic rhinitis and cough, hypoxemia. She is planning to retire. She has been more SOB slowly and progressively. She is affected by stress, weather, exposures. No flares since last time.  She has wheeze, exertional SOB. Wakes up at night with nasal congestion and occlusion.    ROV 01/23/14 -- hx of severe COPD, GOLD c-d, cough due to rhinitis, hypoxemia. She returns after 1 month - we started breo last time for Advair. She likes the delivery system, although it was a little tough to say whether it worked better - she had her Prevnar since last time, she has had more rhinitis. She is on fluticasone nasal spray, one spray daily.   ROV 03/26/14 -- severe COPD, GOLD c-d, cough due to rhinitis, hypoxemia.  She is having some cough, drainage. She likes the [place]o, remains on Spiriva.  She uses nasal saline, nasal steroid, chlorpheniramine. No flares,  has overall done very well given the severity of her disease.   ROV 07/30/14 -- follow up visit for GOLD d COPD, cough, allergies. Over the last few weeks has had more exertional SOB, chest tightness. She is having some increased mucous. She is on chlorpheniramine, fluticasone qd. Uses spiriva and breo. No Flares. Still working 3 days a week, planning to retire sometime soon.    Review of Systems As per HPI    Objective:   Physical Exam Filed Vitals:   07/30/14 1643  BP: 120/80  Pulse: 80  Height: 5\' 1"  (1.549 m)  Weight: 101 lb (45.813 kg)  SpO2: 91%   Gen: Pleasant, thin, in no distress   ENT: No lesions,  mouth clear,  oropharynx clear, no postnasal drip  Neck: No JVD, no TMG, no carotid bruits  Lungs: No use of accessory muscles, no wheezing  Cardiovascular: RRR, heart sounds normal, no murmur or gallops, no peripheral edema  Musculoskeletal: No deformities, no cyanosis or clubbing  Neuro: alert, non focal  Skin: Warm, no lesions or rashes   CT chest 11/21/12 --   Comparison: 05/12/2012  Findings:  There is no pleural effusion identified. Moderate to advanced  changes of centrilobular emphysema identified. Right upper lung  nodule measures 5 mm, image 28/series 3. Previously this measured  the same. Right lower lobe nodule measures 7 mm, image 35/series  3. Also stable from previous exam. In the left lower lobe there  is a stable 5 mm nodule, image 51/series 3.  The heart size is normal. Prominent coronary artery calcifications  are noted involving the RCA, LAD and left circumflex coronary  artery. No pericardial effusion. No enlarged mediastinal or hilar  lymph nodes. No supraclavicular or axillary adenopathy identified.  Review of the visualized osseous structures is significant for  multilevel spondylosis. No aggressive lytic or sclerotic bone  lesions identified.  Limited imaging through the upper abdomen shows calcified  atherosclerotic disease affecting the  abdominal aorta. No aneurysm  identified.  IMPRESSION:  1. No acute cardiopulmonary abnormalities.  2. Bilateral pulmonary nodules are stable from 05/12/2012. In a  patient that is at increased risk for lung cancer been next follow-  up examination should be obtained at 9-12 months.  3. Prominent coronary artery calcifications.   Assessment & Plan:  Allergic rhinitis Reviewed her meds. She will increase her fluticasone to twice a day during the spring months.    COPD (chronic obstructive pulmonary disease) She has remained stable and amazingly functional despite very severe disease. We discussed retirement, how that might impact her daily exertional tolerance. She will continue her same meds and o2

## 2014-08-05 ENCOUNTER — Encounter: Payer: Self-pay | Admitting: Internal Medicine

## 2014-08-05 ENCOUNTER — Ambulatory Visit (INDEPENDENT_AMBULATORY_CARE_PROVIDER_SITE_OTHER): Payer: Medicare Other | Admitting: Internal Medicine

## 2014-08-05 ENCOUNTER — Ambulatory Visit (INDEPENDENT_AMBULATORY_CARE_PROVIDER_SITE_OTHER)
Admission: RE | Admit: 2014-08-05 | Discharge: 2014-08-05 | Disposition: A | Discharge status: Home / Self Care | Payer: Medicare Other | Source: Ambulatory Visit | Attending: Internal Medicine | Admitting: Internal Medicine

## 2014-08-05 VITALS — BP 110/64 | HR 100 | Ht 61.0 in | Wt 99.0 lb

## 2014-08-05 DIAGNOSIS — R509 Fever, unspecified: Secondary | ICD-10-CM | POA: Diagnosis not present

## 2014-08-05 DIAGNOSIS — J9612 Chronic respiratory failure with hypercapnia: Secondary | ICD-10-CM

## 2014-08-05 DIAGNOSIS — J432 Centrilobular emphysema: Secondary | ICD-10-CM

## 2014-08-05 DIAGNOSIS — J449 Chronic obstructive pulmonary disease, unspecified: Secondary | ICD-10-CM | POA: Diagnosis not present

## 2014-08-05 DIAGNOSIS — R0602 Shortness of breath: Secondary | ICD-10-CM | POA: Diagnosis not present

## 2014-08-05 DIAGNOSIS — R05 Cough: Secondary | ICD-10-CM | POA: Diagnosis not present

## 2014-08-05 DIAGNOSIS — J9610 Chronic respiratory failure, unspecified whether with hypoxia or hypercapnia: Secondary | ICD-10-CM | POA: Insufficient documentation

## 2014-08-05 MED ORDER — PREDNISONE 10 MG PO TABS: ORAL_TABLET | ORAL | Status: DC

## 2014-08-05 MED ORDER — ALBUTEROL SULFATE HFA 108 (90 BASE) MCG/ACT IN AERS: INHALATION_SPRAY | RESPIRATORY_TRACT | Status: DC

## 2014-08-05 MED ORDER — DOXYCYCLINE HYCLATE 100 MG PO TABS: 100.0000 mg | ORAL_TABLET | Freq: Two times a day (BID) | ORAL | Status: DC

## 2014-08-05 NOTE — Patient Instructions (Signed)
Prednisone 10 mg take  4 each am x 2 days,   2 each am x 2 days,  1 each am x 2 days and stop   Doxycline 100 .mg twice daily before meals for glass of water  For cough mucinex dm 1200 mg every 12 hours and use the flutter valve   For breathing > proair up to 2 puffs every 4 hours as needed   Please remember to go to the xray department downstairs for your tests - we will call you with the results when they are available.  Please schedule a follow up office visit in 2 weeks, sooner if needed to see Dr [name]

## 2014-08-05 NOTE — Assessment & Plan Note (Signed)
pfts 2/011   FEV1 0.79 (40%) ratio 33  The proper method of use, as well as anticipated side effects, of a metered-dose inhaler are discussed and demonstrated to the patient. Improved effectiveness after extensive coaching during this visit to a level of approximately  50% from a baseline of < 25%   Acute exac with poor hfa technique/ limited Ti   rec Prednisone 10 mg take  4 each am x 2 days,   2 each am x 2 days,  1 each am x 2 days and stop Doxy  x 10 days  Add flutter valve

## 2014-08-05 NOTE — Progress Notes (Signed)
Subjective:    Patient ID: Cheyenne [name], female    DOB: 1945/07/31, 69 y.o.   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 Was diagnosed clinically with "asthma" as an adult in 2000 in setting bronchospasm and the flu. Has had similar episodes over the last few yrs, required abx and steroids multiple times. Has been on Advair x 2+ yrs. Also has allergies. Complians today of exhaustion, cough prod of some mucous. Has had some mild epistaxis.   Seen by Dr Cheyenne [name] 04/29/09 for acute flare in setting URI. Tretated with pred and abx both are finished, now improved but still with the symptoms above. CXR without infiltrate per pt's report.   ROV 04/11/13 -- GOLD c-d COPD, chronic rhinitis and cough, hypoxemia. I treated her with levafloxacin x 1 week last visit. This was helpful. She is concerned that she is "losing control of her life". She has never been this acutely sick before. She is dealing with the effects her lung disease has had on her relationships, espec her husband.   ROV 12/24/13 -- follow up visit for GOLD c-d COPD, chronic rhinitis and cough, hypoxemia. She is planning to retire. She has been more SOB slowly and progressively. She is affected by stress, weather, exposures. No flares since last time.  She has wheeze, exertional SOB. Wakes up at night with nasal congestion and occlusion.    ROV 01/23/14 -- hx of severe COPD, GOLD c-d, cough due to rhinitis, hypoxemia. She returns after 1 month - we started breo last time for Advair. She likes the delivery system, although it was a little tough to say whether it worked better - she had her Prevnar since last time, she has had more rhinitis. She is on fluticasone nasal spray, one spray daily.   ROV 03/26/14 -- severe COPD, GOLD c-d, cough due to rhinitis, hypoxemia.  She is having some cough, drainage. She likes the [place], remains on Spiriva.  She uses nasal saline, nasal steroid,  chlorpheniramine. No flares, has overall done very well given the severity of her disease.   ROV 07/30/14 -- follow up visit for GOLD d COPD, cough, allergies. Over the last few weeks has had more exertional SOB, chest tightness. She is having some increased mucous. She is on chlorpheniramine, fluticasone qd. Uses spiriva and breo. No Flares. Still working 3 days a week, planning to retire sometime soon.  rec Please continue your current medications as you are taking them.  You may increase your fluticasone to 2 sprays each nostril twice a day  Wear your oxygen at all times.   08/05/2014  Acute  ov/Cheyenne [name] re: cough/ sob GOLD D copd  Chief Complaint  Patient presents with  . Acute Visit    RB pt; trouble catching breath x1week; chest tightness; CP; headaches;no appetite;   worse x sev months, esp last 2 weeks assoc with productive cough mostly darker mucus but nothing really green or yellow  Using proair on avg before got worse, now using every 6 hours  And does help some last used it 2 h. prior to OV    No obvious day to day or daytime variabilty or assoc subjective wheeze overt sinus or hb symptoms. No unusual exp hx or h/o childhood pna/ asthma or knowledge of premature birth.  Sleeping ok without nocturnal  or early am exacerbation  of respiratory  c/o's or need for noct saba. Also denies any  obvious fluctuation of symptoms with weather or environmental changes or other aggravating or alleviating factors except as outlined above   Current Medications, Allergies, Complete Past Medical History, Past Surgical History, Family History, and Social History were reviewed in [facility] electronic medical record.  ROS  The following are not active complaints unless bolded sore throat, dysphagia, dental problems, itching, sneezing,  nasal congestion or excess/ purulent secretions, ear ache,   fever, chills, sweats, unintended wt loss, pleuritic or exertional cp, hemoptysis,  orthopnea pnd or leg  swelling, presyncope, palpitations, heartburn, abdominal pain, anorexia, nausea, vomiting, diarrhea  or change in bowel or urinary habits, change in stools or urine, dysuria,hematuria,  rash, arthralgias, visual complaints, headache, numbness weakness or ataxia or problems with walking or coordination,  change in mood/affect or memory.                  Objective:   Physical Exam Gen: Pleasant, thin, in no distress    Wt Readings from Last 3 Encounters:  08/05/14 99 lb (44.906 kg)  07/30/14 101 lb (45.813 kg)  03/26/14 104 lb (47.174 kg)    Vital signs reviewed   ENT: No lesions,  mouth clear,  oropharynx clear, no postnasal drip  Neck: No JVD, no TMG, no carotid bruits  Lungs: No use of accessory muscles, Distant mid exp wheeze bilaterally   Cardiovascular: RRR, heart sounds normal, no murmur or gallops, no peripheral edema  Musculoskeletal: No deformities, no cyanosis or clubbing  Neuro: alert, non focal  Distant mid exp wheeze bilaterally   Skin: Warm, no lesions or rashes    CXR PA and Lateral:   08/05/2014 :     I personally reviewed images and agree with radiology impression as follows:   Advanced emphysematous changes and pulmonary scarring but no definite acute pulmonary infiltrates      Assessment & Plan:

## 2014-08-05 NOTE — Assessment & Plan Note (Signed)
Adequate control on present rx, reviewed > no change in rx needed  > sats in upper 80's fine in this setting

## 2014-08-05 NOTE — Progress Notes (Signed)
Quick Note:  Spoke with pt and notified of results per Dr. Wert. Pt verbalized understanding and denied any questions.  ______ 

## 2014-08-08 ENCOUNTER — Other Ambulatory Visit: Payer: Self-pay | Admitting: *Deleted

## 2014-08-08 MED ORDER — FLUTTER DEVI: Status: AC

## 2014-08-22 ENCOUNTER — Ambulatory Visit (INDEPENDENT_AMBULATORY_CARE_PROVIDER_SITE_OTHER): Payer: Medicare Other | Admitting: Adult Health

## 2014-08-22 ENCOUNTER — Encounter: Payer: Self-pay | Admitting: Adult Health

## 2014-08-22 VITALS — BP 112/70 | HR 74 | Temp 97.0°F | Ht 61.0 in | Wt 102.0 lb

## 2014-08-22 DIAGNOSIS — J449 Chronic obstructive pulmonary disease, unspecified: Secondary | ICD-10-CM | POA: Diagnosis not present

## 2014-08-22 DIAGNOSIS — J9612 Chronic respiratory failure with hypercapnia: Secondary | ICD-10-CM

## 2014-08-22 NOTE — Progress Notes (Signed)
Subjective:    Patient ID: Cheyenne [name], female    DOB: 1945/08/06, 69 y.o.   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 Was diagnosed clinically with "asthma" as an adult in 2000 in setting bronchospasm and the flu. Has had similar episodes over the last few yrs, required abx and steroids multiple times. Has been on Advair x 2+ yrs. Also has allergies. Complians today of exhaustion, cough prod of some mucous. Has had some mild epistaxis.   Seen by Dr Zachery DauerBarnes 04/29/09 for acute flare in setting URI. Tretated with pred and abx both are finished, now improved but still with the symptoms above. CXR without infiltrate per pt's report.   ROV 04/11/13 -- GOLD c-d COPD, chronic rhinitis and cough, hypoxemia. I treated her with levafloxacin x 1 week last visit. This was helpful. She is concerned that she is "losing control of her life". She has never been this acutely sick before. She is dealing with the effects her lung disease has had on her relationships, espec her husband.   ROV 12/24/13 -- follow up visit for GOLD c-d COPD, chronic rhinitis and cough, hypoxemia. She is planning to retire. She has been more SOB slowly and progressively. She is affected by stress, weather, exposures. No flares since last time.  She has wheeze, exertional SOB. Wakes up at night with nasal congestion and occlusion.    ROV 01/23/14 -- hx of severe COPD, GOLD c-d, cough due to rhinitis, hypoxemia. She returns after 1 month - we started breo last time for Advair. She likes the delivery system, although it was a little tough to say whether it worked better - she had her Prevnar since last time, she has had more rhinitis. She is on fluticasone nasal spray, one spray daily.   ROV 03/26/14 -- severe COPD, GOLD c-d, cough due to rhinitis, hypoxemia.  She is having some cough, drainage. She likes the [place], remains on Spiriva.  She uses nasal saline, nasal steroid,  chlorpheniramine. No flares, has overall done very well given the severity of her disease.   ROV 07/30/14 -- follow up visit for GOLD d COPD, cough, allergies. Over the last few weeks has had more exertional SOB, chest tightness. She is having some increased mucous. She is on chlorpheniramine, fluticasone qd. Uses spiriva and breo. No Flares. Still working 3 days a week, planning to retire sometime soon.  rec Please continue your current medications as you are taking them.  You may increase your fluticasone to 2 sprays each nostril twice a day  Wear your oxygen at all times.   08/05/2014  Acute  ov/Wert re: cough/ sob GOLD D copd  Chief Complaint  Patient presents with  . Acute Visit    RB pt; trouble catching breath x1week; chest tightness; CP; headaches;no appetite;   worse x sev months, esp last 2 weeks assoc with productive cough mostly darker mucus but nothing really green or yellow  Using proair on avg before got worse, now using every 6 hours  And does help some last used it 2 h. prior to OV    08/22/2014 Follow up : GOLD D COPD and Chronic Hypoxic RF  Patient presents for a two-week follow-up Patient was seen 2 weeks ago for an acute COPD exacerbation She was given doxycycline and a prednisone taper He says she is feeling better but continues to feels weak. Patient does drop her oxygen with  walking on a conserving device at 2 L On continuous flow. There was no desaturations on 2 L. She denies any chest pain, orthopnea, PND, leg swelling or hemoptysis.      Current Medications, Allergies, Complete Past Medical History, Past Surgical History, Family History, and Social History were reviewed in [employer] record.  ROS  The following are not active complaints unless bolded sore throat, dysphagia, dental problems, itching, sneezing,  nasal congestion or excess/ purulent secretions, ear ache,   fever, chills, sweats, unintended wt loss, pleuritic or exertional  cp, hemoptysis,  orthopnea pnd or leg swelling, presyncope, palpitations, heartburn, abdominal pain, anorexia, nausea, vomiting, diarrhea  or change in bowel or urinary habits, change in stools or urine, dysuria,hematuria,  rash, arthralgias, visual complaints, headache, numbness weakness or ataxia or problems with walking or coordination,  change in mood/affect or memory.                  Objective:   Physical Exam Gen: Pleasant, thin, in no distress   Vital signs reviewed   ENT: No lesions,  mouth clear,  oropharynx clear, no postnasal drip  Neck: No JVD, no TMG, no carotid bruits  Lungs: No use of accessory muscles, Distant mid exp wheeze bilaterally   Cardiovascular: RRR, heart sounds normal, no murmur or gallops, no peripheral edema  Musculoskeletal: No deformities, no cyanosis or clubbing  Neuro: alert, non focal  Distant mid exp wheeze bilaterally   Skin: Warm, no lesions or rashes    CXR PA and Lateral:   08/05/2014 :      Advanced emphysematous changes and pulmonary scarring but no definite acute pulmonary infiltrates      Assessment & Plan:

## 2014-08-22 NOTE — Patient Instructions (Signed)
Continue on Spiriva and Breo May use concerning oxygen device at 2 L at rest, when walking use continuous flow at 2 L. Follow-up with Dr. [name] in 2 months and As needed   Please contact office for sooner follow up if symptoms do not improve or worsen or seek emergency care

## 2014-08-22 NOTE — Assessment & Plan Note (Signed)
May use concerning oxygen device at 2 L at rest, when walking use continuous flow at 2 L. Follow-up with Dr. [name] in 2 months and As needed   Please contact office for sooner follow up if symptoms do not improve or worsen or seek emergency care

## 2014-08-22 NOTE — Assessment & Plan Note (Signed)
Continue on Spiriva and Breo Follow-up with Dr. [name] in 2 months and As needed   Please contact office for sooner follow up if symptoms do not improve or worsen or seek emergency care

## 2014-10-04 DIAGNOSIS — H43391 Other vitreous opacities, right eye: Secondary | ICD-10-CM | POA: Diagnosis not present

## 2014-10-04 DIAGNOSIS — H43393 Other vitreous opacities, bilateral: Secondary | ICD-10-CM | POA: Diagnosis not present

## 2014-11-11 DIAGNOSIS — H6121 Impacted cerumen, right ear: Secondary | ICD-10-CM | POA: Diagnosis not present

## 2014-11-11 DIAGNOSIS — H6061 Unspecified chronic otitis externa, right ear: Secondary | ICD-10-CM | POA: Diagnosis not present

## 2014-11-15 ENCOUNTER — Encounter: Payer: Self-pay | Admitting: Emergency Medicine

## 2014-11-15 ENCOUNTER — Ambulatory Visit (INDEPENDENT_AMBULATORY_CARE_PROVIDER_SITE_OTHER): Payer: Medicare Other | Admitting: Emergency Medicine

## 2014-11-15 VITALS — BP 110/70 | HR 91 | Wt 100.0 lb

## 2014-11-15 DIAGNOSIS — J309 Allergic rhinitis, unspecified: Secondary | ICD-10-CM | POA: Diagnosis not present

## 2014-11-15 DIAGNOSIS — J449 Chronic obstructive pulmonary disease, unspecified: Secondary | ICD-10-CM | POA: Diagnosis not present

## 2014-11-15 NOTE — Assessment & Plan Note (Signed)
Continue chlorpheniramine and fluticasone as she has been taking it

## 2014-11-15 NOTE — Patient Instructions (Signed)
Please continue your current medications as you have been taking them  Follow with Dr [name] in 3 months or sooner if you have any problems.

## 2014-11-15 NOTE — Assessment & Plan Note (Signed)
Severe disease, quite labile but does appear to be back to her usual baseline after an acute exacerbation. She is stable on her long-acting bronchodilators, uses albuterol infrequently. 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

## 2014-11-15 NOTE — Progress Notes (Signed)
Subjective:    Patient ID: Cheyenne [name], female    DOB: 27-May-1945, 69 y.o.   MRN: [id number]  HPI 69 yo smoker, former EtOH abuse, hx hypothroidism, DJD, anxiety. Was diagnosed clinically with "asthma" as an adult in 2000 in setting bronchospasm and the flu. Has had similar episodes over the last few yrs, required abx and steroids multiple times. Has been on Advair x 2+ yrs. Also has allergies. Complians today of exhaustion, cough prod of some mucous. Has had some mild epistaxis.   Seen by Dr Zachery Dauer 04/29/09 for acute flare in setting URI. Tretated with pred and abx both are finished, now improved but still with the symptoms above. CXR without infiltrate per pt's report.   ROV 04/11/13 -- GOLD c-d COPD, chronic rhinitis and cough, hypoxemia. I treated her with levafloxacin x 1 week last visit. This was helpful. She is concerned that she is "losing control of her life". She has never been this acutely sick before. She is dealing with the effects her lung disease has had on her relationships, espec her husband.   ROV 12/24/13 -- follow up visit for GOLD c-d COPD, chronic rhinitis and cough, hypoxemia. She is planning to retire. She has been more SOB slowly and progressively. She is affected by stress, weather, exposures. No flares since last time.  She has wheeze, exertional SOB. Wakes up at night with nasal congestion and occlusion.    ROV 01/23/14 -- hx of severe COPD, GOLD c-d, cough due to rhinitis, hypoxemia. She returns after 1 month - we started breo last time for Advair. She likes the delivery system, although it was a little tough to say whether it worked better - she had her Prevnar since last time, she has had more rhinitis. She is on fluticasone nasal spray, one spray daily.   ROV 03/26/14 -- severe COPD, GOLD c-d, cough due to rhinitis, hypoxemia.  She is having some cough, drainage. She likes the [place], remains on Spiriva.  She uses nasal saline, nasal steroid, chlorpheniramine. No flares,  has overall done very well given the severity of her disease.   ROV 07/30/14 -- follow up visit for GOLD d COPD, cough, allergies. Over the last few weeks has had more exertional SOB, chest tightness. She is having some increased mucous. She is on chlorpheniramine, fluticasone qd. Uses spiriva and breo. No Flares. Still working 3 days a week, planning to retire sometime soon.   ROV 11/15/14 -- all of visit for severe COPD, GOLD D disease. Associated chronic hypoxemic respiratory failure. She was treated in April for acute exacerbation. Treated with pred and doxy, given a flutter valve. She has not gone back to work, still has some paperwork brought home to her. This is a better situation.  Her breathing and strength have rebounded. Wearing 2-3L/min.  She is on spiriva and breo reliably. Still using fluticasone and chlorpheniramine. She improved from the flare, feels that she is back to baseline.    Review of Systems As per HPI    Objective:   Physical Exam Filed Vitals:   11/15/14 1559  BP: 110/70  Pulse: 91  Weight: 100 lb (45.36 kg)  SpO2: 94%   Gen: Pleasant, thin, in no distress   ENT: No lesions,  mouth clear,  oropharynx clear, no postnasal drip  Neck: No JVD, no TMG, no carotid bruits  Lungs: No use of accessory muscles, no wheezing  Cardiovascular: RRR, heart sounds normal, no murmur or gallops, no peripheral edema  Musculoskeletal: No  deformities, no cyanosis or clubbing  Neuro: alert, non focal  Skin: Warm, no lesions or rashes   CT chest 11/21/12 --   Comparison: 05/12/2012  Findings:  There is no pleural effusion identified. Moderate to advanced  changes of centrilobular emphysema identified. Right upper lung  nodule measures 5 mm, image 28/series 3. Previously this measured  the same. Right lower lobe nodule measures 7 mm, image 35/series  3. Also stable from previous exam. In the left lower lobe there  is a stable 5 mm nodule, image 51/series 3.  The heart size  is normal. Prominent coronary artery calcifications  are noted involving the RCA, LAD and left circumflex coronary  artery. No pericardial effusion. No enlarged mediastinal or hilar  lymph nodes. No supraclavicular or axillary adenopathy identified.  Review of the visualized osseous structures is significant for  multilevel spondylosis. No aggressive lytic or sclerotic bone  lesions identified.  Limited imaging through the upper abdomen shows calcified  atherosclerotic disease affecting the abdominal aorta. No aneurysm  identified.  IMPRESSION:  1. No acute cardiopulmonary abnormalities.  2. Bilateral pulmonary nodules are stable from 05/12/2012. In a  patient that is at increased risk for lung cancer been next follow-  up examination should be obtained at 9-12 months.  3. Prominent coronary artery calcifications.   Assessment & Plan:  COPD III/ D  Severe disease, quite labile but does appear to be back to her usual baseline after an acute exacerbation. She is stable on her long-acting bronchodilators, uses albuterol infrequently. 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  Allergic rhinitis Continue chlorpheniramine and fluticasone as she has been taking it

## 2014-12-24 ENCOUNTER — Other Ambulatory Visit: Payer: Self-pay | Admitting: Emergency Medicine

## 2015-01-02 ENCOUNTER — Other Ambulatory Visit: Payer: Self-pay | Admitting: Internal Medicine

## 2015-01-17 ENCOUNTER — Other Ambulatory Visit: Payer: Self-pay | Admitting: Emergency Medicine

## 2015-01-21 ENCOUNTER — Other Ambulatory Visit: Payer: Self-pay | Admitting: Emergency Medicine

## 2015-01-28 ENCOUNTER — Other Ambulatory Visit: Payer: Self-pay | Admitting: Internal Medicine

## 2015-02-18 ENCOUNTER — Encounter: Payer: Self-pay | Admitting: Emergency Medicine

## 2015-02-18 ENCOUNTER — Ambulatory Visit (INDEPENDENT_AMBULATORY_CARE_PROVIDER_SITE_OTHER): Payer: Medicare Other | Admitting: Emergency Medicine

## 2015-02-18 VITALS — BP 106/70 | HR 90 | Ht 61.0 in | Wt 106.0 lb

## 2015-02-18 DIAGNOSIS — Z23 Encounter for immunization: Secondary | ICD-10-CM | POA: Diagnosis not present

## 2015-02-18 DIAGNOSIS — J449 Chronic obstructive pulmonary disease, unspecified: Secondary | ICD-10-CM

## 2015-02-18 NOTE — Progress Notes (Signed)
Subjective:    Patient ID: Cheyenne [name], female    DOB: 09-Aug-1945, 69 y.o.   MRN: [id number]  HPI 69 yo smoker, former EtOH abuse, hx hypothroidism, DJD, anxiety. Was diagnosed clinically with "asthma" as an adult in 2000 in setting bronchospasm and the flu. Has had similar episodes over the last few yrs, required abx and steroids multiple times. Has been on Advair x 2+ yrs. Also has allergies. Complians today of exhaustion, cough prod of some mucous. Has had some mild epistaxis.   Seen by Dr Zachery Dauer 04/29/09 for acute flare in setting URI. Tretated with pred and abx both are finished, now improved but still with the symptoms above. CXR without infiltrate per pt's report.   ROV 07/30/14 -- follow up visit for GOLD d COPD, cough, allergies. Over the last few weeks has had more exertional SOB, chest tightness. She is having some increased mucous. She is on chlorpheniramine, fluticasone qd. Uses spiriva and breo. No Flares. Still working 3 days a week, planning to retire sometime soon.   ROV 11/15/14 -- all of visit for severe COPD, GOLD D disease. Associated chronic hypoxemic respiratory failure. She was treated in April for acute exacerbation. Treated with pred and doxy, given a flutter valve. She has not gone back to work, still has some paperwork brought home to her. This is a better situation.  Her breathing and strength have rebounded. Wearing 2-3L/min.  She is on spiriva and breo reliably. Still using fluticasone and chlorpheniramine. She improved from the flare, feels that she is back to baseline.   ROV 02/18/15-- follow-up visit for gold D COPD with associated chronic hypoxemic respiratory failure. Last seen 3 months ago not long after an exacerbation.  She reports more exertional SOB. On 2 occasions she has fallen climbing stairs, and then once again when in the kitchen. She does have dizziness with exertion. She remains on breo and spiriva, uses proair several times a week. Unclear that the  proair.  She has gained some weight. O2 at 2-3L/min. She c/o extreme fatigue. Using flonase, chlorpheniramine.    CAT Score 02/21/2013 04/28/2012  Total CAT Score 16 16    Review of Systems As per HPI    Objective:   Physical Exam Filed Vitals:   02/18/15 1500  BP: 106/70  Pulse: 90  Height:  (1.549 m)  Weight: 106 lb (48.081 kg)  SpO2: 90%   Gen: Pleasant, thin, in no distress   ENT: No lesions,  mouth clear,  oropharynx clear, no postnasal drip  Neck: No JVD, no TMG, no carotid bruits  Lungs: No use of accessory muscles, no wheezing  Cardiovascular: RRR, heart sounds normal, no murmur or gallops, no peripheral edema  Musculoskeletal: No deformities, no cyanosis or clubbing  Neuro: alert, non focal  Skin: Warm, no lesions or rashes   CT chest 11/21/12 --   Comparison: 05/12/2012  Findings:  There is no pleural effusion identified. Moderate to advanced  changes of centrilobular emphysema identified. Right upper lung  nodule measures 5 mm, image 28/series 3. Previously this measured  the same. Right lower lobe nodule measures 7 mm, image 35/series  3. Also stable from previous exam. In the left lower lobe there  is a stable 5 mm nodule, image 51/series 3.  The heart size is normal. Prominent coronary artery calcifications  are noted involving the RCA, LAD and left circumflex coronary  artery. No pericardial effusion. No enlarged mediastinal or hilar  lymph nodes. No supraclavicular or axillary  adenopathy identified.  Review of the visualized osseous structures is significant for  multilevel spondylosis. No aggressive lytic or sclerotic bone  lesions identified.  Limited imaging through the upper abdomen shows calcified  atherosclerotic disease affecting the abdominal aorta. No aneurysm  identified.  IMPRESSION:  1. No acute cardiopulmonary abnormalities.  2. Bilateral pulmonary nodules are stable from 05/12/2012. In a  patient that is at increased risk for  lung cancer been next follow-  up examination should be obtained at 9-12 months.  3. Prominent coronary artery calcifications.   Assessment & Plan:  COPD  D  Please continue your current medications as you have been taking them Consider adding loratadine 10 mg daily to your current regimen of fluticasone nasal spray and chlorpheniramine.  Continue oxygen at 2-3 L/min Flu shot today Follow with Dr [name] in 3 months or sooner if you have any problems.

## 2015-02-18 NOTE — Assessment & Plan Note (Signed)
Please continue your current medications as you have been taking them Consider adding loratadine 10 mg daily to your current regimen of fluticasone nasal spray and chlorpheniramine.  Continue oxygen at 2-3 L/min Flu shot today Follow with Dr Arius Harnois in 3 months or sooner if you have any problems.  

## 2015-02-18 NOTE — Patient Instructions (Addendum)
Please continue your current medications as you have been taking them Consider adding loratadine 10 mg daily to your current regimen of fluticasone nasal spray and chlorpheniramine.  Continue oxygen at 2-3 L/min Flu shot today Follow with Dr [name] in 3 months or sooner if you have any problems.

## 2015-02-26 DIAGNOSIS — M5416 Radiculopathy, lumbar region: Secondary | ICD-10-CM | POA: Diagnosis not present

## 2015-04-10 DIAGNOSIS — J449 Chronic obstructive pulmonary disease, unspecified: Secondary | ICD-10-CM | POA: Diagnosis not present

## 2015-04-10 DIAGNOSIS — Z79899 Other long term (current) drug therapy: Secondary | ICD-10-CM | POA: Diagnosis not present

## 2015-04-10 DIAGNOSIS — E559 Vitamin D deficiency, unspecified: Secondary | ICD-10-CM | POA: Diagnosis not present

## 2015-04-10 DIAGNOSIS — E039 Hypothyroidism, unspecified: Secondary | ICD-10-CM | POA: Diagnosis not present

## 2015-04-10 DIAGNOSIS — D510 Vitamin B12 deficiency anemia due to intrinsic factor deficiency: Secondary | ICD-10-CM | POA: Diagnosis not present

## 2015-04-10 DIAGNOSIS — F339 Major depressive disorder, recurrent, unspecified: Secondary | ICD-10-CM | POA: Diagnosis not present

## 2015-05-22 ENCOUNTER — Ambulatory Visit (INDEPENDENT_AMBULATORY_CARE_PROVIDER_SITE_OTHER): Payer: Medicare Other | Admitting: Emergency Medicine

## 2015-05-22 ENCOUNTER — Encounter: Payer: Self-pay | Admitting: Emergency Medicine

## 2015-05-22 VITALS — BP 100/64 | HR 85 | Ht 60.0 in | Wt 106.0 lb

## 2015-05-22 DIAGNOSIS — J209 Acute bronchitis, unspecified: Secondary | ICD-10-CM | POA: Diagnosis not present

## 2015-05-22 DIAGNOSIS — J449 Chronic obstructive pulmonary disease, unspecified: Secondary | ICD-10-CM

## 2015-05-22 MED ORDER — DOXYCYCLINE HYCLATE 100 MG PO TABS: 100.0000 mg | ORAL_TABLET | Freq: Two times a day (BID) | ORAL | Status: DC

## 2015-05-22 NOTE — Assessment & Plan Note (Signed)
Without bronchospasm. Will treat w doxy, avoid pred for now.

## 2015-05-22 NOTE — Patient Instructions (Signed)
Please start doxycycline  twice a day for 7 days.  Try using mucinex  every day for the next 2 weeks. Then go back to using just as needed.  Please continue your other medications as you have been taking them Follow with Dr [name] in 3 months or sooner if you have any problems.

## 2015-05-22 NOTE — Assessment & Plan Note (Signed)
Acute bronchitis without wheeze. abx as above. Continue same BD's and o2.

## 2015-05-22 NOTE — Progress Notes (Signed)
Subjective:    Patient ID: Cheyenne [name], female    DOB: 1945-05-01, 70 y.o.   MRN: [id number]  HPI 70 yo smoker, former EtOH abuse, hx hypothroidism, DJD, anxiety. Was diagnosed clinically with "asthma" as an adult in 2000 in setting bronchospasm and the flu. Has had similar episodes over the last few yrs, required abx and steroids multiple times. Has been on Advair x 2+ yrs. Also has allergies. Complians today of exhaustion, cough prod of some mucous. Has had some mild epistaxis.   Seen by Dr Zachery Dauer 04/29/09 for acute flare in setting URI. Tretated with pred and abx both are finished, now improved but still with the symptoms above. CXR without infiltrate per pt's report.   ROV 07/30/14 -- follow up visit for GOLD d COPD, cough, allergies. Over the last few weeks has had more exertional SOB, chest tightness. She is having some increased mucous. She is on chlorpheniramine, fluticasone qd. Uses spiriva and breo. No Flares. Still working 3 days a week, planning to retire sometime soon.   ROV 11/15/14 -- all of visit for severe COPD, GOLD D disease. Associated chronic hypoxemic respiratory failure. She was treated in April for acute exacerbation. Treated with pred and doxy, given a flutter valve. She has not gone back to work, still has some paperwork brought home to her. This is a better situation.  Her breathing and strength have rebounded. Wearing 2-3L/min.  She is on spiriva and breo reliably. Still using fluticasone and chlorpheniramine. She improved from the flare, feels that she is back to baseline.   ROV 02/18/15-- follow-up visit for gold D COPD with associated chronic hypoxemic respiratory failure. Last seen 3 months ago not long after an exacerbation.  She reports more exertional SOB. On 2 occasions she has fallen climbing stairs, and then once again when in the kitchen. She does have dizziness with exertion. She remains on breo and spiriva, uses proair several times a week. Unclear that the  proair.  She has gained some weight. O2 at 2-3L/min. She c/o extreme fatigue. Using flonase, chlorpheniramine.   ROV 05/22/15 -- follow-up visit for Gold stage D COPD with associated chronic hypoxemic respiratory failure.  She has been having a lot of exertional SOB, more than her baseline over the last few months. She is having some thicker darker mucous, green/yel;low, seems to wake her at night and also need to clear every morning. She had some fever about 3 weeks ago, none currently.  Clear mucous from her nose. On spiriva, breo, albuterol prn - once a day.  Uses mucinex prn, not recently. She has some dizziness on standing.    CAT Score 02/21/2013 04/28/2012  Total CAT Score 16 16   Gt  Review of Systems As per HPI    Objective:   Physical Exam Filed Vitals:   05/22/15 1547  BP: 100/64  Pulse: 85  Height: 5' (1.524 m)  Weight: 106 lb (48.081 kg)  SpO2: 92%   Gen: Pleasant, thin, in no distress   ENT: No lesions,  mouth clear,  oropharynx clear, no postnasal drip  Neck: No JVD, no TMG, no carotid bruits  Lungs: No use of accessory muscles, no wheezing  Cardiovascular: RRR, heart sounds normal, no murmur or gallops, no peripheral edema  Musculoskeletal: No deformities, no cyanosis or clubbing  Neuro: awake, she is a bit slower to respond today.   Skin: Warm, no lesions or rashes   CT chest 11/21/12 --   Comparison: 05/12/2012  Findings:  There is no pleural effusion identified. Moderate to advanced  changes of centrilobular emphysema identified. Right upper lung  nodule measures 5 mm, image 28/series 3. Previously this measured  the same. Right lower lobe nodule measures 7 mm, image 35/series  3. Also stable from previous exam. In the left lower lobe there  is a stable 5 mm nodule, image 51/series 3.  The heart size is normal. Prominent coronary artery calcifications  are noted involving the RCA, LAD and left circumflex coronary  artery. No pericardial effusion. No  enlarged mediastinal or hilar  lymph nodes. No supraclavicular or axillary adenopathy identified.  Review of the visualized osseous structures is significant for  multilevel spondylosis. No aggressive lytic or sclerotic bone  lesions identified.  Limited imaging through the upper abdomen shows calcified  atherosclerotic disease affecting the abdominal aorta. No aneurysm  identified.  IMPRESSION:  1. No acute cardiopulmonary abnormalities.  2. Bilateral pulmonary nodules are stable from 05/12/2012. In a  patient that is at increased risk for lung cancer been next follow-  up examination should be obtained at 9-12 months.  3. Prominent coronary artery calcifications.   Assessment & Plan:  COPD  D  Acute bronchitis without wheeze. abx as above. Continue same BD's and o2.   Acute bronchitis Without bronchospasm. Will treat w doxy, avoid pred for now.

## 2015-05-28 ENCOUNTER — Other Ambulatory Visit: Payer: Self-pay | Admitting: Emergency Medicine

## 2015-06-18 ENCOUNTER — Other Ambulatory Visit: Payer: Self-pay | Admitting: Emergency Medicine

## 2015-06-23 DIAGNOSIS — F3341 Major depressive disorder, recurrent, in partial remission: Secondary | ICD-10-CM | POA: Diagnosis not present

## 2015-06-23 DIAGNOSIS — Z9181 History of falling: Secondary | ICD-10-CM | POA: Diagnosis not present

## 2015-07-09 ENCOUNTER — Ambulatory Visit: Payer: Medicare Other | Admitting: Physical Therapy

## 2015-07-14 DIAGNOSIS — E039 Hypothyroidism, unspecified: Secondary | ICD-10-CM | POA: Diagnosis not present

## 2015-07-14 DIAGNOSIS — J440 Chronic obstructive pulmonary disease with acute lower respiratory infection: Secondary | ICD-10-CM | POA: Diagnosis not present

## 2015-07-14 DIAGNOSIS — R296 Repeated falls: Secondary | ICD-10-CM | POA: Diagnosis not present

## 2015-07-14 DIAGNOSIS — F339 Major depressive disorder, recurrent, unspecified: Secondary | ICD-10-CM | POA: Diagnosis not present

## 2015-07-15 ENCOUNTER — Other Ambulatory Visit: Payer: Self-pay | Admitting: Emergency Medicine

## 2015-07-22 ENCOUNTER — Encounter: Payer: Self-pay | Admitting: Physical Therapy

## 2015-07-22 ENCOUNTER — Ambulatory Visit: Payer: Medicare Other | Attending: Family Medicine | Admitting: Physical Therapy

## 2015-07-22 VITALS — HR 95

## 2015-07-22 DIAGNOSIS — R262 Difficulty in walking, not elsewhere classified: Secondary | ICD-10-CM | POA: Insufficient documentation

## 2015-07-22 DIAGNOSIS — M6281 Muscle weakness (generalized): Secondary | ICD-10-CM | POA: Insufficient documentation

## 2015-07-22 DIAGNOSIS — R2681 Unsteadiness on feet: Secondary | ICD-10-CM | POA: Diagnosis not present

## 2015-07-25 NOTE — Therapy (Signed)
[facility] [address] [place], [place], [address] Phone: [phone]   Fax:  [phone]  Physical Therapy Evaluation  Patient Details  Name: Cheyenne [name] MRN: [id number] Date of Birth: 1945-07-01 Referring Provider: Dr. Gweneth Dimitri  Encounter Date: 07/22/2015      PT End of Session - 07/25/15 1008    Visit Number 1   Number of Visits 9   Date for PT Re-Evaluation 08/24/15   Authorization Type Medicare   Authorization Time Period 07-22-15 - 09-20-15   PT Start Time 1103   PT Stop Time 1147   PT Time Calculation (min) 44 min      Past Medical History  Diagnosis Date  . Anxiety   . Alcohol abuse   . Vitamin B12 deficiency   . Osteopenia   . DJD (degenerative joint disease)   . Hypothyroidism   . Depression   . Asthma   . Psoriatic arthritis (HCC) 06/02/2009    Past Surgical History  Procedure Laterality Date  . Cataract extraction, bilateral    . Vitrectomy      right  . Spine surgery  05/25/10    lesion removed from t11- Dr. Adelene Idler Vitals:   07/22/15 1109  Pulse: 95  SpO2: 85%    Visit Diagnosis:  Difficulty in walking, not elsewhere classified - Plan: PT plan of care cert/re-cert  Unsteadiness on feet - Plan: PT plan of care cert/re-cert  Muscle weakness (generalized) - Plan: PT plan of care cert/re-cert      Subjective Assessment - 07/25/15 1000    Subjective Pt is a 70 yr lady with COPD - on  2L of oxygen; reports most fecent fall occurred within past month when she was assisting husband in removing light fixture in [place] - fell off ladder:  reports mobility has declined since she had pneumonia in Jan. '17                                                                                                      Pertinent History Emphysema, COPD, bronchitis; pneumonia in Jan. 2017   Patient Stated Goals Improve balance and steadiness with ambulation            [facility] PT  Assessment - 07/25/15 0001    Assessment   Medical Diagnosis Deconditioning due to pneumonia and bronchitis;  COPD   Referring Provider Dr. Gweneth Dimitri   Onset Date/Surgical Date --  Jan. 2017   Balance Screen   Has the patient fallen in the past 6 months Yes   How many times? 2   Has the patient had a decrease in activity level because of a fear of falling?  Yes   Is the patient reluctant to leave their home because of a fear of falling?  No   Home Nurse, mental health Private residence   Home Access Stairs to enter   Entrance Stairs-Number of Steps 4   Entrance Stairs-Rails None   Home Layout Two level   Strength   Overall Strength Within  functional limits for tasks performed   Ambulation/Gait   Ambulation/Gait Yes   Ambulation Distance (Feet) 100 Feet   Assistive device Straight cane   Gait Pattern Within Functional Limits   Ambulation Surface Level;Indoor   Gait Comments pt on 3L oxygen with ambulation   Berg Balance Test   Sit to Stand Able to stand without using hands and stabilize independently  1st attempt LOB posteriorly   Standing Unsupported Able to stand safely 2 minutes   Sitting with Back Unsupported but Feet Supported on Floor or Stool Able to sit safely and securely 2 minutes   Stand to Sit Sits safely with minimal use of hands   Transfers Able to transfer safely, minor use of hands   Standing Unsupported with Eyes Closed Able to stand 10 seconds safely   Standing Ubsupported with Feet Together Able to place feet together independently and stand 1 minute safely   From Standing, Reach Forward with Outstretched Arm Can reach confidently >25 cm (10")   From Standing Position, Pick up Object from Floor Able to pick up shoe safely and easily   From Standing Position, Turn to Look Behind Over each Shoulder Looks behind from both sides and weight shifts well   Turn 360 Degrees Able to turn 360 degrees safely but slowly  L side = 4.53  R side 6.63    Standing Unsupported, Alternately Place Feet on Step/Stool Able to complete >2 steps/needs minimal assist   Standing Unsupported, One Foot in Front Able to plae foot ahead of the other independently and hold 30 seconds   Standing on One Leg Able to lift leg independently and hold 5-10 seconds   Total Score 49   Timed Up and Go Test   Normal TUG (seconds) 16.62                                PT Long Term Goals - 07/25/15 1022    PT LONG TERM GOAL #1   Title Improve TUG score to </= 13.5 secs to decr. fall risk.  (08-24-15)   Baseline 16.62   Time 4   Period Weeks   Status New   PT LONG TERM GOAL #2   Title Increase Berg balance test score to >/= 51/56 to decr. fall risk.  (08-24-15)   Baseline 49/56 on 07-22-15   Time 4   Period Weeks   Status New   PT LONG TERM GOAL #3   Title Incr. distance in 3" walk test by at least 25' for incr. gait efficiency.  (08-24-15)   Baseline 3" walk test to be completed next session   Time 4   Period Weeks   Status New   PT LONG TERM GOAL #4   Title Independent in HEP for balance exercises.  (08-24-15)   Time 4   Period Weeks   Status New               Plan - 07/25/15 1012    Clinical Impression Statement Pt is a 70 year old lady with c/o deconditioning and unsteadiness with ambulation that has worsened since onset of pneumonia in Jan. 2017.  Pt has COPD and is currently on 2L of oxygen but increased this to 3L during evaluation activites.  Pt also has osteopenia, DJD , psoriatic arthritis and had a lesion removed from her spine in Jan. 2012.  She reports she fell on ice in 2003 and broke  her back.  She has been on oxygen since 2011.  She states she gets dizzy when her O2 level drops.  She has not driven since Feb. 2017.  Moderate complexity decision making required with POC.    Pt will benefit from skilled therapeutic intervention in order to improve on the following deficits Dizziness;Cardiopulmonary status limiting  activity;Decreased activity tolerance;Decreased balance;Decreased endurance;Difficulty walking;Decreased strength;Decreased mobility   Rehab Potential Good   PT Frequency 2x / week   PT Duration 4 weeks   PT Treatment/Interventions ADLs/Self Care Home Management;Gait training;Stair training;Functional mobility training;Therapeutic activities;Therapeutic exercise;Balance training;Patient/family education;Neuromuscular re-education   PT Next Visit Plan balance HEP instruction: do 3" walk test and gait velocity   PT Home Exercise Plan balance HEP   Consulted and Agree with Plan of Care Patient         Problem List Patient Active Problem List   Diagnosis Date Noted  . Acute bronchitis 05/22/2015  . Chronic respiratory failure with hypercapnia (HCC) 08/05/2014  . Allergic rhinitis 01/23/2014  . Lung nodule 05/04/2012  . COPD  D  06/02/2009  . HYPOTHYROIDISM 05/07/2009  . VITAMIN B12 DEFICIENCY 05/07/2009  . ANXIETY 05/07/2009  . ALCOHOL ABUSE 05/07/2009  . DEPRESSION 05/07/2009  . ASTHMA 05/07/2009  . DEGENERATIVE JOINT DISEASE 05/07/2009  . OSTEOPENIA 05/07/2009    Kary Kosilday, Deion Swift Suzanne, PT 07/25/2015, 10:32 AM  [facility] [address] [place], [place], [address] Phone: [phone]   Fax:  [phone]  Name: Cheyenne [name] MRN: [id number] Date of Birth: May 06, 1945

## 2015-07-31 ENCOUNTER — Ambulatory Visit: Payer: Medicare Other | Admitting: Physical Therapy

## 2015-07-31 DIAGNOSIS — N898 Other specified noninflammatory disorders of vagina: Secondary | ICD-10-CM | POA: Diagnosis not present

## 2015-07-31 DIAGNOSIS — R05 Cough: Secondary | ICD-10-CM | POA: Diagnosis not present

## 2015-07-31 DIAGNOSIS — R197 Diarrhea, unspecified: Secondary | ICD-10-CM | POA: Diagnosis not present

## 2015-08-11 ENCOUNTER — Encounter: Payer: Self-pay | Admitting: Physical Therapy

## 2015-08-11 ENCOUNTER — Ambulatory Visit: Payer: Medicare Other | Attending: Family Medicine | Admitting: Physical Therapy

## 2015-08-11 DIAGNOSIS — R2681 Unsteadiness on feet: Secondary | ICD-10-CM

## 2015-08-11 DIAGNOSIS — M6281 Muscle weakness (generalized): Secondary | ICD-10-CM | POA: Diagnosis not present

## 2015-08-11 DIAGNOSIS — R262 Difficulty in walking, not elsewhere classified: Secondary | ICD-10-CM | POA: Insufficient documentation

## 2015-08-11 NOTE — Patient Instructions (Addendum)
Feet Together (Compliant Surface) Head Motion - Eyes Closed    Stand in a corner with chair in front level surface: with feet together. Close eyes and move head slowly, up and down, sideways, diagonals Repeat __10__ times per session. Do __1-2__ sessions per day.  Copyright  VHI. All rights reserved.

## 2015-08-11 NOTE — Therapy (Signed)
[facility] [address] [place], [place], [address] Phone: [phone]   Fax:  [phone]  Physical Therapy Treatment  Patient Details  Name: Cheyenne [name] MRN: [id number] Date of Birth: 10/04/1945 Referring Provider: Dr. Gweneth Dimitri  Encounter Date: 08/11/2015      PT End of Session - 08/11/15 1605    Visit Number 2   Number of Visits 9   Date for PT Re-Evaluation 08/24/15   Authorization Type Medicare   Authorization Time Period 07-22-15 - 09-20-15   Activity Tolerance No increased pain;Patient tolerated treatment well      Past Medical History  Diagnosis Date  . Anxiety   . Alcohol abuse   . Vitamin B12 deficiency   . Osteopenia   . DJD (degenerative joint disease)   . Hypothyroidism   . Depression   . Asthma   . Psoriatic arthritis (HCC) 06/02/2009    Past Surgical History  Procedure Laterality Date  . Cataract extraction, bilateral    . Vitrectomy      right  . Spine surgery  05/25/10    lesion removed from t11- Dr. Adelene Idler Vitals:   08/11/15 1250 08/11/15 1251 08/11/15 1256 08/11/15 1257  SpO2: 79% 94% 89% 95%        Subjective Assessment - 08/11/15 1247    Subjective Seeing Pulmonologist tomorrow. Right foot drags sometimes' "from back" during gait.   Pertinent History Emphysema, COPD, bronchitis; pneumonia in Jan. 2017   Patient Stated Goals Improve balance and steadiness with ambulation   Currently in Pain? Yes   Pain Score 3    Pain Location Back   Pain Orientation Posterior   Pain Radiating Towards up towards neck and shoulders   Pain Onset More than a month ago   Pain Frequency Intermittent                         [facility] Adult PT Treatment/Exercise - 08/11/15 0001    Ambulation/Gait   Ambulation/Gait Yes: 40' + 120'   Gait velocity 2.11ft/sec   Gait Comments pt on 3L oxygen with ambulation  right foot drag at times with gait.   Knee/Hip Exercises:  Aerobic   Nustep L=1       Corner balance exercises consisting of: Standing on  *level** surface with feet *apart** progressing *with feet together** with eyes *open** progressing with eyes closed with chair for safety. Performed head- turns, nods, diagonals *intermittant** UE support Performed at **supervision* level.            PT Education - 08/11/15 1604    Education provided Yes   Education Details HEP for standing balance.   Person(s) Educated Patient   Methods Explanation;Demonstration;Tactile cues;Verbal cues;Handout   Comprehension Verbalized understanding;Returned demonstration;Verbal cues required;Tactile cues required;Need further instruction             PT Long Term Goals - 07/25/15 1022    PT LONG TERM GOAL #1   Title Improve TUG score to </= 13.5 secs to decr. fall risk.  (08-24-15)   Baseline 16.62   Time 4   Period Weeks   Status New   PT LONG TERM GOAL #2   Title Increase Berg balance test score to >/= 51/56 to decr. fall risk.  (08-24-15)   Baseline 49/56 on 07-22-15   Time 4   Period Weeks   Status New   PT LONG TERM GOAL #3   Title  Incr. distance in 3" walk test by at least 75' for incr. gait efficiency.  (08-24-15)   Baseline 3" walk test to be completed next session   Time 4   Period Weeks   Status New   PT LONG TERM GOAL #4   Title Independent in HEP for balance exercises.  (08-24-15)   Time 4   Period Weeks   Status New               Plan - 08/11/15 1605    Clinical Impression Statement Pt tolerated Nu-step activity well being able to do 10 min continuously maintianing SPO2 close to or above 90% on 3 L.  Initiated HEP for balance vestibular exercises standing in the corner; challenged with feet together, eyes closed.   Rehab Potential Good   PT Frequency 2x / week   PT Duration 4 weeks   PT Treatment/Interventions ADLs/Self Care Home Management;Gait training;Stair training;Functional mobility training;Therapeutic  activities;Therapeutic exercise;Balance training;Patient/family education;Neuromuscular re-education   PT Next Visit Plan Review balance HEP instruction: do 3" walk test    PT Home Exercise Plan balance HEP   Consulted and Agree with Plan of Care Patient      Patient will benefit from skilled therapeutic intervention in order to improve the following deficits and impairments:  Dizziness, Cardiopulmonary status limiting activity, Decreased activity tolerance, Decreased balance, Decreased endurance, Difficulty walking, Decreased strength, Decreased mobility  Visit Diagnosis: Muscle weakness (generalized)  Difficulty in walking, not elsewhere classified  Unsteadiness on feet     Problem List Patient Active Problem List   Diagnosis Date Noted  . Acute bronchitis 05/22/2015  . Chronic respiratory failure with hypercapnia (HCC) 08/05/2014  . Allergic rhinitis 01/23/2014  . Lung nodule 05/04/2012  . COPD  D  06/02/2009  . HYPOTHYROIDISM 05/07/2009  . VITAMIN B12 DEFICIENCY 05/07/2009  . ANXIETY 05/07/2009  . ALCOHOL ABUSE 05/07/2009  . DEPRESSION 05/07/2009  . ASTHMA 05/07/2009  . DEGENERATIVE JOINT DISEASE 05/07/2009  . OSTEOPENIA 05/07/2009   Hortencia ConradiKarissa Ariela Mochizuki, PTA  08/11/2015, 4:13 PM  [facility] [address] [place], [place], [address] Phone: [phone]   Fax:  [phone]  Name: Cheyenne [name] MRN: [id number] Date of Birth: 11/02/1945

## 2015-08-12 ENCOUNTER — Encounter: Payer: Self-pay | Admitting: Emergency Medicine

## 2015-08-12 ENCOUNTER — Ambulatory Visit (INDEPENDENT_AMBULATORY_CARE_PROVIDER_SITE_OTHER): Payer: Medicare Other | Admitting: Emergency Medicine

## 2015-08-12 VITALS — BP 98/60 | HR 80 | Wt 104.0 lb

## 2015-08-12 DIAGNOSIS — J449 Chronic obstructive pulmonary disease, unspecified: Secondary | ICD-10-CM | POA: Diagnosis not present

## 2015-08-12 DIAGNOSIS — J309 Allergic rhinitis, unspecified: Secondary | ICD-10-CM | POA: Diagnosis not present

## 2015-08-12 NOTE — Patient Instructions (Addendum)
Please continue your Spiriva and Breo as you are taking them  Take albuterol 2 puffs up to every 4 hours if needed for shortness of breath.  Continue your oxygen at all times.  We may want to consider referral to Pulmonary rehab at some point.  Continue flonase nasal spray, chlorpheniramine tabs and mucinex.  Follow with Dr [name] in 3 months or sooner if you have any problems.

## 2015-08-12 NOTE — Assessment & Plan Note (Signed)
Continue flonase nasal spray, chlorpheniramine tabs and mucinex.

## 2015-08-12 NOTE — Progress Notes (Signed)
Subjective:    Patient ID: Cheyenne [name], female    DOB: 1945-12-29, 70 y.o.   MRN: [id number]  HPI 71 yo smoker, former EtOH abuse, hx hypothroidism, DJD, anxiety. Was diagnosed clinically with "asthma" as an adult in 2000 in setting bronchospasm and the flu. Has had similar episodes over the last few yrs, required abx and steroids multiple times. Has been on Advair x 2+ yrs. Also has allergies. Complians today of exhaustion, cough prod of some mucous. Has had some mild epistaxis.   Seen by Dr Zachery Dauer 04/29/09 for acute flare in setting URI. Tretated with pred and abx both are finished, now improved but still with the symptoms above. CXR without infiltrate per pt's report.   ROV 07/30/14 -- follow up visit for GOLD d COPD, cough, allergies. Over the last few weeks has had more exertional SOB, chest tightness. She is having some increased mucous. She is on chlorpheniramine, fluticasone qd. Uses spiriva and breo. No Flares. Still working 3 days a week, planning to retire sometime soon.   ROV 11/15/14 -- all of visit for severe COPD, GOLD D disease. Associated chronic hypoxemic respiratory failure. She was treated in April for acute exacerbation. Treated with pred and doxy, given a flutter valve. She has not gone back to work, still has some paperwork brought home to her. This is a better situation.  Her breathing and strength have rebounded. Wearing 2-3L/min.  She is on spiriva and breo reliably. Still using fluticasone and chlorpheniramine. She improved from the flare, feels that she is back to baseline.   ROV 02/18/15-- follow-up visit for gold D COPD with associated chronic hypoxemic respiratory failure. Last seen 3 months ago not long after an exacerbation.  She reports more exertional SOB. On 2 occasions she has fallen climbing stairs, and then once again when in the kitchen. She does have dizziness with exertion. She remains on breo and spiriva, uses proair several times a week. Unclear that the  proair.  She has gained some weight. O2 at 2-3L/min. She c/o extreme fatigue. Using flonase, chlorpheniramine.   ROV 05/22/15 -- follow-up visit for Gold stage D COPD with associated chronic hypoxemic respiratory failure.  She has been having a lot of exertional SOB, more than her baseline over the last few months. She is having some thicker darker mucous, green/yel;low, seems to wake her at night and also need to clear every morning. She had some fever about 3 weeks ago, none currently.  Clear mucous from her nose. On spiriva, breo, albuterol prn - once a day.  Uses mucinex prn, not recently. She has some dizziness on standing.   ROV 08/12/15 -- patient with a history of severe COPD with significant limitation and associated with chronic hypoxemic respiratory failure. She was last seen in January. She had been doing fairly well, but tells me that she has had more trouble over the last several weeks. She fell off a ladder in February, did not go to be evaluated.  She remains on Spiriva and breo. She is wearing o2 at . She is having more cough, clear mucous. She has flonase and chlorpheniramine. She was treated for bronchitis twice in March - April w abx, and pred taper.    CAT Score 02/21/2013 04/28/2012  Total CAT Score 16 16   Gt  Review of Systems As per HPI    Objective:   Physical Exam Filed Vitals:   08/12/15 1621  BP: 98/60  Pulse: 80  Weight: 104 lb (47.174 kg)  SpO2: 92%   Gen: Pleasant, thin, some mild resp distress, some difficulty completing a full sentence.   ENT: No lesions,  mouth clear,  oropharynx clear, no postnasal drip  Neck: No JVD, no TMG, no carotid bruits  Lungs: No use of accessory muscles, no wheezing  Cardiovascular: RRR, heart sounds normal, no murmur or gallops, no peripheral edema  Musculoskeletal: No deformities, no cyanosis or clubbing  Neuro: awake, she is a bit slower to respond today.   Skin: Warm, no lesions or rashes   CT chest 11/21/12 --    Comparison: 05/12/2012  Findings:  There is no pleural effusion identified. Moderate to advanced  changes of centrilobular emphysema identified. Right upper lung  nodule measures 5 mm, image 28/series 3. Previously this measured  the same. Right lower lobe nodule measures 7 mm, image 35/series  3. Also stable from previous exam. In the left lower lobe there  is a stable 5 mm nodule, image 51/series 3.  The heart size is normal. Prominent coronary artery calcifications  are noted involving the RCA, LAD and left circumflex coronary  artery. No pericardial effusion. No enlarged mediastinal or hilar  lymph nodes. No supraclavicular or axillary adenopathy identified.  Review of the visualized osseous structures is significant for  multilevel spondylosis. No aggressive lytic or sclerotic bone  lesions identified.  Limited imaging through the upper abdomen shows calcified  atherosclerotic disease affecting the abdominal aorta. No aneurysm  identified.  IMPRESSION:  1. No acute cardiopulmonary abnormalities.  2. Bilateral pulmonary nodules are stable from 05/12/2012. In a  patient that is at increased risk for lung cancer been next follow-  up examination should be obtained at 9-12 months.  3. Prominent coronary artery calcifications.   Assessment & Plan:  COPD  D  Please continue your Spiriva and Breo as you are taking them  Take albuterol 2 puffs up to every 4 hours if needed for shortness of breath.  Continue your oxygen at all times.  We may want to consider referral to Pulmonary rehab at some point.  Follow with Dr [name] in 3 months or sooner if you have any problems.  Allergic rhinitis Continue flonase nasal spray, chlorpheniramine tabs and mucinex.

## 2015-08-12 NOTE — Assessment & Plan Note (Signed)
Please continue your Spiriva and Breo as you are taking them  Take albuterol 2 puffs up to every 4 hours if needed for shortness of breath.  Continue your oxygen at all times.  We may want to consider referral to Pulmonary rehab at some point.  Follow with Dr [name] in 3 months or sooner if you have any problems.

## 2015-08-13 ENCOUNTER — Encounter: Payer: Self-pay | Admitting: Physical Therapy

## 2015-08-13 ENCOUNTER — Ambulatory Visit: Payer: Medicare Other | Admitting: Physical Therapy

## 2015-08-13 VITALS — HR 80

## 2015-08-13 DIAGNOSIS — M6281 Muscle weakness (generalized): Secondary | ICD-10-CM | POA: Diagnosis not present

## 2015-08-13 DIAGNOSIS — R262 Difficulty in walking, not elsewhere classified: Secondary | ICD-10-CM

## 2015-08-13 DIAGNOSIS — R2681 Unsteadiness on feet: Secondary | ICD-10-CM | POA: Diagnosis not present

## 2015-08-13 NOTE — Therapy (Signed)
[facility] [address] [place], [place], [address] Phone: [phone]   Fax:  [phone]  Physical Therapy Treatment  Patient Details  Name: Cheyenne [name] MRN: [id number] Date of Birth: 1946-01-25 Referring Provider: Dr. Gweneth [name]  Encounter Date: 08/13/2015      PT End of Session - 08/13/15 1555    Visit Number 3   Number of Visits 9   Date for PT Re-Evaluation 08/24/15   Authorization Type Medicare   Authorization Time Period 07-22-15 - 09-20-15   PT Start Time 1445   PT Stop Time 1530   PT Time Calculation (min) 45 min   Activity Tolerance Patient limited by fatigue;Treatment limited secondary to medical complications (Comment)   Behavior During Therapy Anxious      Past Medical History  Diagnosis Date  . Anxiety   . Alcohol abuse   . Vitamin B12 deficiency   . Osteopenia   . DJD (degenerative joint disease)   . Hypothyroidism   . Depression   . Asthma   . Psoriatic arthritis (HCC) 06/02/2009    Past Surgical History  Procedure Laterality Date  . Cataract extraction, bilateral    . Vitrectomy      right  . Spine surgery  05/25/10    lesion removed from t11- Dr. Adelene Idler Vitals:   08/13/15 1520 08/13/15 1521 08/13/15 1524 08/13/15 1548  Pulse: 120 94 104 80  SpO2: 70% 84% 92% 77%        Subjective Assessment - 08/13/15 1516    Subjective Saw Pulmonologist yesterday and stated that pt can titrate O2 PRN to keep SPO2 above 90%.  Pt came into today without an AD.   Pertinent History Emphysema, COPD, bronchitis; pneumonia in Jan. 2017   Patient Stated Goals Improve balance and steadiness with ambulation   Currently in Pain? Yes   Pain Score 4    Pain Location Back   Pain Orientation Posterior   Pain Onset More than a month ago   Pain Frequency Intermittent   Aggravating Factors  standing on her feet   Pain Relieving Factors seated rest                          [facility] Adult PT Treatment/Exercise - 08/13/15 0001    Knee/Hip Exercises: Aerobic   Nustep L3  SPO2 increase to >90% during activity on 3LO2                PT Education - 08/13/15 1552    Education provided Yes   Education Details Importance oF continually monitoring SPO2 throughout the day and the dangers on having SPO2 to low,  benefit of seated cardiovascular activity like nustep, safety and energy conservation issues re using rollator during gait.   Person(s) Educated Patient   Methods Explanation   Comprehension Verbalized understanding;Need further instruction             PT Long Term Goals - 07/25/15 1022    PT LONG TERM GOAL #1   Title Improve TUG score to </= 13.5 secs to decr. fall risk.  (08-24-15)   Baseline 16.62   Time 4   Period Weeks   Status New   PT LONG TERM GOAL #2   Title Increase Berg balance test score to >/= 51/56 to decr. fall risk.  (08-24-15)   Baseline 49/56 on 07-22-15   Time 4   Period Weeks  Status New   PT LONG TERM GOAL #3   Title Incr. distance in 3" walk test by at least 2475' for incr. gait efficiency.  (08-24-15)   Baseline 3" walk test to be completed next session   Time 4   Period Weeks   Status New   PT LONG TERM GOAL #4   Title Independent in HEP for balance exercises.  (08-24-15)   Time 4   Period Weeks   Status New               Plan - 08/13/15 1557    Clinical Impression Statement Pt's SPO2 was extremely low when walking into gym.  Pt was on 2L02 but stated that MD told her to increase O2 PRN.  Pt increased to 3L after being reminded to follow MD orders.  Pt responded well to Nustep activity on 3LO2 but at the end of session during seated rest pt's  SPO2 decreased below 90% again and pt had to increase O2 to 4L.                                                                                      Rehab Potential Good   PT Frequency 2x / week   PT Duration 4 weeks   PT Treatment/Interventions ADLs/Self Care  Home Management;Gait training;Stair training;Functional mobility training;Therapeutic activities;Therapeutic exercise;Balance training;Patient/family education;Neuromuscular re-education   PT Next Visit Plan Review balance HEP instruction: do 3" walk test ; monitor O2 sats.   PT Home Exercise Plan balance HEP   Consulted and Agree with Plan of Care Patient      Patient will benefit from skilled therapeutic intervention in order to improve the following deficits and impairments:  Dizziness, Cardiopulmonary status limiting activity, Decreased activity tolerance, Decreased balance, Decreased endurance, Difficulty walking, Decreased strength, Decreased mobility  Visit Diagnosis: Muscle weakness (generalized)  Difficulty in walking, not elsewhere classified  Unsteadiness on feet     Problem List Patient Active Problem List   Diagnosis Date Noted  . Acute bronchitis 05/22/2015  . Chronic respiratory failure with hypercapnia (HCC) 08/05/2014  . Allergic rhinitis 01/23/2014  . Lung nodule 05/04/2012  . COPD  D  06/02/2009  . HYPOTHYROIDISM 05/07/2009  . VITAMIN B12 DEFICIENCY 05/07/2009  . ANXIETY 05/07/2009  . ALCOHOL ABUSE 05/07/2009  . DEPRESSION 05/07/2009  . ASTHMA 05/07/2009  . DEGENERATIVE JOINT DISEASE 05/07/2009  . OSTEOPENIA 05/07/2009    Hortencia ConradiKarissa Aashritha Miedema, PTA  08/13/2015, 4:04 PM [facility] [address] [place], [place], [address] Phone: [phone]   Fax:  [phone]  Name: Cheyenne [name] MRN: [id number] Date of Birth: 1946-02-06

## 2015-08-18 ENCOUNTER — Ambulatory Visit: Payer: Medicare Other | Admitting: Physical Therapy

## 2015-08-20 ENCOUNTER — Encounter: Payer: Self-pay | Admitting: Adult Health

## 2015-08-20 NOTE — Telephone Encounter (Signed)
4.26.17 e-mail from pt: Message     Hi [name], I decided to cancel my neuro rehab and when next I come to see Dr. [name], he can order pulmonary rehab if he thinks it's helpful.  I'm getting too short of breath and experiencing too much pain at this time. Thank you for passing this on to Dr. Inis SizerBryum and next time, will see him before seeing my primary. the fall I took is causing me to continue pain and the weather brings asthma attacks. Thanks, Cheyenne [name].   Pt last seen 4.18.17 by RB: Patient Instructions     Please continue your Spiriva and Breo as you are taking them  Take albuterol 2 puffs up to every 4 hours if needed for shortness of breath.  Continue your oxygen at all times.  We may want to consider referral to Pulmonary rehab at some point.  Continue flonase nasal spray, chlorpheniramine tabs and mucinex.  Follow with Dr [name] in 3 months or sooner if you have any problems.   E-mail sent to pt to see if she needs to be seen Will go ahead and forward to RB to inform him pt cancelled her neuro rehab appt

## 2015-08-21 ENCOUNTER — Ambulatory Visit: Payer: Medicare Other | Admitting: Physical Therapy

## 2015-08-25 ENCOUNTER — Ambulatory Visit: Payer: Medicare Other | Admitting: Physical Therapy

## 2015-08-28 ENCOUNTER — Ambulatory Visit: Payer: Medicare Other | Admitting: Physical Therapy

## 2015-11-11 DIAGNOSIS — H6121 Impacted cerumen, right ear: Secondary | ICD-10-CM | POA: Diagnosis not present

## 2015-11-13 ENCOUNTER — Encounter: Payer: Self-pay | Admitting: Internal Medicine

## 2015-11-13 ENCOUNTER — Ambulatory Visit (INDEPENDENT_AMBULATORY_CARE_PROVIDER_SITE_OTHER): Payer: Medicare Other | Admitting: Internal Medicine

## 2015-11-13 ENCOUNTER — Ambulatory Visit (INDEPENDENT_AMBULATORY_CARE_PROVIDER_SITE_OTHER)
Admission: RE | Admit: 2015-11-13 | Discharge: 2015-11-13 | Disposition: A | Discharge status: Home / Self Care | Payer: Medicare Other | Source: Ambulatory Visit | Attending: Internal Medicine | Admitting: Internal Medicine

## 2015-11-13 ENCOUNTER — Ambulatory Visit: Payer: Medicare Other | Admitting: Pulmonary Disease

## 2015-11-13 VITALS — BP 106/64 | HR 87 | Ht 60.5 in | Wt 101.2 lb

## 2015-11-13 DIAGNOSIS — J441 Chronic obstructive pulmonary disease with (acute) exacerbation: Secondary | ICD-10-CM

## 2015-11-13 DIAGNOSIS — J9612 Chronic respiratory failure with hypercapnia: Secondary | ICD-10-CM | POA: Diagnosis not present

## 2015-11-13 DIAGNOSIS — R0602 Shortness of breath: Secondary | ICD-10-CM | POA: Diagnosis not present

## 2015-11-13 MED ORDER — PREDNISONE 10 MG PO TABS: ORAL_TABLET | ORAL | Status: DC

## 2015-11-13 MED ORDER — DOXYCYCLINE HYCLATE 100 MG PO TABS: 100.0000 mg | ORAL_TABLET | Freq: Two times a day (BID) | ORAL | Status: DC

## 2015-11-13 NOTE — Progress Notes (Signed)
Subjective:   Patient ID: Cheyenne [name], female    DOB: [birth year],     MRN: [id number]  HPI [age] yowf quit smoking 2011 EtOH abuse, hx hypothroidism, DJD, anxiety. Was diagnosed clinically with "asthma" as an adult in 2000 in setting bronchospasm and the flu. Has had similar episodes over the last few yrs, required abx and steroids multiple times. Has been on Advair x 2+ yrs. Also has allergies. Complians today of exhaustion, cough prod of some mucous. Has had some mild epistaxis.   Seen by Dr Zachery DauerBarnes 04/29/09 for acute flare in setting URI. Tretated with pred and abx both are finished, now improved but still with the symptoms above. CXR without infiltrate per pt's report.    Last Byrum ov ROV 08/12/15 -- patient with a history of severe COPD with significant limitation and associated with chronic hypoxemic respiratory failure. She was last seen in January. She had been doing fairly well, but tells me that she has had more trouble over the last several weeks. She fell off a ladder in February, did not go to be evaluated.  She remains on Spiriva and breo. She is wearing o2 at . She is having more cough, clear mucous. She has flonase and chlorpheniramine. She was treated for bronchitis twice in March - April w abx, and pred taper. rec Please continue your Spiriva and Breo as you are taking them  Take albuterol 2 puffs up to every 4 hours if needed for shortness of breath.  Continue your oxygen at all times.  We may want to consider referral to Pulmonary rehab at some point.  Continue flonase nasal spray, chlorpheniramine tabs and mucinex.    11/13/2015 acute extended ov/[place]en [place]out re:  Last smoked x Jan 2011 GOLD III/D Severe copd/ 02 2lpm 24/7 and spir/breo  Chief Complaint  Patient presents with  . Acute Visit    Pt c/o increased SOB x 2 months. She also c/o sore throat and cough for the past few wks. Her cough is prod at times with thick, yellow sputum.    Indolent onset grad worse Doe now = MMRC4  =  sob if tries to leave home or while getting dressed  - assoc with worse cough / congestion/ not using flutter/ proair helps  A little though hfa poor   No obvious day to day or daytime variability or assoc chronic cough or cp or chest tightness, subjective wheeze or overt sinus or hb symptoms. No unusual exp hx or h/o childhood pna/ asthma or knowledge of premature birth.  Sleeping ok without nocturnal  or early am exacerbation  of respiratory  c/o's or need for noct saba. Also denies any obvious fluctuation of symptoms with weather or environmental changes or other aggravating or alleviating factors except as outlined above   Current Medications, Allergies, Complete Past Medical History, Past Surgical History, Family History, and Social History were reviewed in [facility] electronic medical record.  ROS  The following are not active complaints unless bolded sore throat, dysphagia, dental problems, itching, sneezing,  nasal congestion or excess/ purulent secretions, ear ache,   fever, chills, sweats, unintended wt loss, classically pleuritic or exertional cp, hemoptysis,  orthopnea pnd or leg swelling, presyncope, palpitations, abdominal pain, anorexia, nausea, vomiting, diarrhea  or change in bowel or bladder habits, change in stools or urine, dysuria,hematuria,  rash, arthralgias, visual complaints, headache, numbness, weakness or ataxia or problems with walking or coordination,  change in mood/affect or memory.  Objective:   Physical Exam    Using Rollator/ elderly wf nad one person assist to get on exam table   Wt Readings from Last 3 Encounters:  11/13/15 101 lb 3.2 oz (45.904 kg)  08/12/15 104 lb (47.174 kg)  05/22/15 106 lb (48.081 kg)    Vital signs reviewed/  sats on 2lpm pulsed with sat 95% on arrival    Gen: Pleasant, thin, some mild resp distress, some difficulty completing a full sentence.   ENT: No lesions,  mouth clear,  oropharynx clear, no postnasal  drip  Neck: No JVD, no TMG, no carotid bruits  Lungs: No use of accessory muscles,  Mid exp rhonchi bilaterally   Cardiovascular: RRR, heart sounds normal, no murmur or gallops, no peripheral edema  Musculoskeletal: No deformities, no cyanosis or clubbing  Neuro: awake, she is a bit slower to respond today.   Skin: Warm, no lesions or rashes     CXR PA and Lateral:   11/13/2015 :    I personally reviewed images and agree with radiology impression as follows:    Marked hyperinflation consistent with COPD. No pneumonia, CHF, nor other acute cardiopulmonary abnormality.  Aortic atherosclerosis.    Assessment & Plan:

## 2015-11-13 NOTE — Patient Instructions (Addendum)
Prednisone 10 mg take  4 each am x 2 days,   2 each am x 2 days,  1 each am x 2 days and stop   Doxycline 100 mg twice daily before meals for glass of water  For cough mucinex dm 1200 mg every 12 hours and use the flutter valve as much as possible    For breathing > proair up to 2 puffs every 4 hours as needed   Work on inhaler technique:  relax and gently blow all the way out then take a nice smooth deep breath back in, triggering the inhaler at same time you start breathing in.  Hold for up to 5 seconds if you can. Blow out thru nose. Rinse and gargle with water when done      Please remember to go to the  x-ray department downstairs for your tests - we will call you with the results when they are available.     Please schedule a follow up office visit in 4 weeks, sooner if needed to See [name] or one NP's for ? Nebulizer

## 2015-11-13 NOTE — Progress Notes (Signed)
Quick Note:  Spoke with pt and notified of results per Dr. Wert. Pt verbalized understanding and denied any questions.  ______ 

## 2015-11-14 ENCOUNTER — Encounter: Payer: Self-pay | Admitting: Internal Medicine

## 2015-11-14 NOTE — Assessment & Plan Note (Signed)
pfts 2/011   FEV1 0.79 (40%) ratio 33   - The proper method of use, as well as anticipated side effects, of a metered-dose inhaler are discussed and demonstrated to the patient. Improved effectiveness after extensive coaching during this visit to a level of approximately 75 % from a baseline of 25 % ? Needs dpi rescue (respiclick)  or change to neb?   Typical copd exac assoc with change in sputum and increased need for saba prob related to tracheobronchitis r/o bacterial causes but probably viral or non-specific.  I had an extended discussion with the patient reviewing all relevant studies completed to date and  lasting 25 minutes of a 40  minute acute office visit to address new symptoms refractory to rescue rx  rec Prednisone 10 mg take  4 each am x 2 days,   2 each am x 2 days,  1 each am x 2 days and stop   Doxycline 100 mg twice daily before meals for glass of water  For cough mucinex dm 1200 mg every 12 hours and use the flutter valve as much as possible    For breathing > proair up to 2 puffs every 4 hours as needed   Each maintenance medication was reviewed in detail including most importantly the difference between maintenance and prns and under what circumstances the prns are to be triggered using an action plan format that is not reflected in the computer generated alphabetically organized AVS.    Please see instructions for details which were reviewed in writing and the patient given a copy highlighting the part that I personally wrote and discussed at today's ov.

## 2015-11-14 NOTE — Assessment & Plan Note (Signed)
Adequate control on present rx, reviewed > no change in rx needed   

## 2015-12-10 ENCOUNTER — Other Ambulatory Visit: Payer: Self-pay | Admitting: Emergency Medicine

## 2015-12-11 ENCOUNTER — Ambulatory Visit (INDEPENDENT_AMBULATORY_CARE_PROVIDER_SITE_OTHER): Payer: Medicare Other | Admitting: Adult Health

## 2015-12-11 ENCOUNTER — Encounter: Payer: Self-pay | Admitting: Adult Health

## 2015-12-11 DIAGNOSIS — J9612 Chronic respiratory failure with hypercapnia: Secondary | ICD-10-CM | POA: Diagnosis not present

## 2015-12-11 DIAGNOSIS — J441 Chronic obstructive pulmonary disease with (acute) exacerbation: Secondary | ICD-10-CM

## 2015-12-11 NOTE — Progress Notes (Signed)
Subjective:    Patient ID: Cheyenne [name], female    DOB: [birth year], 70 y.o.   MRN: [id number]  HPI 70 yo female former smoker followed for severe COPD -O2 dependent  Hx of ETOH abuse   12/11/2015 Follow up : COPD  She presents for a one-month follow-up. Patient was recently seen for a COPD exacerbation. She was treated with doxycycline and a prednisone taper. Patient says she is feeling improved. She has decreased cough and congestion. Shortness of breath is returned back to baseline He denies any chest pain, orthopnea, PND, or increased leg swelling.   Past Medical History:  Diagnosis Date  . Alcohol abuse   . Anxiety   . Asthma   . Depression   . DJD (degenerative joint disease)   . Hypothyroidism   . Osteopenia   . Psoriatic arthritis (HCC) 06/02/2009  . Vitamin B12 deficiency    Current Outpatient Prescriptions on File Prior to Visit  Medication Sig Dispense Refill  . BREO ELLIPTA 100-25 MCG/INH AEPB INHALE ONE PUFF INTO THE LUNGS ONCE DAILY 60 each 5  . buPROPion (WELLBUTRIN XL) 150 MG 24 hr tablet Take 150 mg by mouth daily.    . calcium carbonate (OS-CAL) 600 MG TABS Take 600 mg by mouth daily.      . chlorpheniramine (CHLOR-TRIMETON) 4 MG tablet Take 1 tablet (4 mg total) by mouth every 12 (twelve) hours as needed for allergies. 60 tablet 0  . citalopram (CELEXA) 10 MG tablet Take 20 mg by mouth daily. Unknown dose    . fluticasone (FLONASE) 50 MCG/ACT nasal spray PLACE 2 SPRAYS INTO BOTH NOSTRILS DAILY 16 g 5  . Glucosamine 500 MG CAPS Take by mouth daily.      Marland Kitchen. HYDROcodone-acetaminophen (VICODIN) 5-500 MG per tablet Take 1 tablet by mouth every 6 (six) hours as needed. For pain     . levothyroxine (SYNTHROID, LEVOTHROID) 50 MCG tablet Take 50 mcg by mouth daily.      . methylcellulose (ARTIFICIAL TEARS) 1 % ophthalmic solution as directed.      . Omega-3 Fatty Acids (FISH OIL) 1000 MG CAPS Take by mouth daily.      . OXYGEN 24/7- 2lpm with sleep and 2-4 with  exertion    . PROAIR HFA 108 (90 Base) MCG/ACT inhaler INHALE UP TO 2 PUFFS EVERY 4 HOURS AS NEEDED 8.5 g 5  . Respiratory Therapy Supplies (FLUTTER) DEVI Use as directed 1 each 0  . SPIRIVA HANDIHALER 18 MCG inhalation capsule USE ONE (1) CAPSULE INHALATION ONCE DAILY 30 capsule 5   No current facility-administered medications on file prior to visit.       Review of Systems Constitutional:   No  weight loss, night sweats,  Fevers, chills,  +fatigue, or  lassitude.  HEENT:   No headaches,  Difficulty swallowing,  Tooth/dental problems, or  Sore throat,                No sneezing, itching, ear ache, nasal congestion, post nasal drip,   CV:  No chest pain,  Orthopnea, PND, swelling in lower extremities, anasarca, dizziness, palpitations, syncope.   GI  No heartburn, indigestion, abdominal pain, nausea, vomiting, diarrhea, change in bowel habits, loss of appetite, bloody stools.   Resp:    No chest wall deformity  Skin: no rash or lesions.  GU: no dysuria, change in color of urine, no urgency or frequency.  No flank pain, no hematuria   MS:  No joint pain or  swelling.  No decreased range of motion.  No back pain.  Psych:  No change in mood or affect. No depression or anxiety.  No memory loss.         Objective:   Physical Exam Vitals:   12/11/15 1517  BP: 112/64  Pulse: 78  Temp: 97.4 F (36.3 C)  TempSrc: Oral  SpO2: 97%  Weight: 100 lb (45.4 kg)  Height: 5' (1.524 m)   GEN: A/Ox3; pleasant , NAD, elderly on O2    HEENT:  [place]/AT,  EACs-clear, TMs-wnl, NOSE-clear, THROAT-clear, no lesions, no postnasal drip or exudate noted.   NECK:  Supple w/ fair ROM; no JVD; normal carotid impulses w/o bruits; no thyromegaly or nodules palpated; no lymphadenopathy.    RESP  Decreased BS in bases w/o , wheezes/ rales/ or rhonchi. no accessory muscle use, no dullness to percussion  CARD:  RRR, no m/r/g  , no peripheral edema, pulses intact, no cyanosis or clubbing.  GI:   Soft  & nt; nml bowel sounds; no organomegaly or masses detected.   Musco: Warm bil, no deformities or joint swelling noted.   Neuro: alert, no focal deficits noted.    Skin: Warm, no lesions or rashes  [name] Parrett NP-C  [facility]  12/11/2015

## 2015-12-11 NOTE — Assessment & Plan Note (Signed)
Cont on o2 .  

## 2015-12-11 NOTE — Assessment & Plan Note (Signed)
Recent exacerbation, now resolved  Plan  Patient Instructions  Continue on Spiriva and Breo Continue on Oyxgen .  Follow-up with Dr. [name] in 2- 3 months and As needed   Please contact office for sooner follow up if symptoms do not improve or worsen or seek emergency care

## 2015-12-11 NOTE — Patient Instructions (Addendum)
Continue on Spiriva and Breo Continue on Oyxgen .  Follow-up with Dr. [name] in 2- 3 months and As needed   Please contact office for sooner follow up if symptoms do not improve or worsen or seek emergency care

## 2015-12-30 DIAGNOSIS — D510 Vitamin B12 deficiency anemia due to intrinsic factor deficiency: Secondary | ICD-10-CM | POA: Diagnosis not present

## 2015-12-30 DIAGNOSIS — Z23 Encounter for immunization: Secondary | ICD-10-CM | POA: Diagnosis not present

## 2015-12-30 DIAGNOSIS — E559 Vitamin D deficiency, unspecified: Secondary | ICD-10-CM | POA: Diagnosis not present

## 2015-12-30 DIAGNOSIS — J440 Chronic obstructive pulmonary disease with acute lower respiratory infection: Secondary | ICD-10-CM | POA: Diagnosis not present

## 2015-12-30 DIAGNOSIS — F339 Major depressive disorder, recurrent, unspecified: Secondary | ICD-10-CM | POA: Diagnosis not present

## 2015-12-30 DIAGNOSIS — F419 Anxiety disorder, unspecified: Secondary | ICD-10-CM | POA: Diagnosis not present

## 2015-12-30 DIAGNOSIS — E039 Hypothyroidism, unspecified: Secondary | ICD-10-CM | POA: Diagnosis not present

## 2016-01-08 DIAGNOSIS — M546 Pain in thoracic spine: Secondary | ICD-10-CM | POA: Diagnosis not present

## 2016-01-26 DIAGNOSIS — H16223 Keratoconjunctivitis sicca, not specified as Sjogren's, bilateral: Secondary | ICD-10-CM | POA: Diagnosis not present

## 2016-02-10 ENCOUNTER — Ambulatory Visit (INDEPENDENT_AMBULATORY_CARE_PROVIDER_SITE_OTHER): Payer: Medicare Other | Admitting: Emergency Medicine

## 2016-02-10 ENCOUNTER — Ambulatory Visit (INDEPENDENT_AMBULATORY_CARE_PROVIDER_SITE_OTHER)
Admission: RE | Admit: 2016-02-10 | Discharge: 2016-02-10 | Disposition: A | Discharge status: Home / Self Care | Payer: Medicare Other | Source: Ambulatory Visit | Attending: Emergency Medicine | Admitting: Emergency Medicine

## 2016-02-10 ENCOUNTER — Encounter: Payer: Self-pay | Admitting: Emergency Medicine

## 2016-02-10 DIAGNOSIS — J441 Chronic obstructive pulmonary disease with (acute) exacerbation: Secondary | ICD-10-CM

## 2016-02-10 DIAGNOSIS — R05 Cough: Secondary | ICD-10-CM | POA: Diagnosis not present

## 2016-02-10 DIAGNOSIS — R079 Chest pain, unspecified: Secondary | ICD-10-CM | POA: Diagnosis not present

## 2016-02-10 MED ORDER — PREDNISONE 10 MG PO TABS: ORAL_TABLET | ORAL | 0 refills | Status: DC

## 2016-02-10 MED ORDER — LEVOFLOXACIN 500 MG PO TABS: 500.0000 mg | ORAL_TABLET | Freq: Every day | ORAL | 0 refills | Status: DC

## 2016-02-10 NOTE — Addendum Note (Signed)
Addended by: Jaynee EaglesLEMONS, Rosene Pilling C on: 02/10/2016 02:06 PM   Modules accepted: Orders

## 2016-02-10 NOTE — Patient Instructions (Addendum)
Please continue your Breo and Spiriva as you are taking them  Take albuterol 2 puffs up to every 4 hours if needed for shortness of breath.  Please take prednisone as directed until completely gone Please take levaquin for 7 days until completely gone.  CXR today.  Follow with Dr [name] or NP in 3-4 weeks.

## 2016-02-10 NOTE — Assessment & Plan Note (Signed)
Acute flare of her severe COPD. This is the second flare and approximately 4 months. We will have to sort out whether she needs any other medications after the flare is cleared. Maybe she needs additional prednisone every day. She may also benefit from daliresp. Treat flare now and then follow her up to determine whether to make chronic changes.   Please continue your Breo and Spiriva as you are taking them  Take albuterol 2 puffs up to every 4 hours if needed for shortness of breath.  Please take prednisone as directed until completely gone Please take levaquin for 7 days until completely gone.  CXR today.  Follow with Dr [name] or NP in 3-4 weeks.

## 2016-02-10 NOTE — Progress Notes (Signed)
Subjective:    Patient ID: Cheyenne [name], female    DOB: 09-23-1945, 70 y.o.   MRN: [id number]  HPI 70 yo smoker, former EtOH abuse, hx hypothroidism, DJD, anxiety. Was diagnosed clinically with "asthma" as an adult in 2000 in setting bronchospasm and the flu. Has had similar episodes over the last few yrs, required abx and steroids multiple times. Has been on Advair x 2+ yrs. Also has allergies. Complians today of exhaustion, cough prod of some mucous. Has had some mild epistaxis.   Seen by Dr Zachery DauerBarnes 04/29/09 for acute flare in setting URI. Tretated with pred and abx both are finished, now improved but still with the symptoms above. CXR without infiltrate per pt's report.   ROV 07/30/14 -- follow up visit for GOLD d COPD, cough, allergies. Over the last few weeks has had more exertional SOB, chest tightness. She is having some increased mucous. She is on chlorpheniramine, fluticasone qd. Uses spiriva and breo. No Flares. Still working 3 days a week, planning to retire sometime soon.   ROV 11/15/14 -- all of visit for severe COPD, GOLD D disease. Associated chronic hypoxemic respiratory failure. She was treated in April for acute exacerbation. Treated with pred and doxy, given a flutter valve. She has not gone back to work, still has some paperwork brought home to her. This is a better situation.  Her breathing and strength have rebounded. Wearing 2-3L/min.  She is on spiriva and breo reliably. Still using fluticasone and chlorpheniramine. She improved from the flare, feels that she is back to baseline.   ROV 02/18/15-- follow-up visit for gold D COPD with associated chronic hypoxemic respiratory failure. Last seen 3 months ago not long after an exacerbation.  She reports more exertional SOB. On 2 occasions she has fallen climbing stairs, and then once again when in the kitchen. She does have dizziness with exertion. She remains on breo and spiriva, uses proair several times a week. Unclear that the  proair.  She has gained some weight. O2 at 2-3L/min. She c/o extreme fatigue. Using flonase, chlorpheniramine.   ROV 05/22/15 -- follow-up visit for Gold stage D COPD with associated chronic hypoxemic respiratory failure.  She has been having a lot of exertional SOB, more than her baseline over the last few months. She is having some thicker darker mucous, green/yel;low, seems to wake her at night and also need to clear every morning. She had some fever about 3 weeks ago, none currently.  Clear mucous from her nose. On spiriva, breo, albuterol prn - once a day.  Uses mucinex prn, not recently. She has some dizziness on standing.   ROV 08/12/15 -- patient with a history of severe COPD with significant limitation and associated with chronic hypoxemic respiratory failure. She was last seen in January. She had been doing fairly well, but tells me that she has had more trouble over the last several weeks. She fell off a ladder in February, did not go to be evaluated.  She remains on Spiriva and breo. She is wearing o2 at . She is having more cough, clear mucous. She has flonase and chlorpheniramine. She was treated for bronchitis twice in March - April w abx, and pred taper.   ROV 02/10/16 -- this follow-up visit for history of severe COPD with associated chronic hypoxemic respiratory failure. She was treated 11/13/15 with antibiotics and steroids for an acute exacerbation of her COPD. She returns today for regular maintenance visit.  She reports that for the last couple  weeks she has been worse - more sinus fullness and congestion, associated with HA. Over the last 2-3 days much more cough, occas productive Thick phlegm, gray. Maybe some blood in it 2 weeks ago. She has been weak. She fell once since last visit, she feels that imbalance contributed. She has more wheeze, dyspnea. Her o2 is on 3L/min.                                                                                                                                                                                         CAT Score 02/21/2013 04/28/2012  Total CAT Score 16 16   Gt  Review of Systems As per HPI    Objective:   Physical Exam Vitals:   02/10/16 1347  BP: 104/68  BP Location: Left Arm  Cuff Size: Normal  Pulse: 82  Temp: 97.5 F (36.4 C)  TempSrc: Oral  SpO2: 94%  Weight: 101 lb 9.6 oz (46.1 kg)  Height: 5' (1.524 m)   Gen: Pleasant, thin, some mild resp distress, some difficulty completing a full sentence.   ENT: No lesions,  mouth clear,  oropharynx clear, no postnasal drip  Neck: No JVD, no TMG, no carotid bruits  Lungs: No use of accessory muscles, no wheezing  Cardiovascular: RRR, heart sounds normal, no murmur or gallops, no peripheral edema  Musculoskeletal: No deformities, no cyanosis or clubbing  Neuro: awake, she is a bit slower to respond today.   Skin: Warm, no lesions or rashes   CT chest 11/21/12 --   Comparison: 05/12/2012  Findings:  There is no pleural effusion identified. Moderate to advanced  changes of centrilobular emphysema identified. Right upper lung  nodule measures 5 mm, image 28/series 3. Previously this measured  the same. Right lower lobe nodule measures 7 mm, image 35/series  3. Also stable from previous exam. In the left lower lobe there  is a stable 5 mm nodule, image 51/series 3.  The heart size is normal. Prominent coronary artery calcifications  are noted involving the RCA, LAD and left circumflex coronary  artery. No pericardial effusion. No enlarged mediastinal or hilar  lymph nodes. No supraclavicular or axillary adenopathy identified.  Review of the visualized osseous structures is significant for  multilevel spondylosis. No aggressive lytic or sclerotic bone  lesions identified.  Limited imaging through the upper abdomen shows calcified  atherosclerotic disease affecting the abdominal aorta. No aneurysm  identified.  IMPRESSION:  1. No acute cardiopulmonary  abnormalities.  2. Bilateral pulmonary nodules are stable from 05/12/2012. In a  patient that is at increased risk for lung cancer been next follow-  up examination should  be obtained at 9-12 months.  3. Prominent coronary artery calcifications.   Assessment & Plan:  COPD exacerbation (HCC) Acute flare of her severe COPD. This is the second flare and approximately 4 months. We will have to sort out whether she needs any other medications after the flare is cleared. Maybe she needs additional prednisone every day. She may also benefit from daliresp. Treat flare now and then follow her up to determine whether to make chronic changes.   Please continue your Breo and Spiriva as you are taking them  Take albuterol 2 puffs up to every 4 hours if needed for shortness of breath.  Please take prednisone as directed until completely gone Please take levaquin for 7 days until completely gone.  CXR today.  Follow with Dr [name] or NP in 3-4 weeks.  Levy Pupa, MD, PhD 02/10/2016, 2:02 PM [facility] [phone] or if no answer [phone]

## 2016-02-12 DIAGNOSIS — H6061 Unspecified chronic otitis externa, right ear: Secondary | ICD-10-CM | POA: Diagnosis not present

## 2016-02-12 DIAGNOSIS — H6121 Impacted cerumen, right ear: Secondary | ICD-10-CM | POA: Diagnosis not present

## 2016-02-23 ENCOUNTER — Other Ambulatory Visit: Payer: Self-pay | Admitting: Emergency Medicine

## 2016-03-03 ENCOUNTER — Emergency Department (HOSPITAL_COMMUNITY)
Admission: EM | Admit: 2016-03-03 | Discharge: 2016-03-03 | Disposition: A | Discharge status: Home / Self Care | Payer: Medicare Other | Attending: Emergency Medicine | Admitting: Emergency Medicine

## 2016-03-03 ENCOUNTER — Emergency Department (HOSPITAL_COMMUNITY): Payer: Medicare Other

## 2016-03-03 ENCOUNTER — Encounter (HOSPITAL_COMMUNITY): Payer: Self-pay | Admitting: Emergency Medicine

## 2016-03-03 ENCOUNTER — Ambulatory Visit: Payer: Medicare Other | Admitting: Emergency Medicine

## 2016-03-03 DIAGNOSIS — S42351A Displaced comminuted fracture of shaft of humerus, right arm, initial encounter for closed fracture: Secondary | ICD-10-CM | POA: Diagnosis not present

## 2016-03-03 DIAGNOSIS — Z79899 Other long term (current) drug therapy: Secondary | ICD-10-CM | POA: Insufficient documentation

## 2016-03-03 DIAGNOSIS — Y92009 Unspecified place in unspecified non-institutional (private) residence as the place of occurrence of the external cause: Secondary | ICD-10-CM | POA: Insufficient documentation

## 2016-03-03 DIAGNOSIS — E039 Hypothyroidism, unspecified: Secondary | ICD-10-CM | POA: Insufficient documentation

## 2016-03-03 DIAGNOSIS — W109XXA Fall (on) (from) unspecified stairs and steps, initial encounter: Secondary | ICD-10-CM | POA: Diagnosis not present

## 2016-03-03 DIAGNOSIS — S42391A Other fracture of shaft of right humerus, initial encounter for closed fracture: Secondary | ICD-10-CM | POA: Diagnosis not present

## 2016-03-03 DIAGNOSIS — S4991XA Unspecified injury of right shoulder and upper arm, initial encounter: Secondary | ICD-10-CM | POA: Diagnosis present

## 2016-03-03 DIAGNOSIS — J449 Chronic obstructive pulmonary disease, unspecified: Secondary | ICD-10-CM | POA: Diagnosis not present

## 2016-03-03 DIAGNOSIS — Y999 Unspecified external cause status: Secondary | ICD-10-CM | POA: Insufficient documentation

## 2016-03-03 DIAGNOSIS — Z87891 Personal history of nicotine dependence: Secondary | ICD-10-CM | POA: Diagnosis not present

## 2016-03-03 DIAGNOSIS — Y9301 Activity, walking, marching and hiking: Secondary | ICD-10-CM | POA: Insufficient documentation

## 2016-03-03 MED ORDER — OXYCODONE-ACETAMINOPHEN 5-325 MG PO TABS: 1.0000 | ORAL_TABLET | Freq: Once | ORAL | Status: DC

## 2016-03-03 MED ORDER — DIAZEPAM 5 MG PO TABS
2.5000 mg | ORAL_TABLET | Freq: Once | ORAL | Status: AC
  Administered 2016-03-03: 2.5 mg via ORAL
  Filled 2016-03-03: qty 1

## 2016-03-03 MED ORDER — OXYCODONE-ACETAMINOPHEN 5-325 MG PO TABS: 1.0000 | ORAL_TABLET | ORAL | 0 refills | Status: DC | PRN

## 2016-03-03 MED ORDER — MORPHINE SULFATE (PF) 4 MG/ML IV SOLN
4.0000 mg | Freq: Once | INTRAVENOUS | Status: AC
  Administered 2016-03-03: 4 mg via INTRAVENOUS
  Filled 2016-03-03: qty 1

## 2016-03-03 NOTE — ED Notes (Signed)
CMS intact all fingers bilat. 

## 2016-03-03 NOTE — ED Notes (Signed)
We have given p.o. And I.m. Medication and Dr. Patria Maneampos assisted Cheyenne [name] (ortho. Tech.) in the application of a temporary splint.

## 2016-03-03 NOTE — ED Notes (Signed)
Benedict (ortho. Tech.) meets her after x ray. Dr. Patria Maneampos has been notified and will apply splint soon. Pt. Is in no distress and states she feels "better".

## 2016-03-03 NOTE — ED Triage Notes (Signed)
Patient states she fell going up the stairs at home.  Complaining of right shoulder, humerus, and elbow pain.  Denies hitting head or LOC.  Patient is limited movement with right arm.

## 2016-03-03 NOTE — ED Provider Notes (Signed)
[facility]-EMERGENCY DEPT Provider Note   CSN: [id number] Arrival date & time: 03/03/16  1359     History   Chief Complaint Chief Complaint  Patient presents with  . Fall    HPI Cheyenne [name] is a 70 y.o. female.  HPI Patient presents to the emergency department after an acute fall while walking up the stairs.  She fell forward and landed on her right elbow on presents with severe pain and deformity of her right mid humerus.  No other injury.  Denies head injury.  Denies neck pain.  Denies chest pain or abdominal pain.  She's been ambulatory since the fall.  She reports no pain in her ankles, hips, knees.  The patient does not have an orthopedic surgeon.  She reports no numbness or tingling in her fingers.  She has normal grip strength of her right hand.   Past Medical History:  Diagnosis Date  . Alcohol abuse   . Anxiety   . Asthma   . Depression   . DJD (degenerative joint disease)   . Hypothyroidism   . Osteopenia   . Psoriatic arthritis (HCC) 06/02/2009  . Vitamin B12 deficiency     Patient Active Problem List   Diagnosis Date Noted  . COPD exacerbation (HCC) 11/13/2015  . Acute bronchitis 05/22/2015  . Chronic respiratory failure with hypercapnia (HCC) 08/05/2014  . Allergic rhinitis 01/23/2014  . Lung nodule 05/04/2012  . COPD  D  06/02/2009  . HYPOTHYROIDISM 05/07/2009  . VITAMIN B12 DEFICIENCY 05/07/2009  . ANXIETY 05/07/2009  . ALCOHOL ABUSE 05/07/2009  . DEPRESSION 05/07/2009  . ASTHMA 05/07/2009  . DEGENERATIVE JOINT DISEASE 05/07/2009  . OSTEOPENIA 05/07/2009    Past Surgical History:  Procedure Laterality Date  . CATARACT EXTRACTION, BILATERAL    . SPINE SURGERY  05/25/10   lesion removed from t11- Dr. Danielle Dess  . VITRECTOMY     right    OB History    No data available       Home Medications    Prior to Admission medications   Medication Sig Start Date End Date Taking? Authorizing Provider  BREO ELLIPTA 100-25 MCG/INH AEPB INHALE ONE PUFF  INTO THE LUNGS ONCE DAILY 12/10/15  Yes Leslye Peer, MD  buPROPion (WELLBUTRIN XL) 150 MG 24 hr tablet Take 150 mg by mouth daily.   Yes Historical Provider, MD  calcium carbonate (OS-CAL) 600 MG TABS Take 600 mg by mouth daily.     Yes Historical Provider, MD  escitalopram (LEXAPRO) 10 MG tablet Take 10 mg by mouth daily. 02/23/16  Yes Historical Provider, MD  fluticasone (FLONASE) 50 MCG/ACT nasal spray PLACE 2 SPRAYS INTO BOTH NOSTRILS DAILY Patient taking differently: PLACE 2 SPRAYS INTO BOTH NOSTRILS twice daily as needed for allergies 06/18/15  Yes Leslye Peer, MD  glucosamine-chondroitin 500-400 MG tablet Take 1 tablet by mouth 2 (two) times daily.   Yes Historical Provider, MD  HYDROcodone-acetaminophen (NORCO) 7.5-325 MG tablet Take 0.5-1 tablets by mouth 3 (three) times daily as needed for pain. 02/19/16  Yes Historical Provider, MD  levothyroxine (SYNTHROID, LEVOTHROID) 50 MCG tablet Take 50 mcg by mouth daily.     Yes Historical Provider, MD  methylcellulose (ARTIFICIAL TEARS) 1 % ophthalmic solution Place 1 drop into both eyes daily as needed (allergies/dry eyes).    Yes Historical Provider, MD  Omega-3 Fatty Acids (FISH OIL) 1000 MG CAPS Take 1 capsule by mouth every evening.    Yes Historical Provider, MD  OXYGEN 24/7- 2lpm  with sleep and 2-4 with exertion   Yes Historical Provider, MD  PE-GG-APAP & PE-DPH-APAP (MUCINEX SINUS-MAX DAY/NIGHT) Liquid MISC Take 5 mLs by mouth every 12 (twelve) hours as needed (allergies).   Yes Historical Provider, MD  PROAIR HFA 108 (90 Base) MCG/ACT inhaler INHALE UP TO 2 PUFFS EVERY 4 HOURS AS NEEDED Patient taking differently: INHALE UP TO 2 PUFFS EVERY 4 HOURS AS NEEDED for shortness of breath/wheezing 02/23/16  Yes Leslye Peerobert S Byrum, MD  Respiratory Therapy Supplies (FLUTTER) DEVI Use as directed 08/08/14  Yes Nyoka CowdenMichael B Wert, MD  SPIRIVA HANDIHALER 18 MCG inhalation capsule USE ONE (1) CAPSULE INHALATION ONCE DAILY Patient taking differently: USE  ONE (1) CAPSULE INHALATION every evening 12/10/15  Yes Leslye Peerobert S Byrum, MD  levofloxacin (LEVAQUIN) 500 MG tablet Take 1 tablet (500 mg total) by mouth daily. Patient not taking: Reported on 03/03/2016 02/10/16   Leslye Peerobert S Byrum, MD  oxyCODONE-acetaminophen (PERCOCET/ROXICET) 5-325 MG tablet Take 1 tablet by mouth every 4 (four) hours as needed for severe pain. 03/03/16   Azalia BilisKevin Khamani Fairley, MD  predniSONE (DELTASONE) 10 MG tablet Take 4 tablets for 3 days, 3 tablets for 3 days, 2 tablets for 3 days, 1 tablet for 3 days Patient not taking: Reported on 03/03/2016 02/10/16   Leslye Peerobert S Byrum, MD    Family History No family history on file.  Social History Social History  Substance Use Topics  . Smoking status: Former Smoker    Packs/day: 0.50    Years: 40.00    Types: Cigarettes    Quit date: 04/26/2009  . Smokeless tobacco: Never Used  . Alcohol use No     Allergies   Penicillins; Artichoke [cynara scolymus (artichoke)]; and Promethazine hcl   Review of Systems Review of Systems  All other systems reviewed and are negative.    Physical Exam Updated Vital Signs BP (!) 115/43 (BP Location: Left Arm)   Pulse 74   Temp 97.4 F (36.3 C) (Oral)   Resp 20   Ht 5' (1.524 m)   Wt 100 lb (45.4 kg)   SpO2 93%   BMI 19.53 kg/m   Physical Exam  Constitutional: She is oriented to person, place, and time. She appears well-developed and well-nourished. No distress.  HENT:  Head: Normocephalic and atraumatic.  Eyes: EOM are normal.  Neck: Normal range of motion.  Cardiovascular: Normal rate and regular rhythm.   Pulmonary/Chest: Effort normal and breath sounds normal.  Abdominal: Soft. She exhibits no distension. There is no tenderness.  Musculoskeletal:  Normal right radial pulse.  Normal grip strength right hand.  Full range of motion of right elbow.  Obvious right midshaft humerus fracture with instability and deformity consistent with angulation.  Neurological: She is alert and oriented  to person, place, and time.  Skin: Skin is warm and dry.  Psychiatric: She has a normal mood and affect. Judgment normal.  Nursing note and vitals reviewed.    ED Treatments / Results  Labs (all labs ordered are listed, but only abnormal results are displayed) Labs Reviewed - No data to display  EKG  EKG Interpretation None       Radiology Dg Humerus Right  Result Date: 03/03/2016 CLINICAL DATA:  LEs post fall, mid right humerus pain. EXAM: RIGHT HUMERUS - 2+ VIEW COMPARISON:  None in PACs FINDINGS: The patient has sustained an angulated midshaft right humeral fracture. There is significant angulation at the fracture site. The appearance of the right shoulder and right elbow is normal. The  patient is in a posterior splint. IMPRESSION: Acute angulated midshaft fracture of the right humerus. Electronically Signed   By: David  SwazilandJordan M.D.   On: 03/03/2016 15:05    Procedures Procedures (including critical care time)   ++++++++++++++++++++++++++++++++++++++++++  SPLINT APPLICATION Authorized by: Lyanne CoAMPOS,Enrika Aguado M Consent: Verbal consent obtained. Risks and benefits: risks, benefits and alternatives were discussed Consent given by: patient Splint applied by: orthopedic technician Location details:  Right upper extremity Splint type: Right coaptation splint Supplies used: orthoglass Post-procedure: The splinted body part was neurovascularly unchanged following the procedure. Patient tolerance: Patient tolerated the procedure well with no immediate complications.   +++++++++++++++++++++++++++++++++++++++++++++++   Medications Ordered in ED Medications  oxyCODONE-acetaminophen (PERCOCET/ROXICET) 5-325 MG per tablet 1 tablet (not administered)  morphine 4 MG/ML injection 4 mg (4 mg Intravenous Given 03/03/16 1442)  diazepam (VALIUM) tablet 2.5 mg (2.5 mg Oral Given 03/03/16 1434)     Initial Impression / Assessment and Plan / ED Course  I have reviewed the triage vital signs  and the nursing notes.  Pertinent labs & imaging results that were available during my care of the patient were reviewed by me and considered in my medical decision making (see chart for details).  Clinical Course     Patient placed in a temporary posterior splint for some support while imaging could be obtained.  Age-related midshaft right humerus fracture.  Placed in a coaptation splint for comfort.  She feels much better at this time.  Home with pain medication and outpatient orthopedic follow-up.  She'll be referred to our upper extremity surgeon Dr. Mack Hookavid Thompson.   Patient understands return to the ER for new or worsening symptoms.  No other injury.  C-spine nontender.  No closed head injury.  Final Clinical Impressions(s) / ED Diagnoses   Final diagnoses:  Closed comminuted fracture of right humerus, initial encounter    New Prescriptions New Prescriptions   OXYCODONE-ACETAMINOPHEN (PERCOCET/ROXICET) 5-325 MG TABLET    Take 1 tablet by mouth every 4 (four) hours as needed for severe pain.     Azalia BilisKevin Jaeley Wiker, MD 03/03/16 [phone]

## 2016-03-10 DIAGNOSIS — S42331A Displaced oblique fracture of shaft of humerus, right arm, initial encounter for closed fracture: Secondary | ICD-10-CM | POA: Diagnosis not present

## 2016-03-15 DIAGNOSIS — S42331D Displaced oblique fracture of shaft of humerus, right arm, subsequent encounter for fracture with routine healing: Secondary | ICD-10-CM | POA: Diagnosis not present

## 2016-03-15 DIAGNOSIS — M25511 Pain in right shoulder: Secondary | ICD-10-CM | POA: Diagnosis not present

## 2016-03-22 ENCOUNTER — Encounter: Payer: Self-pay | Admitting: Physical Therapy

## 2016-03-22 NOTE — Therapy (Signed)
[facility] [address] [place], [place], [address] Phone: [phone]   Fax:  [phone]  Patient Details  Name: AMERIS AKAMINE MRN: [id number] Date of Birth: 1945-10-09 Referring Provider:  No ref. provider found  Encounter Date: 03/22/2016  PHYSICAL THERAPY DISCHARGE SUMMARY  Visits from Start of Care: 3  Current functional level related to goals / functional outcomes:     PT Long Term Goals - 07/25/15 1022      PT LONG TERM GOAL #1   Title Improve TUG score to </= 13.5 secs to decr. fall risk.  (08-24-15)   Baseline 16.62   Time 4   Period Weeks   Status New     PT LONG TERM GOAL #2   Title Increase Berg balance test score to >/= 51/56 to decr. fall risk.  (08-24-15)   Baseline 49/56 on 07-22-15   Time 4   Period Weeks   Status New     PT LONG TERM GOAL #3   Title Incr. distance in 3" walk test by at least 20' for incr. gait efficiency.  (08-24-15)   Baseline 3" walk test to be completed next session   Time 4   Period Weeks   Status New     PT LONG TERM GOAL #4   Title Independent in HEP for balance exercises.  (08-24-15)   Time 4   Period Weeks   Status New       Remaining deficits: Above goals not tested due to pt attending only 2 treatment sessions following initial evaluation   Education / Equipment: HEP was initiated - pt attended only 2 treatment sessions after eval Plan: Patient agrees to discharge.  Patient goals were not met. Patient is being discharged due to not returning since the last visit.  ?????       Alda Lea, PT 03/22/2016, 11:33 AM  [facility] [address] [place], [place], [address] Phone: [phone]   Fax:  [phone]

## 2016-03-24 DIAGNOSIS — M25511 Pain in right shoulder: Secondary | ICD-10-CM | POA: Diagnosis not present

## 2016-03-24 DIAGNOSIS — S42331D Displaced oblique fracture of shaft of humerus, right arm, subsequent encounter for fracture with routine healing: Secondary | ICD-10-CM | POA: Diagnosis not present

## 2016-03-29 ENCOUNTER — Emergency Department (HOSPITAL_COMMUNITY): Payer: Medicare Other

## 2016-03-29 ENCOUNTER — Inpatient Hospital Stay (HOSPITAL_COMMUNITY)
Admission: EM | Admit: 2016-03-29 | Discharge: 2016-04-12 | DRG: 199 | Disposition: A | Discharge status: Skilled Nursing Facility | Payer: Medicare Other | Attending: Internal Medicine | Admitting: Internal Medicine

## 2016-03-29 ENCOUNTER — Encounter (HOSPITAL_COMMUNITY): Payer: Self-pay | Admitting: Emergency Medicine

## 2016-03-29 DIAGNOSIS — Z9689 Presence of other specified functional implants: Secondary | ICD-10-CM

## 2016-03-29 DIAGNOSIS — Z681 Body mass index (BMI) 19 or less, adult: Secondary | ICD-10-CM

## 2016-03-29 DIAGNOSIS — R000 Tachycardia, unspecified: Secondary | ICD-10-CM

## 2016-03-29 DIAGNOSIS — Z833 Family history of diabetes mellitus: Secondary | ICD-10-CM

## 2016-03-29 DIAGNOSIS — S42301D Unspecified fracture of shaft of humerus, right arm, subsequent encounter for fracture with routine healing: Secondary | ICD-10-CM | POA: Diagnosis not present

## 2016-03-29 DIAGNOSIS — J948 Other specified pleural conditions: Secondary | ICD-10-CM | POA: Diagnosis not present

## 2016-03-29 DIAGNOSIS — E876 Hypokalemia: Secondary | ICD-10-CM | POA: Diagnosis present

## 2016-03-29 DIAGNOSIS — Z4682 Encounter for fitting and adjustment of non-vascular catheter: Secondary | ICD-10-CM | POA: Diagnosis not present

## 2016-03-29 DIAGNOSIS — E440 Moderate protein-calorie malnutrition: Secondary | ICD-10-CM | POA: Diagnosis not present

## 2016-03-29 DIAGNOSIS — M1990 Unspecified osteoarthritis, unspecified site: Secondary | ICD-10-CM | POA: Diagnosis present

## 2016-03-29 DIAGNOSIS — S8991XA Unspecified injury of right lower leg, initial encounter: Secondary | ICD-10-CM | POA: Diagnosis not present

## 2016-03-29 DIAGNOSIS — J9622 Acute and chronic respiratory failure with hypercapnia: Secondary | ICD-10-CM | POA: Diagnosis not present

## 2016-03-29 DIAGNOSIS — R296 Repeated falls: Secondary | ICD-10-CM | POA: Diagnosis present

## 2016-03-29 DIAGNOSIS — S270XXA Traumatic pneumothorax, initial encounter: Secondary | ICD-10-CM | POA: Diagnosis not present

## 2016-03-29 DIAGNOSIS — Z9981 Dependence on supplemental oxygen: Secondary | ICD-10-CM

## 2016-03-29 DIAGNOSIS — Z823 Family history of stroke: Secondary | ICD-10-CM

## 2016-03-29 DIAGNOSIS — Z8249 Family history of ischemic heart disease and other diseases of the circulatory system: Secondary | ICD-10-CM | POA: Diagnosis not present

## 2016-03-29 DIAGNOSIS — J9602 Acute respiratory failure with hypercapnia: Secondary | ICD-10-CM | POA: Diagnosis not present

## 2016-03-29 DIAGNOSIS — M6281 Muscle weakness (generalized): Secondary | ICD-10-CM | POA: Diagnosis not present

## 2016-03-29 DIAGNOSIS — Z9049 Acquired absence of other specified parts of digestive tract: Secondary | ICD-10-CM | POA: Diagnosis not present

## 2016-03-29 DIAGNOSIS — J939 Pneumothorax, unspecified: Secondary | ICD-10-CM | POA: Diagnosis present

## 2016-03-29 DIAGNOSIS — S42391D Other fracture of shaft of right humerus, subsequent encounter for fracture with routine healing: Secondary | ICD-10-CM | POA: Diagnosis not present

## 2016-03-29 DIAGNOSIS — R531 Weakness: Secondary | ICD-10-CM | POA: Diagnosis not present

## 2016-03-29 DIAGNOSIS — J441 Chronic obstructive pulmonary disease with (acute) exacerbation: Secondary | ICD-10-CM | POA: Diagnosis not present

## 2016-03-29 DIAGNOSIS — Z79899 Other long term (current) drug therapy: Secondary | ICD-10-CM

## 2016-03-29 DIAGNOSIS — Z87891 Personal history of nicotine dependence: Secondary | ICD-10-CM | POA: Diagnosis not present

## 2016-03-29 DIAGNOSIS — E43 Unspecified severe protein-calorie malnutrition: Secondary | ICD-10-CM | POA: Diagnosis not present

## 2016-03-29 DIAGNOSIS — G8929 Other chronic pain: Secondary | ICD-10-CM | POA: Diagnosis present

## 2016-03-29 DIAGNOSIS — J9690 Respiratory failure, unspecified, unspecified whether with hypoxia or hypercapnia: Secondary | ICD-10-CM

## 2016-03-29 DIAGNOSIS — J690 Pneumonitis due to inhalation of food and vomit: Secondary | ICD-10-CM | POA: Diagnosis not present

## 2016-03-29 DIAGNOSIS — J9621 Acute and chronic respiratory failure with hypoxia: Secondary | ICD-10-CM | POA: Diagnosis present

## 2016-03-29 DIAGNOSIS — R0602 Shortness of breath: Secondary | ICD-10-CM

## 2016-03-29 DIAGNOSIS — J9311 Primary spontaneous pneumothorax: Secondary | ICD-10-CM | POA: Diagnosis not present

## 2016-03-29 DIAGNOSIS — R1314 Dysphagia, pharyngoesophageal phase: Secondary | ICD-10-CM | POA: Diagnosis not present

## 2016-03-29 DIAGNOSIS — Y95 Nosocomial condition: Secondary | ICD-10-CM | POA: Diagnosis present

## 2016-03-29 DIAGNOSIS — Z66 Do not resuscitate: Secondary | ICD-10-CM | POA: Diagnosis present

## 2016-03-29 DIAGNOSIS — R2681 Unsteadiness on feet: Secondary | ICD-10-CM | POA: Diagnosis not present

## 2016-03-29 DIAGNOSIS — J189 Pneumonia, unspecified organism: Secondary | ICD-10-CM | POA: Clinically undetermined

## 2016-03-29 DIAGNOSIS — Y9301 Activity, walking, marching and hiking: Secondary | ICD-10-CM | POA: Diagnosis present

## 2016-03-29 DIAGNOSIS — Z79891 Long term (current) use of opiate analgesic: Secondary | ICD-10-CM

## 2016-03-29 DIAGNOSIS — J9610 Chronic respiratory failure, unspecified whether with hypoxia or hypercapnia: Secondary | ICD-10-CM | POA: Diagnosis present

## 2016-03-29 DIAGNOSIS — J449 Chronic obstructive pulmonary disease, unspecified: Secondary | ICD-10-CM | POA: Diagnosis not present

## 2016-03-29 DIAGNOSIS — Z7951 Long term (current) use of inhaled steroids: Secondary | ICD-10-CM | POA: Diagnosis not present

## 2016-03-29 DIAGNOSIS — F1010 Alcohol abuse, uncomplicated: Secondary | ICD-10-CM

## 2016-03-29 DIAGNOSIS — E039 Hypothyroidism, unspecified: Secondary | ICD-10-CM | POA: Diagnosis present

## 2016-03-29 DIAGNOSIS — R05 Cough: Secondary | ICD-10-CM | POA: Diagnosis not present

## 2016-03-29 DIAGNOSIS — S299XXA Unspecified injury of thorax, initial encounter: Secondary | ICD-10-CM | POA: Diagnosis not present

## 2016-03-29 DIAGNOSIS — S279XXA Injury of unspecified intrathoracic organ, initial encounter: Secondary | ICD-10-CM | POA: Diagnosis not present

## 2016-03-29 DIAGNOSIS — S42301A Unspecified fracture of shaft of humerus, right arm, initial encounter for closed fracture: Secondary | ICD-10-CM

## 2016-03-29 DIAGNOSIS — F419 Anxiety disorder, unspecified: Secondary | ICD-10-CM | POA: Diagnosis present

## 2016-03-29 DIAGNOSIS — S0990XA Unspecified injury of head, initial encounter: Secondary | ICD-10-CM | POA: Diagnosis not present

## 2016-03-29 DIAGNOSIS — M25511 Pain in right shoulder: Secondary | ICD-10-CM | POA: Diagnosis not present

## 2016-03-29 DIAGNOSIS — J9312 Secondary spontaneous pneumothorax: Secondary | ICD-10-CM | POA: Diagnosis not present

## 2016-03-29 DIAGNOSIS — S270XXD Traumatic pneumothorax, subsequent encounter: Secondary | ICD-10-CM | POA: Diagnosis not present

## 2016-03-29 DIAGNOSIS — S199XXA Unspecified injury of neck, initial encounter: Secondary | ICD-10-CM | POA: Diagnosis not present

## 2016-03-29 DIAGNOSIS — W109XXA Fall (on) (from) unspecified stairs and steps, initial encounter: Secondary | ICD-10-CM | POA: Diagnosis present

## 2016-03-29 DIAGNOSIS — R279 Unspecified lack of coordination: Secondary | ICD-10-CM | POA: Diagnosis not present

## 2016-03-29 DIAGNOSIS — Z978 Presence of other specified devices: Secondary | ICD-10-CM | POA: Diagnosis not present

## 2016-03-29 DIAGNOSIS — J440 Chronic obstructive pulmonary disease with acute lower respiratory infection: Secondary | ICD-10-CM | POA: Diagnosis present

## 2016-03-29 DIAGNOSIS — R41841 Cognitive communication deficit: Secondary | ICD-10-CM | POA: Diagnosis not present

## 2016-03-29 DIAGNOSIS — M545 Low back pain: Secondary | ICD-10-CM | POA: Diagnosis not present

## 2016-03-29 HISTORY — DX: Chronic obstructive pulmonary disease, unspecified: J44.9

## 2016-03-29 LAB — COMPREHENSIVE METABOLIC PANEL
ALBUMIN: 3.9 g/dL (ref 3.5–5.0)
ALT: 21 U/L (ref 14–54)
AST: 34 U/L (ref 15–41)
Alkaline Phosphatase: 117 U/L (ref 38–126)
Anion gap: 9 (ref 5–15)
BUN: 7 mg/dL (ref 6–20)
CHLORIDE: 92 mmol/L — AB (ref 101–111)
CO2: 32 mmol/L (ref 22–32)
Calcium: 8.8 mg/dL — ABNORMAL LOW (ref 8.9–10.3)
Creatinine, Ser: 0.46 mg/dL (ref 0.44–1.00)
GFR calc Af Amer: >60 mL/min (ref >60)
GLUCOSE: 106 mg/dL — AB (ref 65–99)
POTASSIUM: 3.1 mmol/L — AB (ref 3.5–5.1)
SODIUM: 133 mmol/L — AB (ref 135–145)
Total Bilirubin: 0.8 mg/dL (ref 0.3–1.2)
Total Protein: 6.8 g/dL (ref 6.5–8.1)

## 2016-03-29 LAB — CBC WITH DIFFERENTIAL/PLATELET
BASOS ABS: 0 10*3/uL (ref 0.0–0.1)
BASOS PCT: 0 %
EOS PCT: 0 %
Eosinophils Absolute: 0 10*3/uL (ref 0.0–0.7)
HCT: 31.4 % — ABNORMAL LOW (ref 36.0–46.0)
Hemoglobin: 10.3 g/dL — ABNORMAL LOW (ref 12.0–15.0)
Lymphocytes Relative: 9 %
Lymphs Abs: 1.3 10*3/uL (ref 0.7–4.0)
MCH: 30.4 pg (ref 26.0–34.0)
MCHC: 32.8 g/dL (ref 30.0–36.0)
MCV: 92.6 fL (ref 78.0–100.0)
MONO ABS: 1.5 10*3/uL — AB (ref 0.1–1.0)
Monocytes Relative: 10 %
NEUTROS ABS: 11.9 10*3/uL — AB (ref 1.7–7.7)
Neutrophils Relative %: 81 %
PLATELETS: 274 10*3/uL (ref 150–400)
RBC: 3.39 MIL/uL — AB (ref 3.87–5.11)
RDW: 12.4 % (ref 11.5–15.5)
WBC: 14.7 10*3/uL — AB (ref 4.0–10.5)

## 2016-03-29 LAB — BLOOD GAS, ARTERIAL
ACID-BASE EXCESS: 5.2 mmol/L — AB (ref 0.0–2.0)
Bicarbonate: 32.4 mmol/L — ABNORMAL HIGH (ref 20.0–28.0)
DRAWN BY: 422461
O2 CONTENT: 5 L/min
O2 SAT: 95.1 %
PH ART: 7.315 — AB (ref 7.350–7.450)
Patient temperature: 98
pCO2 arterial: 65.2 mmHg (ref 32.0–48.0)
pO2, Arterial: 79.7 mmHg — ABNORMAL LOW (ref 83.0–108.0)

## 2016-03-29 LAB — URINE MICROSCOPIC-ADD ON

## 2016-03-29 LAB — I-STAT TROPONIN, ED
TROPONIN I, POC: 0.01 ng/mL (ref 0.00–0.08)
Troponin i, poc: 0.01 ng/mL (ref 0.00–0.08)

## 2016-03-29 LAB — URINALYSIS, ROUTINE W REFLEX MICROSCOPIC
Bilirubin Urine: NEGATIVE
Glucose, UA: NEGATIVE mg/dL
KETONES UR: 40 mg/dL — AB
LEUKOCYTES UA: NEGATIVE
NITRITE: NEGATIVE
PROTEIN: NEGATIVE mg/dL
Specific Gravity, Urine: 1.008 (ref 1.005–1.030)
pH: 6.5 (ref 5.0–8.0)

## 2016-03-29 MED ORDER — GUAIFENESIN ER 600 MG PO TB12
600.0000 mg | ORAL_TABLET | Freq: Two times a day (BID) | ORAL | Status: DC
  Administered 2016-03-30 – 2016-04-07 (×19): 600 mg via ORAL
  Filled 2016-03-29 (×19): qty 1

## 2016-03-29 MED ORDER — ESCITALOPRAM OXALATE 10 MG PO TABS
10.0000 mg | ORAL_TABLET | Freq: Every day | ORAL | Status: DC
  Administered 2016-03-30 – 2016-04-12 (×13): 10 mg via ORAL
  Filled 2016-03-29 (×13): qty 1

## 2016-03-29 MED ORDER — IPRATROPIUM-ALBUTEROL 0.5-2.5 (3) MG/3ML IN SOLN
3.0000 mL | Freq: Once | RESPIRATORY_TRACT | Status: AC
  Administered 2016-03-29: 3 mL via RESPIRATORY_TRACT
  Filled 2016-03-29: qty 3

## 2016-03-29 MED ORDER — SODIUM CHLORIDE 0.9 % IV BOLUS (SEPSIS)
500.0000 mL | Freq: Once | INTRAVENOUS | Status: AC
  Administered 2016-03-29: 500 mL via INTRAVENOUS

## 2016-03-29 MED ORDER — ONDANSETRON HCL 4 MG/2ML IJ SOLN
4.0000 mg | Freq: Once | INTRAMUSCULAR | Status: AC
  Administered 2016-03-29: 4 mg via INTRAVENOUS
  Filled 2016-03-29: qty 2

## 2016-03-29 MED ORDER — ONDANSETRON HCL 4 MG PO TABS: 4.0000 mg | ORAL_TABLET | Freq: Four times a day (QID) | ORAL | Status: DC | PRN

## 2016-03-29 MED ORDER — IPRATROPIUM-ALBUTEROL 0.5-2.5 (3) MG/3ML IN SOLN
3.0000 mL | Freq: Four times a day (QID) | RESPIRATORY_TRACT | Status: DC
  Administered 2016-03-29 – 2016-04-07 (×33): 3 mL via RESPIRATORY_TRACT
  Filled 2016-03-29 (×33): qty 3

## 2016-03-29 MED ORDER — SODIUM CHLORIDE 0.9 % IV SOLN
INTRAVENOUS | Status: AC
  Administered 2016-03-30: via INTRAVENOUS

## 2016-03-29 MED ORDER — LEVOTHYROXINE SODIUM 50 MCG PO TABS
50.0000 ug | ORAL_TABLET | Freq: Every day | ORAL | Status: DC
  Administered 2016-03-30 – 2016-04-12 (×13): 50 ug via ORAL
  Filled 2016-03-29 (×14): qty 1

## 2016-03-29 MED ORDER — BUPROPION HCL ER (XL) 150 MG PO TB24
150.0000 mg | ORAL_TABLET | Freq: Every day | ORAL | Status: DC
  Administered 2016-03-30 – 2016-04-12 (×13): 150 mg via ORAL
  Filled 2016-03-29 (×13): qty 1

## 2016-03-29 MED ORDER — BUDESONIDE 0.5 MG/2ML IN SUSP
0.5000 mg | Freq: Two times a day (BID) | RESPIRATORY_TRACT | Status: DC
  Administered 2016-03-29 – 2016-04-11 (×27): 0.5 mg via RESPIRATORY_TRACT
  Filled 2016-03-29 (×27): qty 2

## 2016-03-29 MED ORDER — SODIUM CHLORIDE 0.9% FLUSH
3.0000 mL | Freq: Two times a day (BID) | INTRAVENOUS | Status: DC
  Administered 2016-03-30 – 2016-04-12 (×22): 3 mL via INTRAVENOUS

## 2016-03-29 MED ORDER — ONDANSETRON HCL 4 MG/2ML IJ SOLN: 4.0000 mg | Freq: Four times a day (QID) | INTRAMUSCULAR | Status: DC | PRN

## 2016-03-29 MED ORDER — VANCOMYCIN HCL IN DEXTROSE 1-5 GM/200ML-% IV SOLN
1000.0000 mg | INTRAVENOUS | Status: DC
  Administered 2016-03-30 (×2): 1000 mg via INTRAVENOUS
  Filled 2016-03-29 (×2): qty 200

## 2016-03-29 MED ORDER — AZTREONAM 2 G IJ SOLR
2.0000 g | Freq: Three times a day (TID) | INTRAMUSCULAR | Status: DC
  Administered 2016-03-30 – 2016-03-31 (×5): 2 g via INTRAVENOUS
  Filled 2016-03-29 (×6): qty 2

## 2016-03-29 MED ORDER — ACETAMINOPHEN 325 MG PO TABS
650.0000 mg | ORAL_TABLET | Freq: Four times a day (QID) | ORAL | Status: DC | PRN
  Administered 2016-03-30 – 2016-04-11 (×14): 650 mg via ORAL
  Filled 2016-03-29 (×14): qty 2

## 2016-03-29 MED ORDER — IPRATROPIUM-ALBUTEROL 0.5-2.5 (3) MG/3ML IN SOLN
3.0000 mL | RESPIRATORY_TRACT | Status: DC | PRN
  Administered 2016-03-30 – 2016-04-07 (×7): 3 mL via RESPIRATORY_TRACT
  Filled 2016-03-29 (×9): qty 3

## 2016-03-29 MED ORDER — MORPHINE SULFATE (PF) 2 MG/ML IV SOLN
2.0000 mg | Freq: Once | INTRAVENOUS | Status: AC
  Administered 2016-03-29: 2 mg via INTRAVENOUS
  Filled 2016-03-29: qty 1

## 2016-03-29 MED ORDER — METHYLPREDNISOLONE SODIUM SUCC 40 MG IJ SOLR
40.0000 mg | Freq: Two times a day (BID) | INTRAMUSCULAR | Status: DC
  Administered 2016-03-29 – 2016-03-30 (×2): 40 mg via INTRAVENOUS
  Filled 2016-03-29 (×2): qty 1

## 2016-03-29 MED ORDER — LIDOCAINE HCL (PF) 1 % IJ SOLN
INTRAMUSCULAR | Status: AC
  Administered 2016-03-29: 30 mL
  Filled 2016-03-29: qty 30

## 2016-03-29 MED ORDER — NALOXONE HCL 0.4 MG/ML IJ SOLN
0.4000 mg | Freq: Once | INTRAMUSCULAR | Status: AC
  Administered 2016-03-29: 0.4 mg via INTRAVENOUS
  Filled 2016-03-29: qty 1

## 2016-03-29 MED ORDER — HYDROCODONE-ACETAMINOPHEN 7.5-325 MG PO TABS
1.0000 | ORAL_TABLET | Freq: Four times a day (QID) | ORAL | Status: DC | PRN
  Administered 2016-03-30 – 2016-04-06 (×27): 1 via ORAL
  Filled 2016-03-29 (×27): qty 1

## 2016-03-29 MED ORDER — ACETAMINOPHEN 650 MG RE SUPP: 650.0000 mg | Freq: Four times a day (QID) | RECTAL | Status: DC | PRN

## 2016-03-29 MED ORDER — SODIUM CHLORIDE 0.9 % IV SOLN
Freq: Once | INTRAVENOUS | Status: AC
  Administered 2016-03-29: 14:00:00 via INTRAVENOUS

## 2016-03-29 NOTE — ED Notes (Signed)
RN is starting IV at this time and obtaining bloodwork

## 2016-03-29 NOTE — H&P (Signed)
Cheyenne [name] UJW:[id number] DOB: Jan 31, 1946 DOA: 03/29/2016     PCP: [name], MD   Outpatient Specialists: Pulmonology [facility]  Patient coming from:    home Lives With family    Chief Complaint: fall, shortness of breath  HPI: Cheyenne [name] is a 70 y.o. female with medical history significant of COPD  chronic hypoxemic respiratory failure on home oxygen 3 L, former smoker, former EtOH abuse, hx hypothroidism, DJD, anxiety, recurrent falls    Presented with worsening cough and shortness of breath after frequent falls 2 days ago patient fell down and hit chest on the banister prior to this on the eighth patient also fell and fractured her right arm last night she fell out of the bed and injured her right knee. Since her falls patient had been having left chest pain she called EMS was given nebulizer known drug on arrival. On November 8 patient had a fall and broke right humerus she was supposed to follow up with orthopedics and currently doing PT sessions. Examination started to ask family for help to walk since she is human at her legs and not supporting her. Patient not on any anticoagulation she denies hitting her head no LOS she's been having increased shortness of breath and cough she hadn't had any fever Reports gradual onset of fatigue Patient reports no alcohol use for the past 20 years  Patient have had frequent falls due to instability she wears oxygen and very frail Regarding pertinent Chronic problems: Examination is known severe COPD followed by pulmonology last flareup was diagnosed in mid October IN ER:  Temp (24hrs), Avg:98 F (36.7 C), Min:98 F (36.7 C), Max:98 F (36.7 C)   O2 sats 94-96% on 2L [place] Her troponin was unremarkable potassium down to 3.3 CT head unremarkable Chest x-ray showed left basilar moderately side pneumothorax and chronic emphysema  ER provider discussed case with:  Pulmonology who states patient most likely has COPD  exacerbation possibly complicated by HCAP pneumonia worsened from the pneumothorax in the left recommended CT surgery consult CT surgery who consulted ad  placed chest tube at [facility] ER    Recommend admission to [facility]  Patient now feels better Oxygen requirement went down to 6 L  Sodium thereafter she became somnolent ABG was obtained showing pH 7.315 PCO2 of 67 PO2 of 79  PCCM has been made aware Recommended Narcan and check CT tube for leak. no leak detected  patient received 0.2 of Narcan with some improvement.  Continue to monitor and step down appreciate pulmonology assistance   Following Medications were ordered in ER: Medications  budesonide (PULMICORT) nebulizer solution 0.5 mg (0.5 mg Nebulization Given 03/29/16 1947)  ipratropium-albuterol (DUONEB) 0.5-2.5 (3) MG/3ML nebulizer solution 3 mL (3 mLs Nebulization Given 03/29/16 1947)  ipratropium-albuterol (DUONEB) 0.5-2.5 (3) MG/3ML nebulizer solution 3 mL (not administered)  methylPREDNISolone sodium succinate (SOLU-MEDROL) 40 mg/mL injection 40 mg (40 mg Intravenous Given 03/29/16 1805)  morphine 2 MG/ML injection 2 mg (2 mg Intravenous Given 03/29/16 1213)  ondansetron (ZOFRAN) injection 4 mg (4 mg Intravenous Given 03/29/16 1213)  sodium chloride 0.9 % bolus 500 mL (0 mLs Intravenous Stopped 03/29/16 1345)  0.9 %  sodium chloride infusion ( Intravenous New Bag/Given 03/29/16 1345)  morphine 2 MG/ML injection 2 mg (2 mg Intravenous Given 03/29/16 1544)  ondansetron (ZOFRAN) injection 4 mg (4 mg Intravenous Given 03/29/16 1544)  ipratropium-albuterol (DUONEB) 0.5-2.5 (3) MG/3ML nebulizer solution 3 mL (3 mLs Nebulization Given 03/29/16 1626)  lidocaine (PF) (  XYLOCAINE) 1 % injection (30 mLs  Given 03/29/16 1920)  morphine 2 MG/ML injection 2 mg (2 mg Intravenous Given 03/29/16 1918)  naloxone [facility](NARCAN) injection 0.4 mg (0.4 mg Intravenous Given 03/29/16 2116)     Hospitalist was called for admission for pneumothorx  Review of Systems:     Pertinent positives include: fall, shortness of breath at rest, dyspnea on exertion,  Constitutional:  No weight loss, night sweats, Fevers, chills, fatigue, weight loss  HEENT:  No headaches, Difficulty swallowing,Tooth/dental problems,Sore throat,  No sneezing, itching, ear ache, nasal congestion, post nasal drip,  Cardio-vascular:  No chest pain, Orthopnea, PND, anasarca, dizziness, palpitations.no Bilateral lower extremity swelling  GI:  No heartburn, indigestion, abdominal pain, nausea, vomiting, diarrhea, change in bowel habits, loss of appetite, melena, blood in stool, hematemesis Resp:    No excess mucus, no productive cough, No non-productive cough, No coughing up of blood.  No change in color of mucus.No wheezing. Skin:  no rash or lesions. No jaundice GU:  no dysuria, change in color of urine, no urgency or frequency. No straining to urinate.  No flank pain.  Musculoskeletal:  No joint pain or no joint swelling. No decreased range of motion. No back pain.  Psych:  No change in mood or affect. No depression or anxiety. No memory loss.  Neuro: no localizing neurological complaints, no tingling, no weakness, no double vision, no gait abnormality, no slurred speech, no confusion  As per HPI otherwise 10 point review of systems negative.   Past Medical History: Past Medical History:  Diagnosis Date  . Alcohol abuse   . Anxiety   . Asthma   . COPD (chronic obstructive pulmonary disease) (HCC)   . Depression   . DJD (degenerative joint disease)   . Hypothyroidism   . Osteopenia   . Psoriatic arthritis (HCC) 06/02/2009  . Vitamin B12 deficiency    Past Surgical History:  Procedure Laterality Date  . CATARACT EXTRACTION, BILATERAL    . SPINE SURGERY  05/25/10   lesion removed from t11- Dr. Danielle DessElsner  . VITRECTOMY     right     Social History:  Ambulatory  walker       reports that she quit smoking about 6 years ago. Her smoking use included Cigarettes. She has  a 20.00 pack-year smoking history. She has never used smokeless tobacco. She reports that she does not drink alcohol or use drugs.  Allergies:   Allergies  Allergen Reactions  . Penicillins Anaphylaxis    Has patient had a PCN reaction causing immediate rash, facial/tongue/throat swelling, SOB or lightheadedness with hypotension: yes Has patient had a PCN reaction causing severe rash involving mucus membranes or skin necrosis: unknown Has patient had a PCN reaction that required hospitalization: unknown Has patient had a PCN reaction occurring within the last 10 years: no If all of the above answers are "NO", then may proceed with Cephalosporin use.   . Promethazine Hcl Diarrhea and Nausea And Vomiting       Family History:   Family History  Problem Relation Age of Onset  . Other Father   . CAD Father   . Diabetes Other   . Stroke Other     Medications: Prior to Admission medications   Medication Sig Start Date End Date Taking? Authorizing Provider  BREO ELLIPTA 100-25 MCG/INH AEPB INHALE ONE PUFF INTO THE LUNGS ONCE DAILY 12/10/15  Yes Leslye Peerobert S [facility], MD  buPROPion (WELLBUTRIN XL) 150 MG 24 hr tablet Take 150 mg  by mouth daily.   Yes Historical Provider, MD  calcium carbonate (OS-CAL) 600 MG TABS Take 600 mg by mouth daily.     Yes Historical Provider, MD  escitalopram (LEXAPRO) 10 MG tablet Take 10 mg by mouth daily. 02/23/16  Yes Historical Provider, MD  fluticasone (FLONASE) 50 MCG/ACT nasal spray PLACE 2 SPRAYS INTO BOTH NOSTRILS DAILY Patient taking differently: PLACE 2 SPRAYS INTO BOTH NOSTRILS twice daily as needed for allergies 06/18/15  Yes Leslye Peer, MD  glucosamine-chondroitin 500-400 MG tablet Take 1 tablet by mouth 2 (two) times daily.   Yes Historical Provider, MD  HYDROcodone-acetaminophen (NORCO) 7.5-325 MG tablet Take 0.5-1 tablets by mouth 3 (three) times daily as needed for pain. 02/19/16  Yes Historical Provider, MD  levothyroxine (SYNTHROID, LEVOTHROID)  50 MCG tablet Take 50 mcg by mouth daily.     Yes Historical Provider, MD  methylcellulose (ARTIFICIAL TEARS) 1 % ophthalmic solution Place 1 drop into both eyes daily as needed (allergies/dry eyes).    Yes Historical Provider, MD  Omega-3 Fatty Acids (FISH OIL) 1000 MG CAPS Take 1 capsule by mouth every evening.    Yes Historical Provider, MD  oxyCODONE (OXY IR/ROXICODONE) 5 MG immediate release tablet Take 5-10 mg by mouth every 4 (four) hours as needed for pain. 03/10/16  Yes Historical Provider, MD  OXYGEN 24/7- 2lpm with sleep and 2-4 with exertion   Yes Historical Provider, MD  PE-GG-APAP & PE-DPH-APAP (MUCINEX SINUS-MAX DAY/NIGHT) Liquid MISC Take 5 mLs by mouth every 12 (twelve) hours as needed (allergies).   Yes Historical Provider, MD  PROAIR HFA 108 (90 Base) MCG/ACT inhaler INHALE UP TO 2 PUFFS EVERY 4 HOURS AS NEEDED Patient taking differently: INHALE UP TO 2 PUFFS EVERY 4 HOURS AS NEEDED for shortness of breath/wheezing 02/23/16  Yes Leslye Peer, MD  SPIRIVA HANDIHALER 18 MCG inhalation capsule USE ONE (1) CAPSULE INHALATION ONCE DAILY Patient taking differently: USE ONE (1) CAPSULE INHALATION every evening 12/10/15  Yes Leslye Peer, MD  levofloxacin (LEVAQUIN) 500 MG tablet Take 1 tablet (500 mg total) by mouth daily. Patient not taking: Reported on 03/03/2016 02/10/16   Leslye Peer, MD  oxyCODONE-acetaminophen (PERCOCET/ROXICET) 5-325 MG tablet Take 1 tablet by mouth every 4 (four) hours as needed for severe pain. Patient not taking: Reported on 03/29/2016 03/03/16   Azalia Bilis, MD  predniSONE (DELTASONE) 10 MG tablet Take 4 tablets for 3 days, 3 tablets for 3 days, 2 tablets for 3 days, 1 tablet for 3 days Patient not taking: Reported on 03/03/2016 02/10/16   Leslye Peer, MD  Respiratory Therapy Supplies (FLUTTER) DEVI Use as directed 08/08/14   Nyoka Cowden, MD    Physical Exam: Patient Vitals for the past 24 hrs:  BP Temp Temp src Pulse Resp SpO2  03/29/16 1830  140/65 - - 101 21 100 %  03/29/16 1806 125/83 - - 92 18 100 %  03/29/16 1730 139/67 - - 93 19 100 %  03/29/16 1721 141/68 - - 98 17 98 %  03/29/16 1630 154/82 - - 101 18 100 %  03/29/16 1600 137/77 - - 93 19 100 %  03/29/16 1530 164/76 - - 99 15 100 %  03/29/16 1447 149/73 - - 96 22 96 %  03/29/16 1227 157/95 - - 98 19 94 %  03/29/16 1014 148/83 98 F (36.7 C) Oral 98 15 100 %    1. General:  in No Acute distress 2. Psychological: Alert and  Oriented 3. Head/ENT:    Dry Mucous Membranes                          Head Non traumatic, neck supple                           Poor Dentition 4. SKIN:  decreased Skin turgor,  Skin clean Dry and intact no rash 5. Heart: Regular rate and rhythm no  Murmur, Rub or gallop 6. Lungs: some wheezes and crackles, coarse breath sounds Chest  Tube in place.  7. Abdomen: Soft,  non-tender, Non distended 8. Lower extremities: no clubbing, cyanosis, or edema 9. Neurologically strength 5 out of 5 in all 4 extremities cranial nerves II through XII intact 10. MSK: Normal range of motion   body mass index is unknown because there is no height or weight on file.  Labs on Admission:   Labs on Admission: I have personally reviewed following labs and imaging studies  CBC:  Recent Labs Lab 03/29/16 1211  WBC 14.7*  NEUTROABS 11.9*  HGB 10.3*  HCT 31.4*  MCV 92.6  PLT 274   Basic Metabolic Panel:  Recent Labs Lab 03/29/16 1211  NA 133*  K 3.1*  CL 92*  CO2 32  GLUCOSE 106*  BUN 7  CREATININE 0.46  CALCIUM 8.8*   GFR: CrCl cannot be calculated (Unknown ideal weight.). Liver Function Tests:  Recent Labs Lab 03/29/16 1211  AST 34  ALT 21  ALKPHOS 117  BILITOT 0.8  PROT 6.8  ALBUMIN 3.9   No results for input(s): LIPASE, AMYLASE in the last 168 hours. No results for input(s): AMMONIA in the last 168 hours. Coagulation Profile: No results for input(s): INR, PROTIME in the last 168 hours. Cardiac Enzymes: No results for  input(s): CKTOTAL, CKMB, CKMBINDEX, TROPONINI in the last 168 hours. BNP (last 3 results) No results for input(s): PROBNP in the last 8760 hours. HbA1C: No results for input(s): HGBA1C in the last 72 hours. CBG: No results for input(s): GLUCAP in the last 168 hours. Lipid Profile: No results for input(s): CHOL, HDL, LDLCALC, TRIG, CHOLHDL, LDLDIRECT in the last 72 hours. Thyroid Function Tests: No results for input(s): TSH, T4TOTAL, FREET4, T3FREE, THYROIDAB in the last 72 hours. Anemia Panel: No results for input(s): VITAMINB12, FOLATE, FERRITIN, TIBC, IRON, RETICCTPCT in the last 72 hours. Urine analysis:    Component Value Date/Time   COLORURINE YELLOW 03/29/2016 1211   APPEARANCEUR CLEAR 03/29/2016 1211   LABSPEC 1.008 03/29/2016 1211   PHURINE 6.5 03/29/2016 1211   GLUCOSEU NEGATIVE 03/29/2016 1211   HGBUR SMALL (A) 03/29/2016 1211   BILIRUBINUR NEGATIVE 03/29/2016 1211   KETONESUR 40 (A) 03/29/2016 1211   PROTEINUR NEGATIVE 03/29/2016 1211   UROBILINOGEN 0.2 12/12/2006 1153   NITRITE NEGATIVE 03/29/2016 1211   LEUKOCYTESUR NEGATIVE 03/29/2016 1211   Sepsis Labs: @LABRCNTIP (procalcitonin:4,lacticidven:4) )No results found for this or any previous visit (from the past 240 hour(s)).     UA   no evidence of UTI   No results found for: HGBA1C  CrCl cannot be calculated (Unknown ideal weight.).  BNP (last 3 results) No results for input(s): PROBNP in the last 8760 hours.   ECG REPORT ordered  There were no vitals filed for this visit.   Cultures: No results found for: SDES, SPECREQUEST, CULT, REPTSTATUS   Radiological Exams on Admission: Dg Chest 2 View  Result Date: 03/29/2016 CLINICAL DATA:  Fall. Recent humerus fracture. Cough. Congestion. COPD/asthma. EXAM: CHEST  2 VIEW COMPARISON:  02/10/2016 FINDINGS: Moderately degraded lateral view, secondary to patient positioning, including arms not raised above the head. Mid thoracic compression deformity is moderate  and similar. Midline trachea. Normal heart size. Atherosclerosis in the transverse aorta. No pleural fluid. Moderate left-sided pneumothorax, primarily identified inferiorly and laterally. On the order of 40%. Underlying COPD/chronic bronchitis, as evidenced by interstitial thickening and hyperinflation. IMPRESSION: Moderate size left-sided pneumothorax. Aortic atherosclerosis. Critical test results telephoned to dr. Adela Lank. at the time of interpretation at 11:56 a.m.on 03/29/2016. Electronically Signed   By: Jeronimo Greaves M.D.   On: 03/29/2016 11:56   Ct Head Wo Contrast  Result Date: 03/29/2016 CLINICAL DATA:  Recent falls.  Right arm fracture. EXAM: CT HEAD WITHOUT CONTRAST CT CERVICAL SPINE WITHOUT CONTRAST TECHNIQUE: Multidetector CT imaging of the head and cervical spine was performed following the standard protocol without intravenous contrast. Multiplanar CT image reconstructions of the cervical spine were also generated. COMPARISON:  Cervical spine myelogram 05/07/2010 FINDINGS: CT HEAD FINDINGS Brain: Mild generalized atrophy and white matter disease is somewhat advanced for age no acute infarct, hemorrhage, or mass lesion is present. The ventricles are proportionate to the degree of atrophy. No significant extra-axial fluid collection is present. Vascular: Atherosclerotic calcifications are present within the cavernous internal carotid arteries bilaterally. There is no hyperdense vessel. Skull: The calvarium is intact. No acute fractures are present. No focal lytic or blastic lesions are present. No significant extracranial soft tissue injury is present. Sinuses/Orbits: The paranasal sinuses and mastoid air cells are clear. Bilateral lens replacements are present. The globes and orbits are otherwise intact. CT CERVICAL SPINE FINDINGS Alignment: Slight retrolisthesis at C3-4 at C4-5 is stable. AP alignment is otherwise anatomic. Skull base and vertebrae: The craniocervical junction is within normal  limits. Soft tissues and spinal canal: Dense atherosclerotic calcifications are present at the carotid bifurcations bilaterally with possible stenosis on the left. The soft tissues the neck are otherwise unremarkable. The spinal canal is grossly patent. Disc levels: Multilevel stenosis of the cervical spine is again noted. The most severe level is at C4-5 with moderate central and bilateral foraminal stenosis. Upper chest: A prominent left pneumothorax is noted. This was called earlier today. Severe centrilobular emphysema is noted. IMPRESSION: 1. Persistent left-sided pneumothorax. 2. Emphysema. 3. Multilevel spondylosis of the cervical spine without acute fracture or traumatic subluxation. 4. Moderate atrophy and white matter disease likely reflects the sequela of chronic microvascular ischemia. 5. No acute intracranial abnormality. These results will be called to the ordering clinician or representative by the Radiologist Assistant, and communication documented in the PACS or zVision Dashboard. Electronically Signed   By: Marin Roberts M.D.   On: 03/29/2016 14:23   Ct Chest Wo Contrast  Result Date: 03/29/2016 CLINICAL DATA:  Chest tightness and worsening cough in patient with history of COPD. Pneumothorax by plain films earlier today. EXAM: CT CHEST WITHOUT CONTRAST TECHNIQUE: Multidetector CT imaging of the chest was performed following the standard protocol without IV contrast. COMPARISON:  PA and lateral chest this same day and 07/31/2015. CT chest 11/20/2012. FINDINGS: Cardiovascular: Calcific aortic and coronary atherosclerosis is identified. No pericardial effusion. Heart size is normal. Mediastinum/Nodes: No enlarged mediastinal or axillary lymph nodes. Thyroid gland, trachea and esophagus demonstrate no significant findings. Lungs/Pleura: The patient has a known left hydropneumothorax with only a small amount of pleural fluid. Pneumothorax estimated at 70%. The lungs demonstrate extensive  emphysematous disease. There is some atelectasis  bilaterally, worse on the left. Upper Abdomen: The patient is status post cholecystectomy. No acute abnormality is identified. Musculoskeletal: T7 and T8 compression fracture appears are identified as seen 07/31/2015 exam. No worrisome lesion is identified. IMPRESSION: Left hydropneumothorax. There is only a small amount of pleural fluid. Pneumothorax is estimated at 70%. Severe emphysema. Atherosclerosis. Critical Value/emergent results were called by telephone at the time of interpretation on 03/29/2016 at 5:25 pm to [facility] who verbally acknowledged these results. Electronically Signed   By: Drusilla Kanner M.D.   On: 03/29/2016 17:27   Ct Cervical Spine Wo Contrast  Result Date: 03/29/2016 CLINICAL DATA:  Recent falls.  Right arm fracture. EXAM: CT HEAD WITHOUT CONTRAST CT CERVICAL SPINE WITHOUT CONTRAST TECHNIQUE: Multidetector CT imaging of the head and cervical spine was performed following the standard protocol without intravenous contrast. Multiplanar CT image reconstructions of the cervical spine were also generated. COMPARISON:  Cervical spine myelogram 05/07/2010 FINDINGS: CT HEAD FINDINGS Brain: Mild generalized atrophy and white matter disease is somewhat advanced for age no acute infarct, hemorrhage, or mass lesion is present. The ventricles are proportionate to the degree of atrophy. No significant extra-axial fluid collection is present. Vascular: Atherosclerotic calcifications are present within the cavernous internal carotid arteries bilaterally. There is no hyperdense vessel. Skull: The calvarium is intact. No acute fractures are present. No focal lytic or blastic lesions are present. No significant extracranial soft tissue injury is present. Sinuses/Orbits: The paranasal sinuses and mastoid air cells are clear. Bilateral lens replacements are present. The globes and orbits are otherwise intact. CT CERVICAL SPINE FINDINGS Alignment:  Slight retrolisthesis at C3-4 at C4-5 is stable. AP alignment is otherwise anatomic. Skull base and vertebrae: The craniocervical junction is within normal limits. Soft tissues and spinal canal: Dense atherosclerotic calcifications are present at the carotid bifurcations bilaterally with possible stenosis on the left. The soft tissues the neck are otherwise unremarkable. The spinal canal is grossly patent. Disc levels: Multilevel stenosis of the cervical spine is again noted. The most severe level is at C4-5 with moderate central and bilateral foraminal stenosis. Upper chest: A prominent left pneumothorax is noted. This was called earlier today. Severe centrilobular emphysema is noted. IMPRESSION: 1. Persistent left-sided pneumothorax. 2. Emphysema. 3. Multilevel spondylosis of the cervical spine without acute fracture or traumatic subluxation. 4. Moderate atrophy and white matter disease likely reflects the sequela of chronic microvascular ischemia. 5. No acute intracranial abnormality. These results will be called to the ordering clinician or representative by the Radiologist Assistant, and communication documented in the PACS or zVision Dashboard. Electronically Signed   By: Marin Roberts M.D.   On: 03/29/2016 14:23   Dg Knee Complete 4 Views Right  Result Date: 03/29/2016 CLINICAL DATA:  Fall EXAM: RIGHT KNEE - COMPLETE 4+ VIEW COMPARISON:  None. FINDINGS: Four views of the right knee submitted. No acute fracture or subluxation. There is mild narrowing of medial joint compartment. Chondrocalcinosis is noted. Mild narrowing of patellofemoral joint space. IMPRESSION: No acute fracture or subluxation. Chondrocalcinosis. Osteoarthritic changes. Electronically Signed   By: Natasha Mead M.D.   On: 03/29/2016 17:26    Chart has been reviewed    Assessment/Plan   70 y.o. female with medical history significant of COPD  chronic hypoxemic respiratory failure on home oxygen 3 L, former smoker, former EtOH  abuse, hx hypothroidism, DJD, anxiety, recurrent falls been admitted for COPD exacerbation and pneumothorax after fall     Present on Admission:  . Respiratory failure with hypercapnia (  HCC) - mental status improved with small dose of Narcan will continue to monitor closely chest tube in place no evidence of leak. If patient continues to become somnolent despite Narcan would be a candidate for BiPAP patient is DNR/DNI . Hypokalemia will replace and check magnesium level . COPD exacerbation (HCC)  -  - Will initiate: Steroid taper  -  Antibiotics coverage of possible H camp - Albuterol  PRN, - scheduled duoneb,  -  Breo or Dulera at discharge   -  Mucinex.  Titrate O2 to saturation >90%. Follow patients respiratory status. -   PCCM consulted for e-link monitoring,  -    Currently mentating well no evidence of symptomatic hypercarbia  . Pneumothorax appreciated CT surgery and pulmonology input admit to step down chest tube repeat imaging showing improvement of pneumothorax   Other plan as per orders.  DVT prophylaxis:  SCD       Code Status:   DNR/DNI  as per patient    Family Communication:   Family not  at  Bedside    Disposition Plan:     likely will need placement for rehabilitation                                                 Would benefit from PT/OT eval prior to DC  ordered                   Nutrition    consulted                          Consults called: PCCM, CT surgery  Admission status:  inpatient       Level of care     SDU      I have spent a total of 86 min on this admission patient's care been extensively discussed with PCCM    Mousa Prout 03/29/2016, 9:30 PM    [facility]  Pager [phone]   after 2 AM please page floor coverage PA If 7AM-7PM, please contact the day team taking care of the patient  [email or web address]  Password [email or web address]

## 2016-03-29 NOTE — ED Provider Notes (Signed)
4:00PM: Care assumed from Vaillaudia Gibbons, [place].   Cheyenne MassedRevauda S [name] is a 70 y.o. female with hx of ETOH, COPD GOLD stage 4 2L O2 at home and 4L with exertion, chronic back pain, hypothyroidism presents to ED with multiple complaints. Pt recently fell 2 days ago down stairs, striking her back against the step. Complains of left sided chest wall pain since. Has associated increased cough, SOB, and inability to cough up sputum. Pt given IVF, morphine/zofran, and duoneb. CXR remarkable for L basilar moderately sized PTX with chronic emphysema. CT surgery initially consulted, [facility] to [facility] Dr. Delphina CahillJose Angelo A de Dios unable to place chest tube here, recommended CT chest w/o contrast for further evaluation and need for chest tube given advanced COPD. At sign out, CT pending, dispo pending CT results - [facility] chest placement by IR.   5:27 PM: Spoke with Dr. Gerome Apleyelisso of radiolgy, left hydropneumothorax, estimated 70% with small pleural effusion, with extensive emphysema.    6:08 PM: Spoke with Dr. Windy Cannyunghel of [facility], greatly appreciate his time and input. Recommend ED to ED transfer Cardio-thoracic surgery eval in ED for chest tube placement and admission there. Will consult to thoracic surgery.   6:31 PM: Spoke with Dr. Tyrone SageGerhardt of Cardiothoracic Surgery, greatly appreciate his time. [facility] chest tube. Pt [facility] be [facility] and [facility] Will consult to hospitalist for admission following chest tube placement.   7:53 PM: Spoke with Dr. Adela [facility]limpseoutova [facility], greatly appreciate her time and input. Graciously agrees to admit patient for further evaluation and management of co-morbid conditions and PTX.      Cheyenne Kettleshley Laurel Oron Westrup, PA-C 03/29/16 1953    Cheyenne FossaElizabeth Rees, MD 03/30/16 [phone]

## 2016-03-29 NOTE — ED Notes (Signed)
Attempt IV to Left AC unsuccessful. Primary RN notified.

## 2016-03-29 NOTE — Progress Notes (Signed)
CSW made an attempt to speak with patient. Pharmacy was in with patient at the time.  Cheyenne [name], LCSWA Clincial Social Worker [phone] 11:56 AM

## 2016-03-29 NOTE — ED Notes (Signed)
MD at bedside. 

## 2016-03-29 NOTE — ED Notes (Signed)
ED Provider at bedside. 

## 2016-03-29 NOTE — ED Triage Notes (Signed)
Per EMS-states she fell down stairs on Saturday and hit chest on banister stand-fell on the 8 th fracturing right arm-fell out of bed last night injuring right knee-complaining of left chest pain with palpation-given a neb treatment in route 5 mg Albuterol-patients lungs congested-on 3 L [place] at home

## 2016-03-29 NOTE — Progress Notes (Signed)
Pharmacy Antibiotic Note  Cheyenne [name] is a 70 y.o. female with hx of COPD, chronic hypoxemic respiratory failure on home O2 now with SOB admitted on 03/29/2016 with pneumonia.  Pharmacy has been consulted for vancomycin dosing.  Plan: Azactam 2Gm IV q8h Vancomycin 1Gm IV q24h VT=15-20 mg/L  Height: 4' 11.84" (152 cm) Weight: 100 lb 1.4 oz (45.4 kg) IBW/kg (Calculated) : 45.14  Temp (24hrs), Avg:98.1 F (36.7 C), Min:98 F (36.7 C), Max:98.2 F (36.8 C)   Recent Labs Lab 03/29/16 1211  WBC 14.7*  CREATININE 0.46    Estimated Creatinine Clearance: 47.3 mL/min (by C-G formula based on SCr of 0.46 mg/dL).    Allergies  Allergen Reactions  . Penicillins Anaphylaxis    Has patient had a PCN reaction causing immediate rash, facial/tongue/throat swelling, SOB or lightheadedness with hypotension: yes Has patient had a PCN reaction causing severe rash involving mucus membranes or skin necrosis: unknown Has patient had a PCN reaction that required hospitalization: unknown Has patient had a PCN reaction occurring within the last 10 years: no If all of the above answers are "NO", then may proceed with Cephalosporin use.   . Promethazine Hcl Diarrhea and Nausea And Vomiting    Antimicrobials this admission: 12/4 azactam >>  12/4 vancomycin >>   Dose adjustments this admission:   Microbiology results:  BCx:   UCx:    Sputum:    MRSA PCR:   Thank you for allowing pharmacy to be a part of this patient's care.  Lorenza EvangelistGreen, Ingri Diemer R 03/29/2016 11:31 PM

## 2016-03-29 NOTE — ED Notes (Signed)
Phlebotomy at bedside for second attempt at blood stick.

## 2016-03-29 NOTE — Progress Notes (Addendum)
CSW spoke with patient at bedside with husband, Lyanne Cod Feinstein present. Patient reports around five weeks ago, he was heading upstairs to go to the bathroom and she slipped and broke her arm. Patient reports she has been getting "sicker and in more pain". Patient reports "it's a stumbling thing". Patient reports for the last two nights she has slept on the couch. Patient reports she was completing her own ADL's prior to breaking her arm. Patient reports she has someone coming in from home health to assist. Patient reports she has a rolling walker. Patient's husband inquired about possible placement once decisions are made by the doctor. Husband was offered resources for SNF, however, none were given. No other questions noted at this time.   Posey ReaLaVonia Leith Szafranski, LCSWA Clinical Social Worker [phone] 2:22 PM

## 2016-03-29 NOTE — Consult Note (Signed)
PULMONARY / CRITICAL CARE MEDICINE   Name: Cheyenne [name] MRN: [id number] DOB: May 31, 1945    ADMISSION DATE:  03/29/2016 CONSULTATION DATE:  03/29/16  REFERRING MD:  Dr. Adela Lank (EDP)  CHIEF COMPLAINT:  PTX related to fall.   HISTORY OF PRESENT ILLNESS:   Patient is a 70 year old female, being seen by Dr, [name] for severe COPD, on 3L O2 24/7,  former smoker, history of ETOH abuse, chronic pain, comes in with worsening SOB.  Patient was last seen by Dr. [name] at the office in October 2017. She was noted to be in mild flare for COPD and was given prednisone taper over one week and levofloxacin for 7 days. She improved and went back to baseline. During that office visit note, she mentioned about having a fall.  Patient had a fall on November 8 and went to the ER. She had a fall while on walking up the stairs. She fell forward and landed on her right elbow. She was diagnosed with right humeral fracture for which she had a cast placed. She has been doing rehabilitation as far as her humeral fracture is concerned.   Patient noticed worsening dyspnea in the last couple of weeks. It sounded like her COPD was starting to flare up again. The dyspnea was worse the last week. Started with cough, productive, maybe low-grade fever. On Saturday, she fell down again from the stairs, hitting her chest directly on the edge of a step. She started hurting on the left side of her chest. She did not go to the emergency room as she wanted to wait it out.  Last night, she tried to get out of bed to use the bathroom but was unable to get on her feet and lost her balance. She fell again on her bottom and she called her husband. Still did not go to ED.   Because of her worsening dyspnea and cough, she decided to go to the emergency room. At the emergency room, chest x-ray showed moderate size pneumothorax. ED physician consulted [facility] and PCCM.   She has since been placed on 100% nonrebreather mask. Her breathing is a  little better compared to when she came in.  PAST MEDICAL HISTORY :  She  has a past medical history of Alcohol abuse; Anxiety; Asthma; COPD (chronic obstructive pulmonary disease) (HCC); Depression; DJD (degenerative joint disease); Hypothyroidism; Osteopenia; Psoriatic arthritis (HCC) (06/02/2009); and Vitamin B12 deficiency.  Chronic pain > has been taking norco 7.7 mg TID at least for the last 15 yrs, Anxiety for which she takes valium 5 mg at HS  PAST SURGICAL HISTORY: She  has a past surgical history that includes Cataract extraction, bilateral; Vitrectomy; and Spine surgery (05/25/10).  Allergies  Allergen Reactions  . Penicillins Anaphylaxis    Has patient had a PCN reaction causing immediate rash, facial/tongue/throat swelling, SOB or lightheadedness with hypotension: yes Has patient had a PCN reaction causing severe rash involving mucus membranes or skin necrosis: unknown Has patient had a PCN reaction that required hospitalization: unknown Has patient had a PCN reaction occurring within the last 10 years: no If all of the above answers are "NO", then may proceed with Cephalosporin use.   . Promethazine Hcl Diarrhea and Nausea And Vomiting    No current facility-administered medications on file prior to encounter.    Current Outpatient Prescriptions on File Prior to Encounter  Medication Sig  . BREO ELLIPTA 100-25 MCG/INH AEPB INHALE ONE PUFF INTO THE LUNGS ONCE DAILY  . buPROPion [facility]  XL) 150 MG 24 hr tablet Take 150 mg by mouth daily.  . calcium carbonate (OS-CAL) 600 MG TABS Take 600 mg by mouth daily.    Marland Kitchen. escitalopram (LEXAPRO) 10 MG tablet Take 10 mg by mouth daily.  . fluticasone (FLONASE) 50 MCG/ACT nasal spray PLACE 2 SPRAYS INTO BOTH NOSTRILS DAILY (Patient taking differently: PLACE 2 SPRAYS INTO BOTH NOSTRILS twice daily as needed for allergies)  . glucosamine-chondroitin 500-400 MG tablet Take 1 tablet by mouth 2 (two) times daily.  Marland Kitchen.  HYDROcodone-acetaminophen (NORCO) 7.5-325 MG tablet Take 0.5-1 tablets by mouth 3 (three) times daily as needed for pain.  Marland Kitchen. levothyroxine (SYNTHROID, LEVOTHROID) 50 MCG tablet Take 50 mcg by mouth daily.    . methylcellulose (ARTIFICIAL TEARS) 1 % ophthalmic solution Place 1 drop into both eyes daily as needed (allergies/dry eyes).   . Omega-3 Fatty Acids (FISH OIL) 1000 MG CAPS Take 1 capsule by mouth every evening.   . OXYGEN 24/7- 2lpm with sleep and 2-4 with exertion  . PE-GG-APAP & PE-DPH-APAP (MUCINEX SINUS-MAX DAY/NIGHT) Liquid MISC Take 5 mLs by mouth every 12 (twelve) hours as needed (allergies).  Marland Kitchen. PROAIR HFA 108 (90 Base) MCG/ACT inhaler INHALE UP TO 2 PUFFS EVERY 4 HOURS AS NEEDED (Patient taking differently: INHALE UP TO 2 PUFFS EVERY 4 HOURS AS NEEDED for shortness of breath/wheezing)  . SPIRIVA HANDIHALER 18 MCG inhalation capsule USE ONE (1) CAPSULE INHALATION ONCE DAILY (Patient taking differently: USE ONE (1) CAPSULE INHALATION every evening)  . levofloxacin (LEVAQUIN) 500 MG tablet Take 1 tablet (500 mg total) by mouth daily. (Patient not taking: Reported on 03/03/2016)  . oxyCODONE-acetaminophen (PERCOCET/ROXICET) 5-325 MG tablet Take 1 tablet by mouth every 4 (four) hours as needed for severe pain. (Patient not taking: Reported on 03/29/2016)  . predniSONE (DELTASONE) 10 MG tablet Take 4 tablets for 3 days, 3 tablets for 3 days, 2 tablets for 3 days, 1 tablet for 3 days (Patient not taking: Reported on 03/03/2016)  . Respiratory Therapy Supplies (FLUTTER) DEVI Use as directed    FAMILY HISTORY:  Her has no family status information on file.  No information obtained.   SOCIAL HISTORY: She  reports that she quit smoking about 6 years ago. Her smoking use included Cigarettes. She has a 20.00 pack-year smoking history. She has never used smokeless tobacco. She reports that she does not drink alcohol or use drugs.  She is a retired Engineer, civil (consulting)nurse.  Her husband is a Warden/rangerpsychologist. [place]   REVIEW OF SYSTEMS:   As mentioned. Recent SOB, cough, wheezing before the fall this weekend. More SOB last 1-2 days.   SUBJECTIVE:  As mentioned.   VITAL SIGNS: BP 164/76   Pulse 99   Temp 98 F (36.7 C) (Oral)   Resp 15   SpO2 100%   HEMODYNAMICS:    VENTILATOR SETTINGS:    INTAKE / OUTPUT: No intake/output data recorded.  PHYSICAL EXAMINATION: General:  Awake, Oriented x 3. In mild distress. On 100% NRBM.  Neuro:  CN grossly intact. (-) lateralizing signs HEENT:  (-) NVD, (-) oral thrush.  PERRLA.  Cardiovascular:  Good s1/s2. (-) s3/m/r/g Lungs:  (-) accessory muscle use. (-) subq air or crepitus.  (+) wheeze in L upper lung zones. Rhonchi in R mid lung zones. (-) crackles.  Abdomen:  (+) BS, soft, NT. Flat. (-) masses/tenderness.  Musculoskeletal:  (-) edema/clubbing/cyanosi. (+) cast in her RUE Skin:  Warm and dry. (-) rash.   LABS:  BMET  Recent Labs Lab 03/29/16 1211  NA 133*  K 3.1*  CL 92*  CO2 32  BUN 7  CREATININE 0.46  GLUCOSE 106*    Electrolytes  Recent Labs Lab 03/29/16 1211  CALCIUM 8.8*    CBC  Recent Labs Lab 03/29/16 1211  WBC 14.7*  HGB 10.3*  HCT 31.4*  PLT 274    Coag's No results for input(s): APTT, INR in the last 168 hours.  Sepsis Markers No results for input(s): LATICACIDVEN, PROCALCITON, O2SATVEN in the last 168 hours.  ABG No results for input(s): PHART, PCO2ART, PO2ART in the last 168 hours.  Liver Enzymes  Recent Labs Lab 03/29/16 1211  AST 34  ALT 21  ALKPHOS 117  BILITOT 0.8  ALBUMIN 3.9    Cardiac Enzymes No results for input(s): TROPONINI, PROBNP in the last 168 hours.  Glucose No results for input(s): GLUCAP in the last 168 hours.  Imaging Dg Chest 2 View  Result Date: 03/29/2016 CLINICAL DATA:  Fall. Recent humerus fracture. Cough. Congestion. COPD/asthma. EXAM: CHEST  2 VIEW COMPARISON:  02/10/2016 FINDINGS: Moderately degraded lateral view, secondary to patient  positioning, including arms not raised above the head. Mid thoracic compression deformity is moderate and similar. Midline trachea. Normal heart size. Atherosclerosis in the transverse aorta. No pleural fluid. Moderate left-sided pneumothorax, primarily identified inferiorly and laterally. On the order of 40%. Underlying COPD/chronic bronchitis, as evidenced by interstitial thickening and hyperinflation. IMPRESSION: Moderate size left-sided pneumothorax. Aortic atherosclerosis. Critical test results telephoned to dr. Adela Lankfloyd. at the time of interpretation at 11:56 a.m.on 03/29/2016. Electronically Signed   By: Jeronimo GreavesKyle  Talbot M.D.   On: 03/29/2016 11:56   Ct Head Wo Contrast  Result Date: 03/29/2016 CLINICAL DATA:  Recent falls.  Right arm fracture. EXAM: CT HEAD WITHOUT CONTRAST CT CERVICAL SPINE WITHOUT CONTRAST TECHNIQUE: Multidetector CT imaging of the head and cervical spine was performed following the standard protocol without intravenous contrast. Multiplanar CT image reconstructions of the cervical spine were also generated. COMPARISON:  Cervical spine myelogram 05/07/2010 FINDINGS: CT HEAD FINDINGS Brain: Mild generalized atrophy and white matter disease is somewhat advanced for age no acute infarct, hemorrhage, or mass lesion is present. The ventricles are proportionate to the degree of atrophy. No significant extra-axial fluid collection is present. Vascular: Atherosclerotic calcifications are present within the cavernous internal carotid arteries bilaterally. There is no hyperdense vessel. Skull: The calvarium is intact. No acute fractures are present. No focal lytic or blastic lesions are present. No significant extracranial soft tissue injury is present. Sinuses/Orbits: The paranasal sinuses and mastoid air cells are clear. Bilateral lens replacements are present. The globes and orbits are otherwise intact. CT CERVICAL SPINE FINDINGS Alignment: Slight retrolisthesis at C3-4 at C4-5 is stable. AP  alignment is otherwise anatomic. Skull base and vertebrae: The craniocervical junction is within normal limits. Soft tissues and spinal canal: Dense atherosclerotic calcifications are present at the carotid bifurcations bilaterally with possible stenosis on the left. The soft tissues the neck are otherwise unremarkable. The spinal canal is grossly patent. Disc levels: Multilevel stenosis of the cervical spine is again noted. The most severe level is at C4-5 with moderate central and bilateral foraminal stenosis. Upper chest: A prominent left pneumothorax is noted. This was called earlier today. Severe centrilobular emphysema is noted. IMPRESSION: 1. Persistent left-sided pneumothorax. 2. Emphysema. 3. Multilevel spondylosis of the cervical spine without acute fracture or traumatic subluxation. 4. Moderate atrophy and white matter disease likely reflects the sequela of chronic microvascular ischemia. 5. No  acute intracranial abnormality. These results will be called to the ordering clinician or representative by the Radiologist Assistant, and communication documented in the PACS or zVision Dashboard. Electronically Signed   By: Marin Roberts M.D.   On: 03/29/2016 14:23   Ct Cervical Spine Wo Contrast  Result Date: 03/29/2016 CLINICAL DATA:  Recent falls.  Right arm fracture. EXAM: CT HEAD WITHOUT CONTRAST CT CERVICAL SPINE WITHOUT CONTRAST TECHNIQUE: Multidetector CT imaging of the head and cervical spine was performed following the standard protocol without intravenous contrast. Multiplanar CT image reconstructions of the cervical spine were also generated. COMPARISON:  Cervical spine myelogram 05/07/2010 FINDINGS: CT HEAD FINDINGS Brain: Mild generalized atrophy and white matter disease is somewhat advanced for age no acute infarct, hemorrhage, or mass lesion is present. The ventricles are proportionate to the degree of atrophy. No significant extra-axial fluid collection is present. Vascular:  Atherosclerotic calcifications are present within the cavernous internal carotid arteries bilaterally. There is no hyperdense vessel. Skull: The calvarium is intact. No acute fractures are present. No focal lytic or blastic lesions are present. No significant extracranial soft tissue injury is present. Sinuses/Orbits: The paranasal sinuses and mastoid air cells are clear. Bilateral lens replacements are present. The globes and orbits are otherwise intact. CT CERVICAL SPINE FINDINGS Alignment: Slight retrolisthesis at C3-4 at C4-5 is stable. AP alignment is otherwise anatomic. Skull base and vertebrae: The craniocervical junction is within normal limits. Soft tissues and spinal canal: Dense atherosclerotic calcifications are present at the carotid bifurcations bilaterally with possible stenosis on the left. The soft tissues the neck are otherwise unremarkable. The spinal canal is grossly patent. Disc levels: Multilevel stenosis of the cervical spine is again noted. The most severe level is at C4-5 with moderate central and bilateral foraminal stenosis. Upper chest: A prominent left pneumothorax is noted. This was called earlier today. Severe centrilobular emphysema is noted. IMPRESSION: 1. Persistent left-sided pneumothorax. 2. Emphysema. 3. Multilevel spondylosis of the cervical spine without acute fracture or traumatic subluxation. 4. Moderate atrophy and white matter disease likely reflects the sequela of chronic microvascular ischemia. 5. No acute intracranial abnormality. These results will be called to the ordering clinician or representative by the Radiologist Assistant, and communication documented in the PACS or zVision Dashboard. Electronically Signed   By: Marin Roberts M.D.   On: 03/29/2016 14:23     STUDIES:  Chest ct scan 12/4   CULTURES: Sputum 12/4 > MRSA 12/4 Blood 12/4 >   ANTIBIOTICS:   SIGNIFICANT EVENTS:   LINES/TUBES:   DISCUSSION: Patient with severe COPD, on 3 L  oxygen 24/7, with recent acute exacerbation of COPD likely secondary to bronchitis/PNA, comes in after repeated falls over the weekend. Now with pneumothorax on the left lung base on chest x-ray. Patient is in mild distress, better on NRBM.   Assessment/Plan:  Acute on chronic hypoxemic respiratory failure secondary to acute exacerbation of COPD, pneumothorax in the left, rule out pneumonia, possible healthcare associated given recent ER visits. 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 Cyndia Skeeters) patient's overall condition and prognosis. Patient said that she is a full DO NOT RESUSCITATE. She does not want chest compression, ACLS, intubation. I discussed this in front of her husband who agreed with her decision. She was firm with her decision so I did not attempt in changing her code status given the acute issue. She wants to avoid a chest tube if possible.  She looked some better on NRBM.  I mentioned we will get a chest ct scan to determine the extent of PTX and decipher if she has severe COPD/significant blebs/bullae.  If ever, she will benefit from a small bore chest tube if possible.  I mentioned that we want [facility] to weigh in on her case as well.  I discussed the case with Dr. Adela Lank and the plan was to transfer the pt to [name] and have PCCM follow and have [facility] see her and decide what the best treatment for the pt.  - will hold off on Abx pending chest ct scan - will pan culture - start pulmicort nebs BID, duoneb QID - medrol 20 mg IV q12 - keep on NRBM 100% - chest ct scan no contrast  Chronic Pain/Anxiety  - Pt has been taking  NORCO 7.5/325 mg  3x/day for the last 15 yrs. I'm not sure if this is something to do with her repeated falls. Suggest continue her on PO pain meds (as she is in pain) before she goes into withdrawal.  - Pt also takes Valium 5 mg PRN at HS.   Repeated falls - This needs to be addressed. May need STR.   Plan discussed  with pt, husband, Dr. Adela Lank.   Pollie Meyer, MD Pulmonary and Critical Care Medicine [facility] Pager: [phone] After 3 pm or if no response, call [phone]  03/29/2016, 3:50 PM

## 2016-03-29 NOTE — ED Notes (Signed)
Patient transported to CT 

## 2016-03-29 NOTE — ED Notes (Signed)
Bed: WA20 Expected date:  Expected time:  Means of arrival:  Comments: EMS-fall 70 y/o

## 2016-03-29 NOTE — Procedures (Signed)
Chest Tube Insertion Procedure Note  Indications:  Clinically significant Pneumothorax  Pre-operative Diagnosis: Pneumothorax  Post-operative Diagnosis: Pneumothorax  Procedure Details  Informed consent was obtained for the procedure, including sedation.  Risks of lung perforation, hemorrhage, arrhythmia, and adverse drug reaction were discussed.   After sterile skin prep, using standard technique, a 20 French tube was placed in the left lateral 8 rib space.  Findings: None  Estimated Blood Loss:  Minimal         Specimens:  None              Complications:  None; patient tolerated the procedure well.         Disposition: remains in ER         Condition: stable  Attending Attestation: I performed the procedure.

## 2016-03-29 NOTE — Progress Notes (Addendum)
[address]       [place] [address]             [phone]        [name] Medical Record #[id number] Date of Birth: March 30, 1946  Referring: Dr Christene Slates  Primary Care: [name], MD  Chief Complaint:    Chief Complaint  Patient presents with  . Fall    History of Present Illness:     Increasing sob and chest pain today since admission this morning , Early today I had recommended ct and ct guided  chest tube placement and pulmonary consult  , but this did not happen.    Current Activity/ Functional Status: Patient is not independent with mobility/ambulation, transfers, ADL's, IADL's.   Zubrod Score: At the time of surgery this patient's most appropriate activity status/level should be described as: []     0    Normal activity, no symptoms []     1    Restricted in physical strenuous activity but ambulatory, able to do out light work []     2    Ambulatory and capable of self care, unable to do work activities, up and about                 more than 50%  Of the time                            []     3    Only limited self care, in bed greater than 50% of waking hours [x]     4    Completely disabled, no self care, confined to bed or chair []     5    Moribund  Past Medical History:  Diagnosis Date  . Alcohol abuse   . Anxiety   . Asthma   . COPD (chronic obstructive pulmonary disease) (HCC)   . Depression   . DJD (degenerative joint disease)   . Hypothyroidism   . Osteopenia   . Psoriatic arthritis (HCC) 06/02/2009  . Vitamin B12 deficiency     Past Surgical History:  Procedure Laterality Date  . CATARACT EXTRACTION, BILATERAL    . SPINE SURGERY  05/25/10   lesion removed from t11- Dr. Danielle Dess  . VITRECTOMY     right    History  Smoking Status  . Former Smoker  . Packs/day: 0.50  . Years: 40.00  . Types: Cigarettes  . Quit date: 04/26/2009  Smokeless Tobacco  . Never Used   History  Alcohol Use No     Social History   Social History  . Marital status: Married    Spouse name: N/A  . Number of children: N/A  . Years of education: N/A   Occupational History  . Office assistant    Social History Main Topics  . Smoking status: Former Smoker    Packs/day: 0.50    Years: 40.00    Types: Cigarettes    Quit date: 04/26/2009  . Smokeless tobacco: Never Used  . Alcohol use No  . Drug use: No  . Sexual activity: Not on file   Other Topics Concern  . Not on file   Social History Narrative   Married, but husband is ill   She is primary caregiver for her mother   Government social research officer, Former nurse    Allergies  Allergen Reactions  . Penicillins Anaphylaxis    Has patient had  a PCN reaction causing immediate rash, facial/tongue/throat swelling, SOB or lightheadedness with hypotension: yes Has patient had a PCN reaction causing severe rash involving mucus membranes or skin necrosis: unknown Has patient had a PCN reaction that required hospitalization: unknown Has patient had a PCN reaction occurring within the last 10 years: no If all of the above answers are "NO", then may proceed with Cephalosporin use.   . Promethazine Hcl Diarrhea and Nausea And Vomiting    Current Facility-Administered Medications  Medication Dose Route Frequency Provider Last Rate Last Dose  . lidocaine (PF) (XYLOCAINE) 1 % injection           . budesonide (PULMICORT) nebulizer solution 0.5 mg  0.5 mg Nebulization BID Jose Angelo A de Lucy Chris, MD      . ipratropium-albuterol (DUONEB) 0.5-2.5 (3) MG/3ML nebulizer solution 3 mL  3 mL Nebulization QID Jose Angelo A de Dios, MD      . ipratropium-albuterol (DUONEB) 0.5-2.5 (3) MG/3ML nebulizer solution 3 mL  3 mL Nebulization Q4H PRN Jose Angelo A Christene Slates, MD      . methylPREDNISolone sodium succinate (SOLU-MEDROL) 40 mg/mL injection 40 mg  40 mg Intravenous Q12H Jose Alexis Frock, MD   40 mg at 03/29/16 1805   Current Outpatient Prescriptions  Medication Sig  Dispense Refill  . BREO ELLIPTA 100-25 MCG/INH AEPB INHALE ONE PUFF INTO THE LUNGS ONCE DAILY 60 each 5  . buPROPion (WELLBUTRIN XL) 150 MG 24 hr tablet Take 150 mg by mouth daily.    . calcium carbonate (OS-CAL) 600 MG TABS Take 600 mg by mouth daily.      Marland Kitchen escitalopram (LEXAPRO) 10 MG tablet Take 10 mg by mouth daily.    . fluticasone (FLONASE) 50 MCG/ACT nasal spray PLACE 2 SPRAYS INTO BOTH NOSTRILS DAILY (Patient taking differently: PLACE 2 SPRAYS INTO BOTH NOSTRILS twice daily as needed for allergies) 16 g 5  . glucosamine-chondroitin 500-400 MG tablet Take 1 tablet by mouth 2 (two) times daily.    Marland Kitchen HYDROcodone-acetaminophen (NORCO) 7.5-325 MG tablet Take 0.5-1 tablets by mouth 3 (three) times daily as needed for pain.    Marland Kitchen levothyroxine (SYNTHROID, LEVOTHROID) 50 MCG tablet Take 50 mcg by mouth daily.      . methylcellulose (ARTIFICIAL TEARS) 1 % ophthalmic solution Place 1 drop into both eyes daily as needed (allergies/dry eyes).     . Omega-3 Fatty Acids (FISH OIL) 1000 MG CAPS Take 1 capsule by mouth every evening.     Marland Kitchen oxyCODONE (OXY IR/ROXICODONE) 5 MG immediate release tablet Take 5-10 mg by mouth every 4 (four) hours as needed for pain.    . OXYGEN 24/7- 2lpm with sleep and 2-4 with exertion    . PE-GG-APAP & PE-DPH-APAP (MUCINEX SINUS-MAX DAY/NIGHT) Liquid MISC Take 5 mLs by mouth every 12 (twelve) hours as needed (allergies).    Marland Kitchen PROAIR HFA 108 (90 Base) MCG/ACT inhaler INHALE UP TO 2 PUFFS EVERY 4 HOURS AS NEEDED (Patient taking differently: INHALE UP TO 2 PUFFS EVERY 4 HOURS AS NEEDED for shortness of breath/wheezing) 8.5 g 5  . SPIRIVA HANDIHALER 18 MCG inhalation capsule USE ONE (1) CAPSULE INHALATION ONCE DAILY (Patient taking differently: USE ONE (1) CAPSULE INHALATION every evening) 30 capsule 5  . levofloxacin (LEVAQUIN) 500 MG tablet Take 1 tablet (500 mg total) by mouth daily. (Patient not taking: Reported on 03/03/2016) 7 tablet 0  . oxyCODONE-acetaminophen  (PERCOCET/ROXICET) 5-325 MG tablet Take 1 tablet by mouth every 4 (four) hours as  needed for severe pain. (Patient not taking: Reported on 03/29/2016) 15 tablet 0  . predniSONE (DELTASONE) 10 MG tablet Take 4 tablets for 3 days, 3 tablets for 3 days, 2 tablets for 3 days, 1 tablet for 3 days (Patient not taking: Reported on 03/03/2016) 30 tablet 0  . Respiratory Therapy Supplies (FLUTTER) DEVI Use as directed 1 each 0     No family history on file.   Review of Systems:      Cardiac Review of Systems: Y or N  Chest Pain [ y   ]  Resting SOB [ y  ] Exertional SOB  [ y ]  Orthopnea [  ]   Pedal Edema [   ]    Palpitations [  ] Syncope  [  ]   Presyncope [   ]  General Review of Systems: [Y] = yes [  ]=no Constitional: recent weight change [  ]; anorexia [  ]; fatigue [  ]; nausea [  ]; night sweats [  ]; fever [  ]; or chills [  ]                                                               Dental: poor dentition[  ]; Last Dentist visit:   Eye : blurred vision [  ]; diplopia [   ]; vision changes [  ];  Amaurosis fugax[  ]; Resp: cough [  ];  wheezing[  ];  hemoptysis[  ]; shortness of breath[  ]; paroxysmal nocturnal dyspnea[  ]; dyspnea on exertion[  ]; or orthopnea[  ];  GI:  gallstones[  ], vomiting[  ];  dysphagia[  ]; melena[  ];  hematochezia [  ]; heartburn[  ];   Hx of  Colonoscopy[  ]; GU: kidney stones [  ]; hematuria[  ];   dysuria [  ];  nocturia[  ];  history of     obstruction [  ]; urinary frequency [  ]             Skin: rash, swelling[  ];, hair loss[  ];  peripheral edema[  ];  or itching[  ]; Musculosketetal: myalgias[  ];  joint swelling[  ];  joint erythema[  ];  joint pain[  ];  back pain[  ];  Heme/Lymph: bruising[  ];  bleeding[  ];  anemia[  ];  Neuro: TIA[  ];  headaches[  ];  stroke[  ];  vertigo[  ];  seizures[  ];   paresthesias[  ];  difficulty walking[  ];  Psych:depression[  ]; anxiety[  ];  Endocrine: diabetes[  ];  thyroid dysfunction[  ];  Immunizations:  Flu [  ]; Pneumococcal[  ];  Other:  Physical Exam: BP 140/65   Pulse 101   Temp 98 F (36.7 C) (Oral)   Resp 21   SpO2 100%   On 100 % non rebreather General appearance: cooperative, appears older than stated age and moderate distress Head: Normocephalic, without obvious abnormality, atraumatic Neck: no adenopathy, no carotid bruit, no JVD, supple, symmetrical, trachea midline and thyroid not enlarged, symmetric, no tenderness/mass/nodules Lymph nodes: Cervical, supraclavicular, and axillary nodes normal. Resp: diminished breath sounds LLL Back: symmetric, no curvature. ROM normal. No CVA tenderness. Cardio: regular rate  and rhythm, S1, S2 normal, no murmur, click, rub or gallop GI: soft, non-tender; bowel sounds normal; no masses,  no organomegaly Extremities: left extremities normal, atraumatic, no cyanosis or edema and Homans sign is negative, no sign of DVT- right arm treated fracture upper arm Neurologic: Grossly normal  Diagnostic Studies & Laboratory data:     Recent Radiology Findings:   Dg Chest 2 View  Result Date: 03/29/2016 CLINICAL DATA:  Fall. Recent humerus fracture. Cough. Congestion. COPD/asthma. EXAM: CHEST  2 VIEW COMPARISON:  02/10/2016 FINDINGS: Moderately degraded lateral view, secondary to patient positioning, including arms not raised above the head. Mid thoracic compression deformity is moderate and similar. Midline trachea. Normal heart size. Atherosclerosis in the transverse aorta. No pleural fluid. Moderate left-sided pneumothorax, primarily identified inferiorly and laterally. On the order of 40%. Underlying COPD/chronic bronchitis, as evidenced by interstitial thickening and hyperinflation. IMPRESSION: Moderate size left-sided pneumothorax. Aortic atherosclerosis. Critical test results telephoned to dr. Adela Lank. at the time of interpretation at 11:56 a.m.on 03/29/2016. Electronically Signed   By: Jeronimo Greaves M.D.   On: 03/29/2016 11:56   Ct Head Wo  Contrast  Result Date: 03/29/2016 CLINICAL DATA:  Recent falls.  Right arm fracture. EXAM: CT HEAD WITHOUT CONTRAST CT CERVICAL SPINE WITHOUT CONTRAST TECHNIQUE: Multidetector CT imaging of the head and cervical spine was performed following the standard protocol without intravenous contrast. Multiplanar CT image reconstructions of the cervical spine were also generated. COMPARISON:  Cervical spine myelogram 05/07/2010 FINDINGS: CT HEAD FINDINGS Brain: Mild generalized atrophy and white matter disease is somewhat advanced for age no acute infarct, hemorrhage, or mass lesion is present. The ventricles are proportionate to the degree of atrophy. No significant extra-axial fluid collection is present. Vascular: Atherosclerotic calcifications are present within the cavernous internal carotid arteries bilaterally. There is no hyperdense vessel. Skull: The calvarium is intact. No acute fractures are present. No focal lytic or blastic lesions are present. No significant extracranial soft tissue injury is present. Sinuses/Orbits: The paranasal sinuses and mastoid air cells are clear. Bilateral lens replacements are present. The globes and orbits are otherwise intact. CT CERVICAL SPINE FINDINGS Alignment: Slight retrolisthesis at C3-4 at C4-5 is stable. AP alignment is otherwise anatomic. Skull base and vertebrae: The craniocervical junction is within normal limits. Soft tissues and spinal canal: Dense atherosclerotic calcifications are present at the carotid bifurcations bilaterally with possible stenosis on the left. The soft tissues the neck are otherwise unremarkable. The spinal canal is grossly patent. Disc levels: Multilevel stenosis of the cervical spine is again noted. The most severe level is at C4-5 with moderate central and bilateral foraminal stenosis. Upper chest: A prominent left pneumothorax is noted. This was called earlier today. Severe centrilobular emphysema is noted. IMPRESSION: 1. Persistent left-sided  pneumothorax. 2. Emphysema. 3. Multilevel spondylosis of the cervical spine without acute fracture or traumatic subluxation. 4. Moderate atrophy and white matter disease likely reflects the sequela of chronic microvascular ischemia. 5. No acute intracranial abnormality. These results will be called to the ordering clinician or representative by the Radiologist Assistant, and communication documented in the PACS or zVision Dashboard. Electronically Signed   By: Marin Roberts M.D.   On: 03/29/2016 14:23   Ct Chest Wo Contrast  Result Date: 03/29/2016 CLINICAL DATA:  Chest tightness and worsening cough in patient with history of COPD. Pneumothorax by plain films earlier today. EXAM: CT CHEST WITHOUT CONTRAST TECHNIQUE: Multidetector CT imaging of the chest was performed following the standard protocol without IV contrast. COMPARISON:  PA  and lateral chest this same day and 07/31/2015. CT chest 11/20/2012. FINDINGS: Cardiovascular: Calcific aortic and coronary atherosclerosis is identified. No pericardial effusion. Heart size is normal. Mediastinum/Nodes: No enlarged mediastinal or axillary lymph nodes. Thyroid gland, trachea and esophagus demonstrate no significant findings. Lungs/Pleura: The patient has a known left hydropneumothorax with only a small amount of pleural fluid. Pneumothorax estimated at 70%. The lungs demonstrate extensive emphysematous disease. There is some atelectasis bilaterally, worse on the left. Upper Abdomen: The patient is status post cholecystectomy. No acute abnormality is identified. Musculoskeletal: T7 and T8 compression fracture appears are identified as seen 07/31/2015 exam. No worrisome lesion is identified. IMPRESSION: Left hydropneumothorax. There is only a small amount of pleural fluid. Pneumothorax is estimated at 70%. Severe emphysema. Atherosclerosis. Critical Value/emergent results were called by telephone at the time of interpretation on 03/29/2016 at 5:25 pm to [facility]  MEYER, P.A., who verbally acknowledged these results. Electronically Signed   By: Drusilla Kannerhomas  Dalessio M.D.   On: 03/29/2016 17:27   Ct Cervical Spine Wo Contrast  Result Date: 03/29/2016 CLINICAL DATA:  Recent falls.  Right arm fracture. EXAM: CT HEAD WITHOUT CONTRAST CT CERVICAL SPINE WITHOUT CONTRAST TECHNIQUE: Multidetector CT imaging of the head and cervical spine was performed following the standard protocol without intravenous contrast. Multiplanar CT image reconstructions of the cervical spine were also generated. COMPARISON:  Cervical spine myelogram 05/07/2010 FINDINGS: CT HEAD FINDINGS Brain: Mild generalized atrophy and white matter disease is somewhat advanced for age no acute infarct, hemorrhage, or mass lesion is present. The ventricles are proportionate to the degree of atrophy. No significant extra-axial fluid collection is present. Vascular: Atherosclerotic calcifications are present within the cavernous internal carotid arteries bilaterally. There is no hyperdense vessel. Skull: The calvarium is intact. No acute fractures are present. No focal lytic or blastic lesions are present. No significant extracranial soft tissue injury is present. Sinuses/Orbits: The paranasal sinuses and mastoid air cells are clear. Bilateral lens replacements are present. The globes and orbits are otherwise intact. CT CERVICAL SPINE FINDINGS Alignment: Slight retrolisthesis at C3-4 at C4-5 is stable. AP alignment is otherwise anatomic. Skull base and vertebrae: The craniocervical junction is within normal limits. Soft tissues and spinal canal: Dense atherosclerotic calcifications are present at the carotid bifurcations bilaterally with possible stenosis on the left. The soft tissues the neck are otherwise unremarkable. The spinal canal is grossly patent. Disc levels: Multilevel stenosis of the cervical spine is again noted. The most severe level is at C4-5 with moderate central and bilateral foraminal stenosis. Upper  chest: A prominent left pneumothorax is noted. This was called earlier today. Severe centrilobular emphysema is noted. IMPRESSION: 1. Persistent left-sided pneumothorax. 2. Emphysema. 3. Multilevel spondylosis of the cervical spine without acute fracture or traumatic subluxation. 4. Moderate atrophy and white matter disease likely reflects the sequela of chronic microvascular ischemia. 5. No acute intracranial abnormality. These results will be called to the ordering clinician or representative by the Radiologist Assistant, and communication documented in the PACS or zVision Dashboard. Electronically Signed   By: Marin Robertshristopher  Mattern M.D.   On: 03/29/2016 14:23   Dg Knee Complete 4 Views Right  Result Date: 03/29/2016 CLINICAL DATA:  Fall EXAM: RIGHT KNEE - COMPLETE 4+ VIEW COMPARISON:  None. FINDINGS: Four views of the right knee submitted. No acute fracture or subluxation. There is mild narrowing of medial joint compartment. Chondrocalcinosis is noted. Mild narrowing of patellofemoral joint space. IMPRESSION: No acute fracture or subluxation. Chondrocalcinosis. Osteoarthritic changes. Electronically Signed   By:  Natasha Mead M.D.   On: 03/29/2016 17:26     I have independently reviewed the above radiologic studies.  Recent Lab Findings: Lab Results  Component Value Date   WBC 14.7 (H) 03/29/2016   HGB 10.3 (L) 03/29/2016   HCT 31.4 (L) 03/29/2016   PLT 274 03/29/2016   GLUCOSE 106 (H) 03/29/2016   ALT 21 03/29/2016   AST 34 03/29/2016   NA 133 (L) 03/29/2016   K 3.1 (L) 03/29/2016   CL 92 (L) 03/29/2016   CREATININE 0.46 03/29/2016   BUN 7 03/29/2016   CO2 32 03/29/2016      Assessment / Plan:    Left pneumothorax dnr  Status patient agreeable to left chest tube placement - with her over poor status chest tube will have to be sole treatment for her ptx, she is nmot candidaite for VATS Consult pulmonary to follow at W/L  Ct to 20 cm suction  Patient is agreeable to placement of chest  tube for relief of sob.   I  spent 30 minutes counseling the patient face to face and 50% or more the  time was spent in counseling and coordination of care. The total time spent in the appointment was 40 minutes.    Delight Ovens MD      [address] [place] [place] [address] Office [phone]   Alvino [facility] [phone]  03/29/2016 7:02 PM     Patient ID: [name], female   DOB: August 19, 1945, 70 y.o.   MRN: [id number]

## 2016-03-29 NOTE — ED Notes (Signed)
I attempted to collect labs and was unsuccessful. 

## 2016-03-29 NOTE — ED Provider Notes (Signed)
[facility]-EMERGENCY DEPT Provider Note   CSN: [id number] Arrival date & time: 03/29/16  1005     History   Chief Complaint Chief Complaint  Patient presents with  . Fall    HPI Cheyenne [name] is a 70 y.o. female with pmh of ETOH abuse, COPD Gold stage 4 (most recent exacerbation 02/10/16 treated as outpatient), hypothyroidism, osteopenia, multiple back surgeries with chronic back pain presents with multiple complaints.  Pt reports "pain everywhere" including pain in neck, back, R knee, R arm and chest from recent falls.  First fall occurred on 03/03/16 when pt was brought to ED after fall on 03/03/16, unfortunately pt found to have R humeral fracture, discharged with ortho follow up.  Pt has since followed up with ortho and is doing PT sessions for R humerus fracture.  Pt states she fell again two days ago going down the stairs, pt fell and struck her chest directly on the edge of a step.  Pt is now having L sided chest wall pain since this fall.  Pt states last night she tried to get out of bed to use the bathroom but was unable to get on her feet - she states her feet kept sliding all over the place and she couldn't stand up.  Pt fell once last night, and struck her R knee on the floor.  Pt denies extremity numbness or weakness, balance issues, bladder incontinence or retention.  Pt had to ask husband for help to stand up and walk to bathroom, pt states this is NEW.  Pt is usually ambulatory at home without assist, at base line.  Pt not on anticoagulation.  Pt denies striking head during her recent 3 falls.  Pt also states her chest feels tight and she has had increased cough, shortness of breath and inability to cough up/clear sputum for the last few days since striking her chest.  Pt states her chest "doesn't feel right" and is unable to clear phlegm like usual.  She denies fevers.  Last COPD exacerbation was 02/10/16.  Pt is on 2L oxygen at rest and 3-4 L oxygen on exertion at home.  No h/o DVT/PEs.     Lastly pt states she has been experiencing urgency and frequency, denies burning with urination or blood in urine.    HPI  Past Medical History:  Diagnosis Date  . Alcohol abuse   . Anxiety   . Asthma   . COPD (chronic obstructive pulmonary disease) (HCC)   . Depression   . DJD (degenerative joint disease)   . Hypothyroidism   . Osteopenia   . Psoriatic arthritis (HCC) 06/02/2009  . Vitamin B12 deficiency     Patient Active Problem List   Diagnosis Date Noted  . COPD exacerbation (HCC) 11/13/2015  . Acute bronchitis 05/22/2015  . Chronic respiratory failure with hypercapnia (HCC) 08/05/2014  . Allergic rhinitis 01/23/2014  . Lung nodule 05/04/2012  . COPD  D  06/02/2009  . HYPOTHYROIDISM 05/07/2009  . VITAMIN B12 DEFICIENCY 05/07/2009  . ANXIETY 05/07/2009  . ALCOHOL ABUSE 05/07/2009  . DEPRESSION 05/07/2009  . ASTHMA 05/07/2009  . DEGENERATIVE JOINT DISEASE 05/07/2009  . OSTEOPENIA 05/07/2009    Past Surgical History:  Procedure Laterality Date  . CATARACT EXTRACTION, BILATERAL    . SPINE SURGERY  05/25/10   lesion removed from t11- Dr. Danielle DessElsner  . VITRECTOMY     right    OB History    No data available       Home Medications  Prior to Admission medications   Medication Sig Start Date End Date Taking? Authorizing Provider  BREO ELLIPTA 100-25 MCG/INH AEPB INHALE ONE PUFF INTO THE LUNGS ONCE DAILY 12/10/15  Yes Leslye Peer, MD  buPROPion (WELLBUTRIN XL) 150 MG 24 hr tablet Take 150 mg by mouth daily.   Yes Historical Provider, MD  calcium carbonate (OS-CAL) 600 MG TABS Take 600 mg by mouth daily.     Yes Historical Provider, MD  escitalopram (LEXAPRO) 10 MG tablet Take 10 mg by mouth daily. 02/23/16  Yes Historical Provider, MD  fluticasone (FLONASE) 50 MCG/ACT nasal spray PLACE 2 SPRAYS INTO BOTH NOSTRILS DAILY Patient taking differently: PLACE 2 SPRAYS INTO BOTH NOSTRILS twice daily as needed for allergies 06/18/15  Yes Leslye Peer, MD    glucosamine-chondroitin 500-400 MG tablet Take 1 tablet by mouth 2 (two) times daily.   Yes Historical Provider, MD  HYDROcodone-acetaminophen (NORCO) 7.5-325 MG tablet Take 0.5-1 tablets by mouth 3 (three) times daily as needed for pain. 02/19/16  Yes Historical Provider, MD  levothyroxine (SYNTHROID, LEVOTHROID) 50 MCG tablet Take 50 mcg by mouth daily.     Yes Historical Provider, MD  methylcellulose (ARTIFICIAL TEARS) 1 % ophthalmic solution Place 1 drop into both eyes daily as needed (allergies/dry eyes).    Yes Historical Provider, MD  Omega-3 Fatty Acids (FISH OIL) 1000 MG CAPS Take 1 capsule by mouth every evening.    Yes Historical Provider, MD  oxyCODONE (OXY IR/ROXICODONE) 5 MG immediate release tablet Take 5-10 mg by mouth every 4 (four) hours as needed for pain. 03/10/16  Yes Historical Provider, MD  OXYGEN 24/7- 2lpm with sleep and 2-4 with exertion   Yes Historical Provider, MD  PE-GG-APAP & PE-DPH-APAP (MUCINEX SINUS-MAX DAY/NIGHT) Liquid MISC Take 5 mLs by mouth every 12 (twelve) hours as needed (allergies).   Yes Historical Provider, MD  PROAIR HFA 108 (90 Base) MCG/ACT inhaler INHALE UP TO 2 PUFFS EVERY 4 HOURS AS NEEDED Patient taking differently: INHALE UP TO 2 PUFFS EVERY 4 HOURS AS NEEDED for shortness of breath/wheezing 02/23/16  Yes Leslye Peer, MD  SPIRIVA HANDIHALER 18 MCG inhalation capsule USE ONE (1) CAPSULE INHALATION ONCE DAILY Patient taking differently: USE ONE (1) CAPSULE INHALATION every evening 12/10/15  Yes Leslye Peer, MD  levofloxacin (LEVAQUIN) 500 MG tablet Take 1 tablet (500 mg total) by mouth daily. Patient not taking: Reported on 03/03/2016 02/10/16   Leslye Peer, MD  oxyCODONE-acetaminophen (PERCOCET/ROXICET) 5-325 MG tablet Take 1 tablet by mouth every 4 (four) hours as needed for severe pain. Patient not taking: Reported on 03/29/2016 03/03/16   Azalia Bilis, MD  predniSONE (DELTASONE) 10 MG tablet Take 4 tablets for 3 days, 3 tablets for 3 days,  2 tablets for 3 days, 1 tablet for 3 days Patient not taking: Reported on 03/03/2016 02/10/16   Leslye Peer, MD  Respiratory Therapy Supplies (FLUTTER) DEVI Use as directed 08/08/14   Nyoka Cowden, MD    Family History No family history on file.  Social History Social History  Substance Use Topics  . Smoking status: Former Smoker    Packs/day: 0.50    Years: 40.00    Types: Cigarettes    Quit date: 04/26/2009  . Smokeless tobacco: Never Used  . Alcohol use No     Allergies   Penicillins and Promethazine hcl   Review of Systems Review of Systems  Constitutional: Negative for chills, fatigue and fever.  HENT: Negative for sneezing and  sore throat.   Eyes: Negative for visual disturbance.  Respiratory: Positive for cough, chest tightness, shortness of breath and wheezing.   Cardiovascular: Negative for chest pain, palpitations and leg swelling.  Gastrointestinal: Negative for abdominal pain, blood in stool, constipation, diarrhea, nausea and vomiting.  Genitourinary: Positive for frequency. Negative for hematuria.  Musculoskeletal: Positive for back pain and neck pain.  Skin: Positive for wound (superficial abrasions on anterior lower leg).  Neurological: Positive for weakness. Negative for dizziness, tremors, syncope, facial asymmetry, speech difficulty, light-headedness and numbness.  Psychiatric/Behavioral: Negative for confusion.     Physical Exam Updated Vital Signs BP 137/77   Pulse 93   Temp 98 F (36.7 C) (Oral)   Resp 19   SpO2 100%   Physical Exam  Constitutional: She is oriented to person, place, and time. No distress.  Frail elderly woman on 2L [place]  HENT:  Head: Normocephalic and atraumatic.  Nose: Nose normal.  Mouth/Throat: Oropharynx is clear and moist. No oropharyngeal exudate.  Eyes: EOM are normal. Pupils are equal, round, and reactive to light.  Neck: Neck supple. No JVD present.  Cardiovascular: Normal rate, regular rhythm, normal heart  sounds and intact distal pulses.   No murmur heard. Pulmonary/Chest: No respiratory distress.  94-96% O2 on 2L [place], borderline tachypnic. Expiratory wheezing throughout, rhonchi most significant at bilateral lower lobes.   Abdominal: Soft. Bowel sounds are normal. She exhibits no distension. There is no tenderness. There is no guarding.  Musculoskeletal: Normal range of motion. She exhibits no edema.  L sided chest wall tenderness to deep palpation  Neurological: She is alert and oriented to person, place, and time. No cranial nerve deficit.  Skin: Skin is warm and dry. Capillary refill takes less than 2 seconds. She is not diaphoretic.  Psychiatric: She has a normal mood and affect.     ED Treatments / Results  Labs (all labs ordered are listed, but only abnormal results are displayed) Labs Reviewed  CBC WITH DIFFERENTIAL/PLATELET - Abnormal; Notable for the following:       Result Value   WBC 14.7 (*)    RBC 3.39 (*)    Hemoglobin 10.3 (*)    HCT 31.4 (*)    Neutro Abs 11.9 (*)    Monocytes Absolute 1.5 (*)    All other components within normal limits  URINALYSIS, ROUTINE W REFLEX MICROSCOPIC (NOT AT [facility]) - Abnormal; Notable for the following:    Hgb urine dipstick SMALL (*)    Ketones, ur 40 (*)    All other components within normal limits  COMPREHENSIVE METABOLIC PANEL - Abnormal; Notable for the following:    Sodium 133 (*)    Potassium 3.1 (*)    Chloride 92 (*)    Glucose, Bld 106 (*)    Calcium 8.8 (*)    All other components within normal limits  URINE MICROSCOPIC-ADD ON - Abnormal; Notable for the following:    Squamous Epithelial / LPF 0-5 (*)    Bacteria, UA RARE (*)    All other components within normal limits  MRSA PCR SCREENING  CULTURE, EXPECTORATED SPUTUM-ASSESSMENT  CULTURE, BLOOD (ROUTINE X 2)  CULTURE, BLOOD (ROUTINE X 2)  I-STAT TROPOININ, ED  Rosezena Sensor, ED    EKG  EKG Interpretation None       Radiology Dg Chest 2 View  Result  Date: 03/29/2016 CLINICAL DATA:  Fall. Recent humerus fracture. Cough. Congestion. COPD/asthma. EXAM: CHEST  2 VIEW COMPARISON:  02/10/2016 FINDINGS: Moderately degraded lateral view,  secondary to patient positioning, including arms not raised above the head. Mid thoracic compression deformity is moderate and similar. Midline trachea. Normal heart size. Atherosclerosis in the transverse aorta. No pleural fluid. Moderate left-sided pneumothorax, primarily identified inferiorly and laterally. On the order of 40%. Underlying COPD/chronic bronchitis, as evidenced by interstitial thickening and hyperinflation. IMPRESSION: Moderate size left-sided pneumothorax. Aortic atherosclerosis. Critical test results telephoned to dr. Adela Lankfloyd. at the time of interpretation at 11:56 a.m.on 03/29/2016. Electronically Signed   By: Jeronimo GreavesKyle  Talbot M.D.   On: 03/29/2016 11:56   Ct Head Wo Contrast  Result Date: 03/29/2016 CLINICAL DATA:  Recent falls.  Right arm fracture. EXAM: CT HEAD WITHOUT CONTRAST CT CERVICAL SPINE WITHOUT CONTRAST TECHNIQUE: Multidetector CT imaging of the head and cervical spine was performed following the standard protocol without intravenous contrast. Multiplanar CT image reconstructions of the cervical spine were also generated. COMPARISON:  Cervical spine myelogram 05/07/2010 FINDINGS: CT HEAD FINDINGS Brain: Mild generalized atrophy and white matter disease is somewhat advanced for age no acute infarct, hemorrhage, or mass lesion is present. The ventricles are proportionate to the degree of atrophy. No significant extra-axial fluid collection is present. Vascular: Atherosclerotic calcifications are present within the cavernous internal carotid arteries bilaterally. There is no hyperdense vessel. Skull: The calvarium is intact. No acute fractures are present. No focal lytic or blastic lesions are present. No significant extracranial soft tissue injury is present. Sinuses/Orbits: The paranasal sinuses and  mastoid air cells are clear. Bilateral lens replacements are present. The globes and orbits are otherwise intact. CT CERVICAL SPINE FINDINGS Alignment: Slight retrolisthesis at C3-4 at C4-5 is stable. AP alignment is otherwise anatomic. Skull base and vertebrae: The craniocervical junction is within normal limits. Soft tissues and spinal canal: Dense atherosclerotic calcifications are present at the carotid bifurcations bilaterally with possible stenosis on the left. The soft tissues the neck are otherwise unremarkable. The spinal canal is grossly patent. Disc levels: Multilevel stenosis of the cervical spine is again noted. The most severe level is at C4-5 with moderate central and bilateral foraminal stenosis. Upper chest: A prominent left pneumothorax is noted. This was called earlier today. Severe centrilobular emphysema is noted. IMPRESSION: 1. Persistent left-sided pneumothorax. 2. Emphysema. 3. Multilevel spondylosis of the cervical spine without acute fracture or traumatic subluxation. 4. Moderate atrophy and white matter disease likely reflects the sequela of chronic microvascular ischemia. 5. No acute intracranial abnormality. These results will be called to the ordering clinician or representative by the Radiologist Assistant, and communication documented in the PACS or zVision Dashboard. Electronically Signed   By: Marin Robertshristopher  Mattern M.D.   On: 03/29/2016 14:23   Ct Cervical Spine Wo Contrast  Result Date: 03/29/2016 CLINICAL DATA:  Recent falls.  Right arm fracture. EXAM: CT HEAD WITHOUT CONTRAST CT CERVICAL SPINE WITHOUT CONTRAST TECHNIQUE: Multidetector CT imaging of the head and cervical spine was performed following the standard protocol without intravenous contrast. Multiplanar CT image reconstructions of the cervical spine were also generated. COMPARISON:  Cervical spine myelogram 05/07/2010 FINDINGS: CT HEAD FINDINGS Brain: Mild generalized atrophy and white matter disease is somewhat  advanced for age no acute infarct, hemorrhage, or mass lesion is present. The ventricles are proportionate to the degree of atrophy. No significant extra-axial fluid collection is present. Vascular: Atherosclerotic calcifications are present within the cavernous internal carotid arteries bilaterally. There is no hyperdense vessel. Skull: The calvarium is intact. No acute fractures are present. No focal lytic or blastic lesions are present. No significant extracranial soft tissue injury  is present. Sinuses/Orbits: The paranasal sinuses and mastoid air cells are clear. Bilateral lens replacements are present. The globes and orbits are otherwise intact. CT CERVICAL SPINE FINDINGS Alignment: Slight retrolisthesis at C3-4 at C4-5 is stable. AP alignment is otherwise anatomic. Skull base and vertebrae: The craniocervical junction is within normal limits. Soft tissues and spinal canal: Dense atherosclerotic calcifications are present at the carotid bifurcations bilaterally with possible stenosis on the left. The soft tissues the neck are otherwise unremarkable. The spinal canal is grossly patent. Disc levels: Multilevel stenosis of the cervical spine is again noted. The most severe level is at C4-5 with moderate central and bilateral foraminal stenosis. Upper chest: A prominent left pneumothorax is noted. This was called earlier today. Severe centrilobular emphysema is noted. IMPRESSION: 1. Persistent left-sided pneumothorax. 2. Emphysema. 3. Multilevel spondylosis of the cervical spine without acute fracture or traumatic subluxation. 4. Moderate atrophy and white matter disease likely reflects the sequela of chronic microvascular ischemia. 5. No acute intracranial abnormality. These results will be called to the ordering clinician or representative by the Radiologist Assistant, and communication documented in the PACS or zVision Dashboard. Electronically Signed   By: Marin Roberts M.D.   On: 03/29/2016 14:23     Procedures Procedures   CRITICAL CARE Performed by: Liberty Handy   Total critical care time: 40 minutes  Critical care time was exclusive of separately billable procedures and treating other patients.  Critical care was necessary to treat or prevent imminent or life-threatening deterioration.  Critical care was time spent personally by me on the following activities: development of treatment plan with patient and/or surrogate as well as nursing, discussions with consultants, evaluation of patient's response to treatment, examination of patient, obtaining history from patient or surrogate, ordering and performing treatments and interventions, ordering and review of laboratory studies, ordering and review of radiographic studies, pulse oximetry and re-evaluation of patient's condition.   Medications Ordered in ED Medications  budesonide (PULMICORT) nebulizer solution 0.5 mg (not administered)  ipratropium-albuterol (DUONEB) 0.5-2.5 (3) MG/3ML nebulizer solution 3 mL (3 mLs Nebulization Not Given 03/29/16 1627)  ipratropium-albuterol (DUONEB) 0.5-2.5 (3) MG/3ML nebulizer solution 3 mL (not administered)  methylPREDNISolone sodium succinate (SOLU-MEDROL) 40 mg/mL injection 40 mg (not administered)  morphine 2 MG/ML injection 2 mg (2 mg Intravenous Given 03/29/16 1213)  ondansetron (ZOFRAN) injection 4 mg (4 mg Intravenous Given 03/29/16 1213)  sodium chloride 0.9 % bolus 500 mL (0 mLs Intravenous Stopped 03/29/16 1345)  0.9 %  sodium chloride infusion ( Intravenous New Bag/Given 03/29/16 1345)  morphine 2 MG/ML injection 2 mg (2 mg Intravenous Given 03/29/16 1544)  ondansetron (ZOFRAN) injection 4 mg (4 mg Intravenous Given 03/29/16 1544)  ipratropium-albuterol (DUONEB) 0.5-2.5 (3) MG/3ML nebulizer solution 3 mL (3 mLs Nebulization Given 03/29/16 1626)     Initial Impression / Assessment and Plan / ED Course  I have reviewed the triage vital signs and the nursing notes.  Pertinent  labs & imaging results that were available during my care of the patient were reviewed by me and considered in my medical decision making (see chart for details).  Clinical Course as of Mar 30 1631  Mon Mar 29, 2016  1330 DG Chest 2 View [CG]    Clinical Course User Index [CG] Liberty Handy, [place]   70 yo female with pertinent pmh of COPD GOLD D disease, hypothyroidism, osteopenia and recent falls presents with multiple symptoms including pain from multiple falls, urgency/frequency and chest pain and shortness of pain.  Pt mostly concerned about shortness of breath, chest wall pain and increased sputum production/ina bility to cough up or clear sputum.  Pt fell and struck her chest against edge of steps 2 days ago.  On exam pt is afebrile, tachypnic, hypertensive with O2 sats 94-96% on 2L [place]. Pt is frail with multiple abrasions on anterior tibias bilaterally, large R knee ecchymosis with tenderness and limited knee ROM due to pain.  RRR, lungs with diffuse expiratory wheezing throughout, rhonchi at lower lobes bilaterally and mildly decreased breath sound at L lower lobe.  U/A with small amounts of Hgb and ketones.  CMP remarkable for low Na, K, Cl, normal creatinine.  CBC with mild leukocytosis and anemia.  Troponin x 2 negative.  Pending EKG.  CT head and cervical spine without acute fractures or traumatic subluxation, chronic microvascular ischemia and no acute intracranial abnormalities.  CXR shows L basilar moderately sized pneumothorax and chronic emphysema.  Pt given morphine/zofran for pain, IVF bolus and infusion, duoneb x 1 in ED. Pt will likely need chest tube placed.  Consulted CT surgery at [facility] initially who recommended chest tube placement.  CT surgeon requested first consult [facility] pulmonologist to evaluate the patient and determine if they were able to place chest tube in [facility] and then admit to [facility], to avoid transfer to [facility].  [facility] pulmonologist (Dr.Jose Eddie Candle A de [place]) unable to place chest  tube here, he recommended CT chest w/o contrast to evaluate need for chest tube given advance COPD and high risk with chest tube insertion, may consider watch and wait as opposed to chest tube at this point.    Pending CT chest w/o contrast at end of my shift. Pt discussed with oncoming PA. Pulmonologist will reassess need for chest tube after CT chest results.  If pt needs CT she may be transferred to [facility] for CT guided chest placement by IR. If chest tube is not recommended by pulmonologist after CT chest, pt may be admitted to [facility] for observation/watch and wait approach of respiratory status/pneumothorax.   Final Clinical Impressions(s) / ED Diagnoses   Final diagnoses:  Shortness of breath    New Prescriptions New Prescriptions   No medications on file     Liberty Handy, PA-C 03/29/16 1632    Melene Plan, DO 03/30/16 [phone]

## 2016-03-30 ENCOUNTER — Inpatient Hospital Stay (HOSPITAL_COMMUNITY): Payer: Medicare Other

## 2016-03-30 DIAGNOSIS — Z9689 Presence of other specified functional implants: Secondary | ICD-10-CM | POA: Diagnosis present

## 2016-03-30 DIAGNOSIS — S270XXA Traumatic pneumothorax, initial encounter: Principal | ICD-10-CM

## 2016-03-30 DIAGNOSIS — J441 Chronic obstructive pulmonary disease with (acute) exacerbation: Secondary | ICD-10-CM

## 2016-03-30 LAB — PROCALCITONIN

## 2016-03-30 LAB — CBC
HCT: 31.3 % — ABNORMAL LOW (ref 36.0–46.0)
HEMOGLOBIN: 9.9 g/dL — AB (ref 12.0–15.0)
MCH: 30.2 pg (ref 26.0–34.0)
MCHC: 31.6 g/dL (ref 30.0–36.0)
MCV: 95.4 fL (ref 78.0–100.0)
Platelets: 268 10*3/uL (ref 150–400)
RBC: 3.28 MIL/uL — AB (ref 3.87–5.11)
RDW: 12.7 % (ref 11.5–15.5)
WBC: 13.3 10*3/uL — AB (ref 4.0–10.5)

## 2016-03-30 LAB — COMPREHENSIVE METABOLIC PANEL
ALT: 19 U/L (ref 14–54)
ANION GAP: 7 (ref 5–15)
AST: 26 U/L (ref 15–41)
Albumin: 3.4 g/dL — ABNORMAL LOW (ref 3.5–5.0)
Alkaline Phosphatase: 100 U/L (ref 38–126)
BUN: 12 mg/dL (ref 6–20)
CHLORIDE: 100 mmol/L — AB (ref 101–111)
CO2: 31 mmol/L (ref 22–32)
Calcium: 8.4 mg/dL — ABNORMAL LOW (ref 8.9–10.3)
Creatinine, Ser: 0.47 mg/dL (ref 0.44–1.00)
GFR calc non Af Amer: >60 mL/min (ref >60)
Glucose, Bld: 134 mg/dL — ABNORMAL HIGH (ref 65–99)
Potassium: 3.2 mmol/L — ABNORMAL LOW (ref 3.5–5.1)
SODIUM: 138 mmol/L (ref 135–145)
Total Bilirubin: 0.8 mg/dL (ref 0.3–1.2)
Total Protein: 6.2 g/dL — ABNORMAL LOW (ref 6.5–8.1)

## 2016-03-30 LAB — TSH: TSH: 0.415 u[IU]/mL (ref 0.350–4.500)

## 2016-03-30 LAB — PHOSPHORUS: PHOSPHORUS: 2.8 mg/dL (ref 2.5–4.6)

## 2016-03-30 LAB — MRSA PCR SCREENING: MRSA by PCR: NEGATIVE

## 2016-03-30 LAB — MAGNESIUM: MAGNESIUM: 1.8 mg/dL (ref 1.7–2.4)

## 2016-03-30 LAB — STREP PNEUMONIAE URINARY ANTIGEN: STREP PNEUMO URINARY ANTIGEN: NEGATIVE

## 2016-03-30 MED ORDER — ADULT MULTIVITAMIN W/MINERALS CH
1.0000 | ORAL_TABLET | Freq: Every day | ORAL | Status: DC
  Administered 2016-03-30 – 2016-04-12 (×13): 1 via ORAL
  Filled 2016-03-30 (×13): qty 1

## 2016-03-30 MED ORDER — BOOST / RESOURCE BREEZE PO LIQD
1.0000 | Freq: Three times a day (TID) | ORAL | Status: DC
  Administered 2016-03-30 – 2016-04-12 (×28): 1 via ORAL

## 2016-03-30 MED ORDER — ORAL CARE MOUTH RINSE
15.0000 mL | Freq: Two times a day (BID) | OROMUCOSAL | Status: DC
  Administered 2016-03-30 – 2016-04-12 (×20): 15 mL via OROMUCOSAL

## 2016-03-30 MED ORDER — METHYLPREDNISOLONE SODIUM SUCC 40 MG IJ SOLR
20.0000 mg | Freq: Two times a day (BID) | INTRAMUSCULAR | Status: DC
  Administered 2016-03-30 – 2016-04-01 (×4): 20 mg via INTRAVENOUS
  Filled 2016-03-30 (×4): qty 1

## 2016-03-30 MED ORDER — POTASSIUM CHLORIDE CRYS ER 20 MEQ PO TBCR
40.0000 meq | EXTENDED_RELEASE_TABLET | Freq: Once | ORAL | Status: AC
  Administered 2016-03-30: 40 meq via ORAL
  Filled 2016-03-30: qty 2

## 2016-03-30 NOTE — Progress Notes (Signed)
[address]       [name] [address]             [phone]                     LOS: 1 day   Subjective: Chest tube placed last night  Objective: Vital signs in last 24 hours: Patient Vitals for the past 24 hrs:  BP Temp Temp src Pulse Resp SpO2 Height Weight  03/30/16 0747 - 97.5 F (36.4 C) - - - - - -  03/30/16 0735 - - - - - 100 % - -  03/30/16 0700 (!) 143/60 - - 87 19 100 % - -  03/30/16 0600 139/60 - - 85 (!) 21 100 % - -  03/30/16 0500 (!) 150/69 - - 79 18 100 % - -  03/30/16 0407 - 97.7 F (36.5 C) Oral - - - - -  03/30/16 0300 140/62 - - 78 19 100 % - -  03/30/16 0100 (!) 145/66 - - 83 20 99 % - -  03/29/16 2311 - - - - - - 4' 11.84" (1.52 m) 100 lb 1.4 oz (45.4 kg)  03/29/16 2305 - 98.2 F (36.8 C) Axillary - - - - -  03/29/16 2100 129/72 - - 81 17 98 % - -  03/29/16 2030 91/79 - - 87 22 94 % - -  03/29/16 2000 136/63 - - 93 (!) 27 100 % - -  03/29/16 1950 - - - - - 98 % - -  03/29/16 1947 - - - - - 100 % - -  03/29/16 1946 144/60 - - 96 (!) 34 100 % - -  03/29/16 1900 132/73 - - 100 21 100 % - -  03/29/16 1830 140/65 - - 101 21 100 % - -  03/29/16 1806 125/83 - - 92 18 100 % - -  03/29/16 1730 139/67 - - 93 19 100 % - -  03/29/16 1721 141/68 - - 98 17 98 % - -  03/29/16 1630 154/82 - - 101 18 100 % - -  03/29/16 1600 137/77 - - 93 19 100 % - -  03/29/16 1530 164/76 - - 99 15 100 % - -  03/29/16 1447 149/73 - - 96 22 96 % - -  03/29/16 1227 157/95 - - 98 19 94 % - -  03/29/16 1014 148/83 98 F (36.7 C) Oral 98 15 100 % - -    Filed Weights   03/29/16 2311  Weight: 100 lb 1.4 oz (45.4 kg)    Hemodynamic parameters for last 24 hours:    Intake/Output from previous day: 12/04 0701 - 12/05 0700 In: 1030 [I.V.:280; IV Piggyback:750] Out: -  Intake/Output this shift: No intake/output data recorded.  Scheduled Meds: . aztreonam  2 g Intravenous Q8H  . budesonide (PULMICORT) nebulizer solution  0.5 mg Nebulization BID  .  buPROPion  150 mg Oral Daily  . escitalopram  10 mg Oral Daily  . guaiFENesin  600 mg Oral BID  . ipratropium-albuterol  3 mL Nebulization QID  . levothyroxine  50 mcg Oral Daily  . mouth rinse  15 mL Mouth Rinse BID  . methylPREDNISolone (SOLU-MEDROL) injection  40 mg Intravenous Q12H  . sodium chloride flush  3 mL Intravenous Q12H  . vancomycin  1,000 mg Intravenous Q24H   Continuous Infusions: . sodium chloride 75 mL/hr at 03/30/16 0400   PRN  Meds:.acetaminophen **OR** acetaminophen, HYDROcodone-acetaminophen, ipratropium-albuterol, ondansetron **OR** ondansetron (ZOFRAN) IV    Lab Results: CBC: Recent Labs  03/29/16 1211 03/30/16 0339  WBC 14.7* 13.3*  HGB 10.3* 9.9*  HCT 31.4* 31.3*  PLT 274 268   BMET:  Recent Labs  03/29/16 1211 03/30/16 0339  NA 133* 138  K 3.1* 3.2*  CL 92* 100*  CO2 32 31  GLUCOSE 106* 134*  BUN 7 12  CREATININE 0.46 0.47  CALCIUM 8.8* 8.4*    PT/INR: No results for input(s): LABPROT, INR in the last 72 hours.   Radiology Dg Chest 2 View  Result Date: 03/29/2016 CLINICAL DATA:  Fall. Recent humerus fracture. Cough. Congestion. COPD/asthma. EXAM: CHEST  2 VIEW COMPARISON:  02/10/2016 FINDINGS: Moderately degraded lateral view, secondary to patient positioning, including arms not raised above the head. Mid thoracic compression deformity is moderate and similar. Midline trachea. Normal heart size. Atherosclerosis in the transverse aorta. No pleural fluid. Moderate left-sided pneumothorax, primarily identified inferiorly and laterally. On the order of 40%. Underlying COPD/chronic bronchitis, as evidenced by interstitial thickening and hyperinflation. IMPRESSION: Moderate size left-sided pneumothorax. Aortic atherosclerosis. Critical test results telephoned to dr. Adela Lank. at the time of interpretation at 11:56 a.m.on 03/29/2016. Electronically Signed   By: Jeronimo Greaves M.D.   On: 03/29/2016 11:56   Ct Head Wo Contrast  Result Date:  03/29/2016 CLINICAL DATA:  Recent falls.  Right arm fracture. EXAM: CT HEAD WITHOUT CONTRAST CT CERVICAL SPINE WITHOUT CONTRAST TECHNIQUE: Multidetector CT imaging of the head and cervical spine was performed following the standard protocol without intravenous contrast. Multiplanar CT image reconstructions of the cervical spine were also generated. COMPARISON:  Cervical spine myelogram 05/07/2010 FINDINGS: CT HEAD FINDINGS Brain: Mild generalized atrophy and white matter disease is somewhat advanced for age no acute infarct, hemorrhage, or mass lesion is present. The ventricles are proportionate to the degree of atrophy. No significant extra-axial fluid collection is present. Vascular: Atherosclerotic calcifications are present within the cavernous internal carotid arteries bilaterally. There is no hyperdense vessel. Skull: The calvarium is intact. No acute fractures are present. No focal lytic or blastic lesions are present. No significant extracranial soft tissue injury is present. Sinuses/Orbits: The paranasal sinuses and mastoid air cells are clear. Bilateral lens replacements are present. The globes and orbits are otherwise intact. CT CERVICAL SPINE FINDINGS Alignment: Slight retrolisthesis at C3-4 at C4-5 is stable. AP alignment is otherwise anatomic. Skull base and vertebrae: The craniocervical junction is within normal limits. Soft tissues and spinal canal: Dense atherosclerotic calcifications are present at the carotid bifurcations bilaterally with possible stenosis on the left. The soft tissues the neck are otherwise unremarkable. The spinal canal is grossly patent. Disc levels: Multilevel stenosis of the cervical spine is again noted. The most severe level is at C4-5 with moderate central and bilateral foraminal stenosis. Upper chest: A prominent left pneumothorax is noted. This was called earlier today. Severe centrilobular emphysema is noted. IMPRESSION: 1. Persistent left-sided pneumothorax. 2.  Emphysema. 3. Multilevel spondylosis of the cervical spine without acute fracture or traumatic subluxation. 4. Moderate atrophy and white matter disease likely reflects the sequela of chronic microvascular ischemia. 5. No acute intracranial abnormality. These results will be called to the ordering clinician or representative by the Radiologist Assistant, and communication documented in the PACS or zVision Dashboard. Electronically Signed   By: Marin Roberts M.D.   On: 03/29/2016 14:23   Ct Chest Wo Contrast  Result Date: 03/29/2016 CLINICAL DATA:  Chest tightness and worsening cough  in patient with history of COPD. Pneumothorax by plain films earlier today. EXAM: CT CHEST WITHOUT CONTRAST TECHNIQUE: Multidetector CT imaging of the chest was performed following the standard protocol without IV contrast. COMPARISON:  PA and lateral chest this same day and 07/31/2015. CT chest 11/20/2012. FINDINGS: Cardiovascular: Calcific aortic and coronary atherosclerosis is identified. No pericardial effusion. Heart size is normal. Mediastinum/Nodes: No enlarged mediastinal or axillary lymph nodes. Thyroid gland, trachea and esophagus demonstrate no significant findings. Lungs/Pleura: The patient has a known left hydropneumothorax with only a small amount of pleural fluid. Pneumothorax estimated at 70%. The lungs demonstrate extensive emphysematous disease. There is some atelectasis bilaterally, worse on the left. Upper Abdomen: The patient is status post cholecystectomy. No acute abnormality is identified. Musculoskeletal: T7 and T8 compression fracture appears are identified as seen 07/31/2015 exam. No worrisome lesion is identified. IMPRESSION: Left hydropneumothorax. There is only a small amount of pleural fluid. Pneumothorax is estimated at 70%. Severe emphysema. Atherosclerosis. Critical Value/emergent results were called by telephone at the time of interpretation on 03/29/2016 at 5:25 pm to [facility] MEYER, P.A., who  verbally acknowledged these results. Electronically Signed   By: Drusilla Kannerhomas  Dalessio M.D.   On: 03/29/2016 17:27   Ct Cervical Spine Wo Contrast  Result Date: 03/29/2016 CLINICAL DATA:  Recent falls.  Right arm fracture. EXAM: CT HEAD WITHOUT CONTRAST CT CERVICAL SPINE WITHOUT CONTRAST TECHNIQUE: Multidetector CT imaging of the head and cervical spine was performed following the standard protocol without intravenous contrast. Multiplanar CT image reconstructions of the cervical spine were also generated. COMPARISON:  Cervical spine myelogram 05/07/2010 FINDINGS: CT HEAD FINDINGS Brain: Mild generalized atrophy and white matter disease is somewhat advanced for age no acute infarct, hemorrhage, or mass lesion is present. The ventricles are proportionate to the degree of atrophy. No significant extra-axial fluid collection is present. Vascular: Atherosclerotic calcifications are present within the cavernous internal carotid arteries bilaterally. There is no hyperdense vessel. Skull: The calvarium is intact. No acute fractures are present. No focal lytic or blastic lesions are present. No significant extracranial soft tissue injury is present. Sinuses/Orbits: The paranasal sinuses and mastoid air cells are clear. Bilateral lens replacements are present. The globes and orbits are otherwise intact. CT CERVICAL SPINE FINDINGS Alignment: Slight retrolisthesis at C3-4 at C4-5 is stable. AP alignment is otherwise anatomic. Skull base and vertebrae: The craniocervical junction is within normal limits. Soft tissues and spinal canal: Dense atherosclerotic calcifications are present at the carotid bifurcations bilaterally with possible stenosis on the left. The soft tissues the neck are otherwise unremarkable. The spinal canal is grossly patent. Disc levels: Multilevel stenosis of the cervical spine is again noted. The most severe level is at C4-5 with moderate central and bilateral foraminal stenosis. Upper chest: A prominent  left pneumothorax is noted. This was called earlier today. Severe centrilobular emphysema is noted. IMPRESSION: 1. Persistent left-sided pneumothorax. 2. Emphysema. 3. Multilevel spondylosis of the cervical spine without acute fracture or traumatic subluxation. 4. Moderate atrophy and white matter disease likely reflects the sequela of chronic microvascular ischemia. 5. No acute intracranial abnormality. These results will be called to the ordering clinician or representative by the Radiologist Assistant, and communication documented in the PACS or zVision Dashboard. Electronically Signed   By: Marin Robertshristopher  Mattern M.D.   On: 03/29/2016 14:23   Dg Chest Port 1 View  Result Date: 03/30/2016 CLINICAL DATA:  Left pneumothorax. COPD. Respiratory failure with hypercapnia. EXAM: PORTABLE CHEST 1 VIEW COMPARISON:  03/29/2016 FINDINGS: Left chest tube remains  in place. No residual pneumothorax visualized. Decreased atelectasis or infiltrate is seen in the left retrocardiac lung base. Right lung is clear. Heart size is normal. IMPRESSION: Decreased atelectasis or infiltrate in the left track lung base. No residual pneumothorax visualized. Electronically Signed   By: Myles RosenthalJohn  Stahl M.D.   On: 03/30/2016 07:18   Dg Chest Portable 1 View  Result Date: 03/29/2016 CLINICAL DATA:  Status post left chest tube placement today for pneumothorax. EXAM: PORTABLE CHEST 1 VIEW COMPARISON:  CT chest and PA and lateral chest earlier today. FINDINGS: New left chest tube is in place. Left pneumothorax is nearly completely resolved with only a very small apical component remaining. Lungs are emphysematous. IMPRESSION: Near complete resolution of left pneumothorax after chest tube placement. Emphysema. Electronically Signed   By: Drusilla Kannerhomas  Dalessio M.D.   On: 03/29/2016 20:07   Dg Knee Complete 4 Views Right  Result Date: 03/29/2016 CLINICAL DATA:  Fall EXAM: RIGHT KNEE - COMPLETE 4+ VIEW COMPARISON:  None. FINDINGS: Four views of the  right knee submitted. No acute fracture or subluxation. There is mild narrowing of medial joint compartment. Chondrocalcinosis is noted. Mild narrowing of patellofemoral joint space. IMPRESSION: No acute fracture or subluxation. Chondrocalcinosis. Osteoarthritic changes. Electronically Signed   By: Natasha MeadLiviu  Pop M.D.   On: 03/29/2016 17:26     Assessment/Plan: Lung inflated with chest tube - maintain suction until no air leak  Please consult Pulmonary to management chest tube on site- [facility] basis   Delight OvensEdward B Loring Liskey MD 03/30/2016 8:18 AM     Patient ID: [place] [name], female   DOB: 1946-02-14, 70 y.o.   MRN: [id number]

## 2016-03-30 NOTE — Progress Notes (Signed)
PT Cancellation Note  Patient Details Name: [place] [name] MRN: [id number] DOB: 07-Jan-1946   Cancelled Treatment:    Reason Eval/Treat Not Completed: Patient not medically ready (RN to check on CYT /if OK to remove from wall suvtion for mobility. patient is currently very lethargic.)   Rada HayHill, Willie Plain [name] 03/30/2016, 10:14 AM

## 2016-03-30 NOTE — Progress Notes (Signed)
OT Cancellation Note  Patient Details Name: [place] [name] MRN: [id number] DOB: January 11, 1946   Cancelled Treatment:    Reason Eval/Treat Not Completed: Other (comment). Pt sleepy this am.  RN to clarify if CT can be removed from suction and husband plans to bring standard sling in addition to her humeral brace. Will check back.  Charolett Yarrow 03/30/2016, 9:29 AM  [name], OTR/L [phone] 03/30/2016

## 2016-03-30 NOTE — Progress Notes (Signed)
PROGRESS NOTE  Cheyenne MassedRevauda S Couser LKG:[id number] DOB: [birth year] DOA: 03/29/2016 PCP: Gaye AlkenBARNES,[name], MD  HPI/Recap of past 24 hours: Patient is a 70 year old female past medical history of alcohol abuse, hypothyroidism and chronic respiratory failure with hypoxia secondary to COPD on 3 L nasal cannula at home who for the past month has had several major falls at home which led to multiple fractures. Patient presented to the emergency room on 12/4 with worsening cough, shortness of breath for 2 days after she had a fall where she fell and fractured her right arm and then had a fall again the night prior injuring her right knee. In the emergency room, chest x-ray noted a left basilar moderately sized pneumothorax along with her chronic emphysema.  He was placed on 100% nonrebreather mask which didn't help her oxygenation. Cardiothoracic surgery consulted who placed a chest tube. Pulmonary consulted. Patient admitted to the hospitalist service in the stepdown unit for COPD exacerbation with possible healthcare associated pneumonia worsened from pneumothorax.  This morning, patient is resting comfortably. Feels like her breathing is better. She is currently on 6 L nasal cannula.  Assessment/Plan: Active Problems:   Alcohol abuse   Acute on chronic respiratory failure with hypercapnia and hypoxia secondary to COPD exacerbation, healthcare associated pneumonia and left-sided basilar pneumothorax: Appreciate cardiothoracic surgery and pulmonary help. Continue chest tube. Given severe COPD, agree she will likely have this tube for a while. Daily chest x-rays. Continue IV steroids and nebulizers. Trending down oxygen  Frequent falls: Some of this may be hypoxia, however may also be from her chronic pain and anxiety medications. We'll continue, monitoring for somnolence and see how she does with physical therapy. She likely will need short-term skilled nursing. Respiratory failure with hypercapnia  (HCC)  Severe protein calorie malnutrition: Have asked nutrition to see for assessments and recommendations.  Hypothyroidism: Continue Synthroid  Hypokalemia: We'll replace, check a level.   Code Status: DO NOT RESUSCITATE   Family Communication: Left message for husband   Disposition Plan: Continue in stepdown for at least one more day. If pneumothorax is stable, could transfer up to floor.    Consultants:  Cardiothoracic surgery  Pulmonary/critical care   Procedures:  12/4: Chest tube placement   Antimicrobials:  IV Azactam and vancomycin 12/4-present   DVT prophylaxis:  SCDs   Objective: Vitals:   03/30/16 0800 03/30/16 0937 03/30/16 1000 03/30/16 1201  BP: (!) 151/58  (!) 160/60 (!) 149/66  Pulse: 85  88 (!) 105  Resp: (!) 30  (!) 25 (!) 21  Temp:    97.7 F (36.5 C)  TempSrc:      SpO2: 100% 100% 100% 99%  Weight:      Height:        Intake/Output Summary (Last 24 hours) at 03/30/16 1209 Last data filed at 03/30/16 1000  Gross per 24 hour  Intake             1500 ml  Output                0 ml  Net             1500 ml   Filed Weights   03/29/16 2311  Weight: 45.4 kg (100 lb 1.4 oz)    Exam:   General:   Alert and oriented 3, no acute distress  Cardiovascular: Regular rate and rhythm, S1-S2   Respiratory: Chest tube in place, no wheezes. Basilar crackles  Abdomen:  soft, nontender, non-cynical positive bowel  sounds  Musculoskeletal: No clubbing or cyanosis or edema, her right arm is in a cast  Skin: no skin breaks, tears or lesions  Psychiatry:  patient is appropriate, no evidence of psychoses  Data Reviewed: CBC:  Recent Labs Lab 03/29/16 1211 03/30/16 0339  WBC 14.7* 13.3*  NEUTROABS 11.9*  --   HGB 10.3* 9.9*  HCT 31.4* 31.3*  MCV 92.6 95.4  PLT 274 268   Basic Metabolic Panel:  Recent Labs Lab 03/29/16 1211 03/30/16 0339  NA 133* 138  K 3.1* 3.2*  CL 92* 100*  CO2 32 31  GLUCOSE 106* 134*  BUN 7 12    CREATININE 0.46 0.47  CALCIUM 8.8* 8.4*  MG  --  1.8  PHOS  --  2.8   GFR: Estimated Creatinine Clearance: 47.3 mL/min (by C-G formula based on SCr of 0.47 mg/dL). Liver Function Tests:  Recent Labs Lab 03/29/16 1211 03/30/16 0339  AST 34 26  ALT 21 19  ALKPHOS 117 100  BILITOT 0.8 0.8  PROT 6.8 6.2*  ALBUMIN 3.9 3.4*   No results for input(s): LIPASE, AMYLASE in the last 168 hours. No results for input(s): AMMONIA in the last 168 hours. Coagulation Profile: No results for input(s): INR, PROTIME in the last 168 hours. Cardiac Enzymes: No results for input(s): CKTOTAL, CKMB, CKMBINDEX, TROPONINI in the last 168 hours. BNP (last 3 results) No results for input(s): PROBNP in the last 8760 hours. HbA1C: No results for input(s): HGBA1C in the last 72 hours. CBG: No results for input(s): GLUCAP in the last 168 hours. Lipid Profile: No results for input(s): CHOL, HDL, LDLCALC, TRIG, CHOLHDL, LDLDIRECT in the last 72 hours. Thyroid Function Tests:  Recent Labs  03/30/16 0339  TSH 0.415   Anemia Panel: No results for input(s): VITAMINB12, FOLATE, FERRITIN, TIBC, IRON, RETICCTPCT in the last 72 hours. Urine analysis:    Component Value Date/Time   COLORURINE YELLOW 03/29/2016 1211   APPEARANCEUR CLEAR 03/29/2016 1211   LABSPEC 1.008 03/29/2016 1211   PHURINE 6.5 03/29/2016 1211   GLUCOSEU NEGATIVE 03/29/2016 1211   HGBUR SMALL (A) 03/29/2016 1211   BILIRUBINUR NEGATIVE 03/29/2016 1211   KETONESUR 40 (A) 03/29/2016 1211   PROTEINUR NEGATIVE 03/29/2016 1211   UROBILINOGEN 0.2 12/12/2006 1153   NITRITE NEGATIVE 03/29/2016 1211   LEUKOCYTESUR NEGATIVE 03/29/2016 1211   Sepsis Labs: @LABRCNTIP (procalcitonin:4,lacticidven:4)  ) Recent Results (from the past 240 hour(s))  MRSA PCR Screening     Status: None   Collection Time: 03/30/16 12:22 AM  Result Value Ref Range Status   MRSA by PCR NEGATIVE NEGATIVE Final    Comment:        The GeneXpert MRSA Assay  (FDA approved for NASAL specimens only), is one component of a comprehensive MRSA colonization surveillance program. It is not intended to diagnose MRSA infection nor to guide or monitor treatment for MRSA infections.       Studies: Ct Head Wo Contrast  Result Date: 03/29/2016 CLINICAL DATA:  Recent falls.  Right arm fracture. EXAM: CT HEAD WITHOUT CONTRAST CT CERVICAL SPINE WITHOUT CONTRAST TECHNIQUE: Multidetector CT imaging of the head and cervical spine was performed following the standard protocol without intravenous contrast. Multiplanar CT image reconstructions of the cervical spine were also generated. COMPARISON:  Cervical spine myelogram 05/07/2010 FINDINGS: CT HEAD FINDINGS Brain: Mild generalized atrophy and white matter disease is somewhat advanced for age no acute infarct, hemorrhage, or mass lesion is present. The ventricles are proportionate to the degree of atrophy. No  significant extra-axial fluid collection is present. Vascular: Atherosclerotic calcifications are present within the cavernous internal carotid arteries bilaterally. There is no hyperdense vessel. Skull: The calvarium is intact. No acute fractures are present. No focal lytic or blastic lesions are present. No significant extracranial soft tissue injury is present. Sinuses/Orbits: The paranasal sinuses and mastoid air cells are clear. Bilateral lens replacements are present. The globes and orbits are otherwise intact. CT CERVICAL SPINE FINDINGS Alignment: Slight retrolisthesis at C3-4 at C4-5 is stable. AP alignment is otherwise anatomic. Skull base and vertebrae: The craniocervical junction is within normal limits. Soft tissues and spinal canal: Dense atherosclerotic calcifications are present at the carotid bifurcations bilaterally with possible stenosis on the left. The soft tissues the neck are otherwise unremarkable. The spinal canal is grossly patent. Disc levels: Multilevel stenosis of the cervical spine is again  noted. The most severe level is at C4-5 with moderate central and bilateral foraminal stenosis. Upper chest: A prominent left pneumothorax is noted. This was called earlier today. Severe centrilobular emphysema is noted. IMPRESSION: 1. Persistent left-sided pneumothorax. 2. Emphysema. 3. Multilevel spondylosis of the cervical spine without acute fracture or traumatic subluxation. 4. Moderate atrophy and white matter disease likely reflects the sequela of chronic microvascular ischemia. 5. No acute intracranial abnormality. These results will be called to the ordering clinician or representative by the Radiologist Assistant, and communication documented in the PACS or zVision Dashboard. Electronically Signed   By: Marin Robertshristopher  Mattern M.D.   On: 03/29/2016 14:23   Ct Chest Wo Contrast  Result Date: 03/29/2016 CLINICAL DATA:  Chest tightness and worsening cough in patient with history of COPD. Pneumothorax by plain films earlier today. EXAM: CT CHEST WITHOUT CONTRAST TECHNIQUE: Multidetector CT imaging of the chest was performed following the standard protocol without IV contrast. COMPARISON:  PA and lateral chest this same day and 07/31/2015. CT chest 11/20/2012. FINDINGS: Cardiovascular: Calcific aortic and coronary atherosclerosis is identified. No pericardial effusion. Heart size is normal. Mediastinum/Nodes: No enlarged mediastinal or axillary lymph nodes. Thyroid gland, trachea and esophagus demonstrate no significant findings. Lungs/Pleura: The patient has a known left hydropneumothorax with only a small amount of pleural fluid. Pneumothorax estimated at 70%. The lungs demonstrate extensive emphysematous disease. There is some atelectasis bilaterally, worse on the left. Upper Abdomen: The patient is status post cholecystectomy. No acute abnormality is identified. Musculoskeletal: T7 and T8 compression fracture appears are identified as seen 07/31/2015 exam. No worrisome lesion is identified. IMPRESSION:  Left hydropneumothorax. There is only a small amount of pleural fluid. Pneumothorax is estimated at 70%. Severe emphysema. Atherosclerosis. Critical Value/emergent results were called by telephone at the time of interpretation on 03/29/2016 at 5:25 pm to [facility]ER, P[facility] who verbally acknowledged these results. Electronically Signed   By: Drusilla Kannerhomas  Dalessio M.D.   On: 03/29/2016 17:27   Ct Cervical Spine Wo Contrast  Result Date: 03/29/2016 CLINICAL DATA:  Recent falls.  Right arm fracture. EXAM: CT HEAD WITHOUT CONTRAST CT CERVICAL SPINE WITHOUT CONTRAST TECHNIQUE: Multidetector CT imaging of the head and cervical spine was performed following the standard protocol without intravenous contrast. Multiplanar CT image reconstructions of the cervical spine were also generated. COMPARISON:  Cervical spine myelogram 05/07/2010 FINDINGS: CT HEAD FINDINGS Brain: Mild generalized atrophy and white matter disease is somewhat advanced for age no acute infarct, hemorrhage, or mass lesion is present. The ventricles are proportionate to the degree of atrophy. No significant extra-axial fluid collection is present. Vascular: Atherosclerotic calcifications are present within the cavernous internal carotid  arteries bilaterally. There is no hyperdense vessel. Skull: The calvarium is intact. No acute fractures are present. No focal lytic or blastic lesions are present. No significant extracranial soft tissue injury is present. Sinuses/Orbits: The paranasal sinuses and mastoid air cells are clear. Bilateral lens replacements are present. The globes and orbits are otherwise intact. CT CERVICAL SPINE FINDINGS Alignment: Slight retrolisthesis at C3-4 at C4-5 is stable. AP alignment is otherwise anatomic. Skull base and vertebrae: The craniocervical junction is within normal limits. Soft tissues and spinal canal: Dense atherosclerotic calcifications are present at the carotid bifurcations bilaterally with possible stenosis on the  left. The soft tissues the neck are otherwise unremarkable. The spinal canal is grossly patent. Disc levels: Multilevel stenosis of the cervical spine is again noted. The most severe level is at C4-5 with moderate central and bilateral foraminal stenosis. Upper chest: A prominent left pneumothorax is noted. This was called earlier today. Severe centrilobular emphysema is noted. IMPRESSION: 1. Persistent left-sided pneumothorax. 2. Emphysema. 3. Multilevel spondylosis of the cervical spine without acute fracture or traumatic subluxation. 4. Moderate atrophy and white matter disease likely reflects the sequela of chronic microvascular ischemia. 5. No acute intracranial abnormality. These results will be called to the ordering clinician or representative by the Radiologist Assistant, and communication documented in the PACS or zVision Dashboard. Electronically Signed   By: Marin Roberts M.D.   On: 03/29/2016 14:23   Dg Chest Port 1 View  Result Date: 03/30/2016 CLINICAL DATA:  Left pneumothorax. COPD. Respiratory failure with hypercapnia. EXAM: PORTABLE CHEST 1 VIEW COMPARISON:  03/29/2016 FINDINGS: Left chest tube remains in place. No residual pneumothorax visualized. Decreased atelectasis or infiltrate is seen in the left retrocardiac lung base. Right lung is clear. Heart size is normal. IMPRESSION: Decreased atelectasis or infiltrate in the left track lung base. No residual pneumothorax visualized. Electronically Signed   By: Myles Rosenthal M.D.   On: 03/30/2016 07:18   Dg Chest Portable 1 View  Result Date: 03/29/2016 CLINICAL DATA:  Status post left chest tube placement today for pneumothorax. EXAM: PORTABLE CHEST 1 VIEW COMPARISON:  CT chest and PA and lateral chest earlier today. FINDINGS: New left chest tube is in place. Left pneumothorax is nearly completely resolved with only a very small apical component remaining. Lungs are emphysematous. IMPRESSION: Near complete resolution of left pneumothorax  after chest tube placement. Emphysema. Electronically Signed   By: Drusilla Kanner M.D.   On: 03/29/2016 20:07   Dg Knee Complete 4 Views Right  Result Date: 03/29/2016 CLINICAL DATA:  Fall EXAM: RIGHT KNEE - COMPLETE 4+ VIEW COMPARISON:  None. FINDINGS: Four views of the right knee submitted. No acute fracture or subluxation. There is mild narrowing of medial joint compartment. Chondrocalcinosis is noted. Mild narrowing of patellofemoral joint space. IMPRESSION: No acute fracture or subluxation. Chondrocalcinosis. Osteoarthritic changes. Electronically Signed   By: Natasha Mead M.D.   On: 03/29/2016 17:26    Scheduled Meds: . aztreonam  2 g Intravenous Q8H  . budesonide (PULMICORT) nebulizer solution  0.5 mg Nebulization BID  . buPROPion  150 mg Oral Daily  . escitalopram  10 mg Oral Daily  . guaiFENesin  600 mg Oral BID  . ipratropium-albuterol  3 mL Nebulization QID  . levothyroxine  50 mcg Oral Daily  . mouth rinse  15 mL Mouth Rinse BID  . methylPREDNISolone (SOLU-MEDROL) injection  20 mg Intravenous Q12H  . sodium chloride flush  3 mL Intravenous Q12H  . vancomycin  1,000 mg Intravenous  Q24H    Continuous Infusions:   LOS: 1 day    Hollice Espy, MD [facility] Pager [phone]  If 7PM-7AM, please contact night-coverage [email or web address] Password [email or web address] 03/30/2016, 12:09 PM

## 2016-03-30 NOTE — Progress Notes (Signed)
Initial Nutrition Assessment  DOCUMENTATION CODES:   Not applicable  INTERVENTION:  - Will order Boost Breeze TID, each supplement provides 250 kcal and 9 grams of protein - Will order daily multivitamin with minerals.  - Continue to encourage PO intakes of meals and supplements. - RD will continue to monitor for additional nutrition-related needs.  NUTRITION DIAGNOSIS:   Increased nutrient needs related to chronic illness (COPD) as evidenced by estimated needs (for protein).  GOAL:   Patient will meet greater than or equal to 90% of their needs  MONITOR:   PO intake, Supplement acceptance, Weight trends, Labs, I & O's  REASON FOR ASSESSMENT:   Consult Assessment of nutrition requirement/status  ASSESSMENT:    70 y.o. female with medical history significant of COPD  chronic hypoxemic respiratory failure on home oxygen 3 L, former smoker, former EtOH abuse, hx hypothroidism, DJD, anxiety, recurrent falls. Pt admitted after fall at home.  Pt seen for consult. BMI indicates normal weight. Pt was NPO until around noon when diet was advanced and she is now on Regular diet. Pt reports she ate a small amount of pinto beans, collard greens, and mashed potatoes for lunch. PT and OT had worked with pt right before RD visit.  She states that she was dx with COPD and placed on O2 in 2011. In 2014 she was working and taking care of her mother who was ill and states that she began to quickly decline around that time. She states that she stopped working in the spring of 2016 and weight has been stable since that time (confirmed by weight hx available in pt's chart showing stable weight since 08/22/14). Pt states that she has had a poor appetite for a long time but is unsure when this began. She states that COPD causes severe SOB with eating at times and also decreases appetite because of difficulty with breathing that often occurs during meal times. She confirms that liquids are easier than solid  foods most of the time.   She has tried oral nutrition supplements such as Ensure and Boost in the past but states that they have caused diarrhea for her. Talked with her about Boost Breeze as she has not tried a clear liquid supplement; pt interested in trying this during hospitalization.  Unable to perform physical assessment during this visit and pt desired to rest after oral nutrition supplements had been discussed. Will complete physical assessment at follow-up and document findings at that time. Unable to state malnutrition without performing this.  Medications reviewed; 50 mcg oral Synthroid/day, 20 mg IV Solu-medrol BID, PRN Zofran, 40 mEq oral KCl x1 dose today. Labs reviewed; K: 3.2 mmol/L, Cl: 100 mmol/L, Ca: 8.4 mg/dL.   Diet Order:  Diet regular Room service appropriate? Yes; Fluid consistency: Thin  Skin:  Reviewed, no issues  Last BM:  PTA/unknown  Height:   Ht Readings from Last 1 Encounters:  03/29/16 4' 11.84" (1.52 m)    Weight:   Wt Readings from Last 1 Encounters:  03/29/16 100 lb 1.4 oz (45.4 kg)    Ideal Body Weight:  45.45 kg  BMI:  Body mass index is 19.65 kg/m.  Estimated Nutritional Needs:   Kcal:  1135-1360 (25-30 kcal/kg)  Protein:  60-70 grams  Fluid:  >/= 1.3 L/day  EDUCATION NEEDS:   No education needs identified at this time    Trenton GammonJessica Adajah Cocking, MS, RD, LDN, CNSC Inpatient Clinical Dietitian Pager # [phone] After hours/weekend pager # [phone]

## 2016-03-30 NOTE — Evaluation (Signed)
Occupational Therapy Evaluation Patient Details Name: Cheyenne [name] MRN: [id number] DOB: [birth year] Today's Date: 03/30/2016    History of Present Illness Pt was admitted for ETOH.  She has had recurrent falls and has a h/o COPD with 02 dependence.     Clinical Impression   This 70 year old female was admitted for the above. She will benefit from continued OT to increase safety and independence with adls. Goals are for min A overall.      Follow Up Recommendations  Home health OT;Supervision/Assistance - 24 hour (vs snf, if 24/7 not available)    Equipment Recommendations  None recommended by OT    Recommendations for Other Services       Precautions / Restrictions Precautions Precautions: Fall Required Braces or Orthoses: Sling (humeral at all times) Restrictions RUE Weight Bearing: Weight bearing as tolerated      Mobility Bed Mobility Overal bed mobility: Needs Assistance Bed Mobility: Supine to Sit;Sit to Supine     Supine to sit: Mod assist Sit to supine: Mod assist   General bed mobility comments: assist for trunk and to control descent  Transfers Overall transfer level: Needs assistance Equipment used: 2 person hand held assist Transfers: Sit to/from Stand;Stand Pivot Transfers Sit to Stand: Min assist;+2 safety/equipment Stand pivot transfers: Min assist;+2 safety/equipment       General transfer comment: pt has chest tube    Balance                                            ADL Overall ADL's : Needs assistance/impaired     Grooming: Wash/dry face;Set up;Sitting   Upper Body Bathing: Moderate assistance;Sitting   Lower Body Bathing: Maximal assistance;+2 for safety/equipment;Sit to/from stand   Upper Body Dressing : Moderate assistance;Sitting   Lower Body Dressing: Maximal assistance;Sit to/from stand;+2 for safety/equipment   Toilet Transfer: Minimal assistance;+2 for safety/equipment;BSC;RW   Toileting-  Clothing Manipulation and Hygiene: Set up;Sitting/lateral lean         General ADL Comments: pt has needed assistance for adls since humerus fx. She needs more assistance for LB due to chest tube placement and discomfort     Vision     Perception     Praxis      Pertinent Vitals/Pain Pain Assessment: Faces Faces Pain Scale: Hurts little more Pain Location: side (has chest tube, L) Pain Descriptors / Indicators: Aching Pain Intervention(s): Limited activity within patient's tolerance;Monitored during session;Premedicated before session;Repositioned     Hand Dominance     Extremity/Trunk Assessment Upper Extremity Assessment Upper Extremity Assessment: RUE deficits/detail (fx R humerus.  hand/wrist wfls)           Communication Communication Communication: No difficulties   Cognition Arousal/Alertness: Awake/alert Behavior During Therapy: WFL for tasks assessed/performed Overall Cognitive Status: Within Functional Limits for tasks assessed                     General Comments       Exercises       Shoulder Instructions      Home Living Family/patient expects to be discharged to:: Unsure Living Arrangements: Spouse/significant other                               Additional Comments: will need 24/7.  Has tub and stool  as well as 3:1      Prior Functioning/Environment Level of Independence: Needs assistance        Comments: has had aide 2x week.  Has needed help for adls since humeral fx        OT Problem List: Decreased strength;Decreased activity tolerance;Impaired balance (sitting and/or standing);Decreased knowledge of use of DME or AE;Pain;Impaired UE functional use   OT Treatment/Interventions: Self-care/ADL training;DME and/or AE instruction;Patient/family education;Balance training;Therapeutic activities    OT Goals(Current goals can be found in the care plan section) Acute Rehab OT Goals Patient Stated Goal: none stated;  agreeable to therapy OT Goal Formulation: With patient Time For Goal Achievement: 04/13/16 Potential to Achieve Goals: Good ADL Goals Pt Will Perform Grooming: with min guard assist;standing Pt Will Perform Lower Body Bathing: with min assist;with adaptive equipment;sit to/from stand Pt Will Transfer to Toilet: with min assist;ambulating;bedside commode Additional ADL Goal #1: pt will perform bed mobility with min A in preparation for adls  OT Frequency: Min 2X/week   Barriers to D/C:            Co-evaluation PT/OT/SLP Co-Evaluation/Treatment: Yes Reason for Co-Treatment: For patient/therapist safety PT goals addressed during session: Mobility/safety with mobility OT goals addressed during session: ADL's and self-care      End of Session    Activity Tolerance: Patient tolerated treatment well Patient left: in bed;with call bell/phone within reach;with bed alarm set   Time: 1423-1444 OT Time Calculation (min): 21 min Charges:  OT General Charges $OT Visit: 1 Procedure OT Evaluation $OT Eval Low Complexity: 1 Procedure G-Codes:    Kenrick Pore 03/30/2016, 3:37 PM  Marica OtterMaryellen Aurore Redinger, OTR/L [phone] 03/30/2016

## 2016-03-30 NOTE — Progress Notes (Signed)
CSW consulted due to COPD protocol. Last 6 months hospital admissions reviewed. Pt does not meet criteria for COPD protocol. Please re consult CSW if assistance with d/c planning is needed.  Cori RazorJamie Kathyjo Briere LCSW [phone]

## 2016-03-30 NOTE — Progress Notes (Signed)
PULMONARY / CRITICAL CARE MEDICINE   Name: Cheyenne [name] MRN: [id number] DOB: [birth year]    ADMISSION DATE:  03/29/2016 CONSULTATION DATE:  03/29/16  REFERRING MD:  Cheyenne [name] (EDP)  CHIEF COMPLAINT:  PTX related to fall.   HISTORY OF PRESENT ILLNESS:   Patient is a 70 year old female, being seen by Dr, Cheyenne [name] for severe COPD, on 3L O2 24/7,  former smoker, history of ETOH abuse, chronic pain, comes in with worsening SOB.  Patient was last seen by Dr. Delton [name] at the office in October 2017. She was noted to be in mild flare for COPD and was given prednisone taper over one week and levofloxacin for 7 days. She improved and went back to baseline. During that office visit note, she mentioned about having a fall.  Patient had a fall on November 8 and went to the ER. She had a fall while on walking up the stairs. She fell forward and landed on her right elbow. She was diagnosed with right humeral fracture for which she had a cast placed. She has been doing rehabilitation as far as her humeral fracture is concerned.   Patient noticed worsening dyspnea in the last couple of weeks. It sounded like her COPD was starting to flare up again. The dyspnea was worse the last week. Started with cough, productive, maybe low-grade fever. On Saturday, she fell down again from the stairs, hitting her chest directly on the edge of a step. She started hurting on the left side of her chest. She did not go to the emergency room as she wanted to wait it out.  Last night, she tried to get out of bed to use the bathroom but was unable to get on her feet and lost her balance. She fell again on her bottom and she called her husband. Still did not go to ED.   Because of her worsening dyspnea and cough, she decided to go to the emergency room. At the emergency room, chest x-ray showed moderate size pneumothorax. ED physician consulted [facility] and PCCM.   She has since been placed on 100% nonrebreather mask. Her breathing is a  little better compared to when she came in.   SUBJECTIVE:  Clinically improved and less SOB since CT placed on 12/4. Remains SOB but not as severe. Sleeping (but pt has been sleep deprived last 2 days 2/2 pain).   VITAL SIGNS: BP (!) 151/58   Pulse 85   Temp 97.5 F (36.4 C)   Resp (!) 30   Ht 4' 11.84" (1.52 m)   Wt 45.4 kg (100 lb 1.4 oz)   SpO2 100%   BMI 19.65 kg/m   HEMODYNAMICS:    VENTILATOR SETTINGS: FiO2 (%):  [100 %] 100 %  INTAKE / OUTPUT: I/O last 3 completed shifts: In: 1080 [I.V.:280; IV Piggyback:800] Out: -   PHYSICAL EXAMINATION: General:  Awake, Oriented x 3. Arousable (just fell asleep)..Less SOB. On [place].  Neuro:  CN grossly intact. (-) lateralizing signs HEENT:  (-) NVD, (-) oral thrush.  PERRLA.  Cardiovascular:  Good s1/s2. (-) s3/m/r/g Lungs:  (-) accessory muscle use. (-) subq air or crepitus.  (-) wheeze. Crackles at bases.  Abdomen:  (+) BS, soft, NT. Flat. (-) masses/tenderness.  Musculoskeletal:  (-) edema/clubbing/cyanosi. (+) cast in her RUE Skin:  Warm and dry. (-) rash.   LABS:  BMET  Recent Labs Lab 03/29/16 1211 03/30/16 0339  NA 133* 138  K 3.1* 3.2*  CL 92* 100*  CO2 32  31  BUN 7 12  CREATININE 0.46 0.47  GLUCOSE 106* 134*    Electrolytes  Recent Labs Lab 03/29/16 1211 03/30/16 0339  CALCIUM 8.8* 8.4*  MG  --  1.8  PHOS  --  2.8    CBC  Recent Labs Lab 03/29/16 1211 03/30/16 0339  WBC 14.7* 13.3*  HGB 10.3* 9.9*  HCT 31.4* 31.3*  PLT 274 268    Coag's No results for input(s): APTT, INR in the last 168 hours.  Sepsis Markers No results for input(s): LATICACIDVEN, PROCALCITON, O2SATVEN in the last 168 hours.  ABG  Recent Labs Lab 03/29/16 2038  PHART 7.315*  PCO2ART 65.2*  PO2ART 79.7*    Liver Enzymes  Recent Labs Lab 03/29/16 1211 03/30/16 0339  AST 34 26  ALT 21 19  ALKPHOS 117 100  BILITOT 0.8 0.8  ALBUMIN 3.9 3.4*    Cardiac Enzymes No results for input(s): TROPONINI,  PROBNP in the last 168 hours.  Glucose No results for input(s): GLUCAP in the last 168 hours.  Imaging Dg Chest 2 View  Result Date: 03/29/2016 CLINICAL DATA:  Fall. Recent humerus fracture. Cough. Congestion. COPD/asthma. EXAM: CHEST  2 VIEW COMPARISON:  02/10/2016 FINDINGS: Moderately degraded lateral view, secondary to patient positioning, including arms not raised above the head. Mid thoracic compression deformity is moderate and similar. Midline trachea. Normal heart size. Atherosclerosis in the transverse aorta. No pleural fluid. Moderate left-sided pneumothorax, primarily identified inferiorly and laterally. On the order of 40%. Underlying COPD/chronic bronchitis, as evidenced by interstitial thickening and hyperinflation. IMPRESSION: Moderate size left-sided pneumothorax. Aortic atherosclerosis. Critical test results telephoned to dr. Adela Lank. at the time of interpretation at 11:56 a.m.on 03/29/2016. Electronically Signed   By: Jeronimo Greaves M.D.   On: 03/29/2016 11:56   Ct Head Wo Contrast  Result Date: 03/29/2016 CLINICAL DATA:  Recent falls.  Right arm fracture. EXAM: CT HEAD WITHOUT CONTRAST CT CERVICAL SPINE WITHOUT CONTRAST TECHNIQUE: Multidetector CT imaging of the head and cervical spine was performed following the standard protocol without intravenous contrast. Multiplanar CT image reconstructions of the cervical spine were also generated. COMPARISON:  Cervical spine myelogram 05/07/2010 FINDINGS: CT HEAD FINDINGS Brain: Mild generalized atrophy and white matter disease is somewhat advanced for age no acute infarct, hemorrhage, or mass lesion is present. The ventricles are proportionate to the degree of atrophy. No significant extra-axial fluid collection is present. Vascular: Atherosclerotic calcifications are present within the cavernous internal carotid arteries bilaterally. There is no hyperdense vessel. Skull: The calvarium is intact. No acute fractures are present. No focal lytic or  blastic lesions are present. No significant extracranial soft tissue injury is present. Sinuses/Orbits: The paranasal sinuses and mastoid air cells are clear. Bilateral lens replacements are present. The globes and orbits are otherwise intact. CT CERVICAL SPINE FINDINGS Alignment: Slight retrolisthesis at C3-4 at C4-5 is stable. AP alignment is otherwise anatomic. Skull base and vertebrae: The craniocervical junction is within normal limits. Soft tissues and spinal canal: Dense atherosclerotic calcifications are present at the carotid bifurcations bilaterally with possible stenosis on the left. The soft tissues the neck are otherwise unremarkable. The spinal canal is grossly patent. Disc levels: Multilevel stenosis of the cervical spine is again noted. The most severe level is at C4-5 with moderate central and bilateral foraminal stenosis. Upper chest: A prominent left pneumothorax is noted. This was called earlier today. Severe centrilobular emphysema is noted. IMPRESSION: 1. Persistent left-sided pneumothorax. 2. Emphysema. 3. Multilevel spondylosis of the cervical spine without acute fracture  or traumatic subluxation. 4. Moderate atrophy and white matter disease likely reflects the sequela of chronic microvascular ischemia. 5. No acute intracranial abnormality. These results will be called to the ordering clinician or representative by the Radiologist Assistant, and communication documented in the PACS or zVision Dashboard. Electronically Signed   By: Marin Roberts M.D.   On: 03/29/2016 14:23   Ct Chest Wo Contrast  Result Date: 03/29/2016 CLINICAL DATA:  Chest tightness and worsening cough in patient with history of COPD. Pneumothorax by plain films earlier today. EXAM: CT CHEST WITHOUT CONTRAST TECHNIQUE: Multidetector CT imaging of the chest was performed following the standard protocol without IV contrast. COMPARISON:  PA and lateral chest this same day and 07/31/2015. CT chest 11/20/2012.  FINDINGS: Cardiovascular: Calcific aortic and coronary atherosclerosis is identified. No pericardial effusion. Heart size is normal. Mediastinum/Nodes: No enlarged mediastinal or axillary lymph nodes. Thyroid gland, trachea and esophagus demonstrate no significant findings. Lungs/Pleura: The patient has a known left hydropneumothorax with only a small amount of pleural fluid. Pneumothorax estimated at 70%. The lungs demonstrate extensive emphysematous disease. There is some atelectasis bilaterally, worse on the left. Upper Abdomen: The patient is status post cholecystectomy. No acute abnormality is identified. Musculoskeletal: T7 and T8 compression fracture appears are identified as seen 07/31/2015 exam. No worrisome lesion is identified. IMPRESSION: Left hydropneumothorax. There is only a small amount of pleural fluid. Pneumothorax is estimated at 70%. Severe emphysema. Atherosclerosis. Critical Value/emergent results were called by telephone at the time of interpretation on 03/29/2016 at 5:25 pm to [facility] who verbally acknowledged these results. Electronically Signed   By: Drusilla Kanner M.D.   On: 03/29/2016 17:27   Ct Cervical Spine Wo Contrast  Result Date: 03/29/2016 CLINICAL DATA:  Recent falls.  Right arm fracture. EXAM: CT HEAD WITHOUT CONTRAST CT CERVICAL SPINE WITHOUT CONTRAST TECHNIQUE: Multidetector CT imaging of the head and cervical spine was performed following the standard protocol without intravenous contrast. Multiplanar CT image reconstructions of the cervical spine were also generated. COMPARISON:  Cervical spine myelogram 05/07/2010 FINDINGS: CT HEAD FINDINGS Brain: Mild generalized atrophy and white matter disease is somewhat advanced for age no acute infarct, hemorrhage, or mass lesion is present. The ventricles are proportionate to the degree of atrophy. No significant extra-axial fluid collection is present. Vascular: Atherosclerotic calcifications are present within the  cavernous internal carotid arteries bilaterally. There is no hyperdense vessel. Skull: The calvarium is intact. No acute fractures are present. No focal lytic or blastic lesions are present. No significant extracranial soft tissue injury is present. Sinuses/Orbits: The paranasal sinuses and mastoid air cells are clear. Bilateral lens replacements are present. The globes and orbits are otherwise intact. CT CERVICAL SPINE FINDINGS Alignment: Slight retrolisthesis at C3-4 at C4-5 is stable. AP alignment is otherwise anatomic. Skull base and vertebrae: The craniocervical junction is within normal limits. Soft tissues and spinal canal: Dense atherosclerotic calcifications are present at the carotid bifurcations bilaterally with possible stenosis on the left. The soft tissues the neck are otherwise unremarkable. The spinal canal is grossly patent. Disc levels: Multilevel stenosis of the cervical spine is again noted. The most severe level is at C4-5 with moderate central and bilateral foraminal stenosis. Upper chest: A prominent left pneumothorax is noted. This was called earlier today. Severe centrilobular emphysema is noted. IMPRESSION: 1. Persistent left-sided pneumothorax. 2. Emphysema. 3. Multilevel spondylosis of the cervical spine without acute fracture or traumatic subluxation. 4. Moderate atrophy and white matter disease likely reflects the sequela of chronic  microvascular ischemia. 5. No acute intracranial abnormality. These results will be called to the ordering clinician or representative by the Radiologist Assistant, and communication documented in the PACS or zVision Dashboard. Electronically Signed   By: Marin Roberts M.D.   On: 03/29/2016 14:23   Dg Chest Port 1 View  Result Date: 03/30/2016 CLINICAL DATA:  Left pneumothorax. COPD. Respiratory failure with hypercapnia. EXAM: PORTABLE CHEST 1 VIEW COMPARISON:  03/29/2016 FINDINGS: Left chest tube remains in place. No residual pneumothorax  visualized. Decreased atelectasis or infiltrate is seen in the left retrocardiac lung base. Right lung is clear. Heart size is normal. IMPRESSION: Decreased atelectasis or infiltrate in the left track lung base. No residual pneumothorax visualized. Electronically Signed   By: Myles Rosenthal M.D.   On: 03/30/2016 07:18   Dg Chest Portable 1 View  Result Date: 03/29/2016 CLINICAL DATA:  Status post left chest tube placement today for pneumothorax. EXAM: PORTABLE CHEST 1 VIEW COMPARISON:  CT chest and PA and lateral chest earlier today. FINDINGS: New left chest tube is in place. Left pneumothorax is nearly completely resolved with only a very small apical component remaining. Lungs are emphysematous. IMPRESSION: Near complete resolution of left pneumothorax after chest tube placement. Emphysema. Electronically Signed   By: Drusilla Kanner M.D.   On: 03/29/2016 20:07   Dg Knee Complete 4 Views Right  Result Date: 03/29/2016 CLINICAL DATA:  Fall EXAM: RIGHT KNEE - COMPLETE 4+ VIEW COMPARISON:  None. FINDINGS: Four views of the right knee submitted. No acute fracture or subluxation. There is mild narrowing of medial joint compartment. Chondrocalcinosis is noted. Mild narrowing of patellofemoral joint space. IMPRESSION: No acute fracture or subluxation. Chondrocalcinosis. Osteoarthritic changes. Electronically Signed   By: Natasha Mead M.D.   On: 03/29/2016 17:26     STUDIES:  Chest ct scan 12/4   CULTURES: Sputum 12/4 > MRSA 12/4 Blood 12/4 >   ANTIBIOTICS: Vanc 12/4 > Aztreonam 12/4    SIGNIFICANT EVENTS: 12/4 admit for fall with significant PTX. CT placed by [facility].   LINES/TUBES:   DISCUSSION: Patient with severe COPD, on 3 L oxygen 24/7, with recent acute exacerbation of COPD likely secondary to bronchitis/PNA, comes in after repeated falls over the weekend. Now with pneumothorax on the left lung base on chest x-ray. S/P L CT by [facility] on 12/4.   Assessment/Plan:  Acute on chronic hypoxemic  respiratory failure secondary to acute exacerbation of COPD, significant pneumothorax in the left 2/2 fall, LLL infiltrate concerning for aspiration pna vs HCAP given repeated ED visits. S/P Chest tube on L, 12/4 by [facility] (Dr. [name])  - Cont Chest tube to suction. Anticipate will be a while before we can take CT out as pt with severe COPD - Daily  CXR - may ambulate with CT on water seal as long as with no inc in SOB.  - suggest deescalate to Unasyn unless she clinically worsens or if cultures end up being (+).  - f/u pan culture. Will check PCT to help with deescalation.  - cont pulmicort nebs BID, duoneb QID - medrol 20 mg IV q12 - pt on 3L o2 at baseline.  Cut down FiO2 to keep O2 sats > 88% - Hold off on IS for now (wiht PTX)  Chronic Pain/Anxiety  - Pt has been taking  NORCO 7.5/325 mg  3x/day for the last 15 yrs. I'm not sure if this is something to do with her repeated falls. Suggest continue her on PO pain meds (as she  is in pain) before she goes into withdrawal.  - Pt also takes Valium 5 mg PRN at HS.   Repeated falls - This needs to be addressed. May need STR.  - PT/OT evaln.   Plan discussed with pt. Husband not at bedside on 12/5. Pt is a DNR.   Pollie MeyerJ. Angelo A de Dios, MD Pulmonary and Critical Care Medicine [facility] Pager: [phone] After 3 pm or if no response, call [phone]  03/30/2016, 10:03 AM

## 2016-03-30 NOTE — Evaluation (Signed)
Physical Therapy Evaluation Patient Details Name: Cheyenne [name] MRN: [id number] DOB: 1946/04/04 Today's Date: 03/30/2016   History of Present Illness  Patient is a 70 year old female past medical history of alcohol abuse, hypothyroidism and chronic respiratory failure with hypoxia secondary to COPD on 3 L nasal cannula at home who for the past month has had several major falls at home which led to R humeral fracture treated with splinting.Patient presented to the emergency room on 12/4 with worsening cough, shortness of breath , pneumothorax.ygenation. Cardiothoracic surgery consulted who placed a chest tube. . Patient admitted to  for COPD exacerbation with possible healthcare associated pneumonia worsened from pneumothorax.  Clinical Impression  The patient tolerated mobility well. She is weak and requires assistance for balance. Patient reports plans to Dc to home. Uncertain if she has 24/7 caregivers. Friend in ri[place] states that she will have  Assistance. Pt admitted with above diagnosis. Pt currently with functional limitations due to the deficits listed below (see PT Problem List).  Pt will benefit from skilled PT to increase their independence and safety with mobility to allow discharge to the venue listed below.       Follow Up Recommendations Home health PT;Supervision/Assistance - 24 hour    Equipment Recommendations  Wheelchair (measurements PT)    Recommendations for Other Services       Precautions / Restrictions Precautions Precautions: Fall Precaution Comments: R humeral fx, CT on left Required Braces or Orthoses: Sling (humeral at all times) Restrictions RUE Weight Bearing: Weight bearing as tolerated      Mobility  Bed Mobility Overal bed mobility: Needs Assistance Bed Mobility: Supine to Sit;Sit to Supine     Supine to sit: Mod assist Sit to supine: Mod assist   General bed mobility comments: assist for trunk and to control descent  Transfers Overall  transfer level: Needs assistance Equipment used: 2 person hand held assist Transfers: Sit to/from Stand;Stand Pivot Transfers Sit to Stand: Min assist;+2 safety/equipment Stand pivot transfers: Min assist;+2 safety/equipment       General transfer comment: pt has chest tube  Ambulation/Gait                Stairs            Wheelchair Mobility    Modified Rankin (Stroke Patients Only)       Balance Overall balance assessment: History of Falls;Needs assistance Sitting-balance support: Feet supported;Single extremity supported Sitting balance-Leahy Scale: Fair     Standing balance support: No upper extremity supported Standing balance-Leahy Scale: Poor                               Pertinent Vitals/Pain Pain Assessment: Faces Faces Pain Scale: Hurts little more Pain Location: side  Pain Descriptors / Indicators: Aching;Stabbing Pain Intervention(s): Monitored during session;Premedicated before session;Repositioned    Home Living Family/patient expects to be discharged to:: Unsure Living Arrangements: Spouse/significant other;Non-relatives/Friends;Other relatives Available Help at Discharge: Family;Friend(s);Available PRN/intermittently Type of Home: House       Home Layout: One level Home Equipment: Bedside commode Additional Comments: will need 24/7.  Has tub and stool as well as 3:1    Prior Function Level of Independence: Needs assistance   Gait / Transfers Assistance Needed: walks holding th right arm     Comments: has had aide 2x week.  Has needed help for adls since humeral fx     Hand Dominance  Extremity/Trunk Assessment   Upper Extremity Assessment: Defer to OT evaluation           Lower Extremity Assessment: Generalized weakness      Cervical / Trunk Assessment: Kyphotic  Communication   Communication: No difficulties  Cognition Arousal/Alertness: Awake/alert Behavior During Therapy: WFL for tasks  assessed/performed Overall Cognitive Status: Within Functional Limits for tasks assessed                      General Comments      Exercises     Assessment/Plan    PT Assessment Patient needs continued PT services  PT Problem List Decreased strength;Decreased activity tolerance;Decreased balance;Decreased mobility;Decreased knowledge of precautions;Decreased safety awareness;Decreased knowledge of use of DME          PT Treatment Interventions DME instruction;Functional mobility training;Therapeutic activities;Therapeutic exercise;Balance training;Patient/family education    PT Goals (Current goals can be found in the Care Plan section)  Acute Rehab PT Goals Patient Stated Goal: none stated; agreeable to therapy PT Goal Formulation: With patient Time For Goal Achievement: 04/13/16 Potential to Achieve Goals: Good    Frequency Min 3X/week   Barriers to discharge Decreased caregiver support      Co-evaluation PT/OT/SLP Co-Evaluation/Treatment: Yes Reason for Co-Treatment: Complexity of the patient's impairments (multi-system involvement);For patient/therapist safety PT goals addressed during session: Mobility/safety with mobility OT goals addressed during session: ADL's and self-care       End of Session Equipment Utilized During Treatment: Oxygen Activity Tolerance: Patient tolerated treatment well Patient left: in bed;with call bell/phone within reach;with bed alarm set;with family/visitor present Nurse Communication: Mobility status         Time: 1425-1446 PT Time Calculation (min) (ACUTE ONLY): 21 min   Charges:   PT Evaluation $PT Eval Moderate Complexity: 1 Procedure     PT G CodesRada [name]:        Cheyenne [name] 03/30/2016, 4:27 PM Cheyenne [name] PT [phone]

## 2016-03-31 ENCOUNTER — Inpatient Hospital Stay (HOSPITAL_COMMUNITY): Payer: Medicare Other

## 2016-03-31 DIAGNOSIS — S270XXD Traumatic pneumothorax, subsequent encounter: Secondary | ICD-10-CM

## 2016-03-31 MED ORDER — SODIUM CHLORIDE 0.9 % IV SOLN
1.5000 g | Freq: Three times a day (TID) | INTRAVENOUS | Status: DC
  Administered 2016-03-31 – 2016-04-02 (×5): 1.5 g via INTRAVENOUS
  Filled 2016-03-31 (×8): qty 1.5

## 2016-03-31 NOTE — Progress Notes (Signed)
Patient ID: Cheyenne [name], female   DOB: [birth year], 70 y.o.   MRN: [id number]      [address]       [place], [id number]             [phone]                     LOS: 2 days   Subjective: Tried water seal yesterday with recurrent apical ptx  Objective: Vital signs in last 24 hours: Patient Vitals for the past 24 hrs:  BP Temp Temp src Pulse Resp SpO2  03/31/16 1215 (!) 166/81 - - (!) 104 15 95 %  03/31/16 1200 - - - 95 20 93 %  03/31/16 1112 - - - - - 97 %  03/31/16 1100 - - - (!) 101 (!) 21 96 %  03/31/16 1045 (!) 159/83 - - (!) 103 (!) 23 97 %  03/31/16 1000 - - - 97 (!) 27 96 %  03/31/16 0905 (!) 163/51 - - (!) 111 16 96 %  03/31/16 0900 - - - (!) 112 (!) 23 92 %  03/31/16 0800 - 98.6 F (37 C) Oral 98 (!) 21 94 %  03/31/16 0724 - - - - - 95 %  03/31/16 0700 - - - 83 (!) 23 94 %  03/31/16 0600 - - - 80 20 96 %  03/31/16 0500 - - - 82 (!) 25 96 %  03/31/16 0415 - - - - - 97 %  03/31/16 0405 - 97.8 F (36.6 C) Oral - - -  03/31/16 0400 94/61 - - 92 (!) 31 96 %  03/31/16 0300 - - - 89 (!) 23 96 %  03/31/16 0200 - - - 93 (!) 32 95 %  03/31/16 0100 - - - 90 (!) 27 95 %  03/31/16 0008 - - - - - 94 %  03/31/16 0000 (!) 150/82 - - (!) 101 (!) 22 92 %  03/30/16 2352 - 98.1 F (36.7 C) Oral - - -  03/30/16 2300 - - - 100 (!) 22 94 %  03/30/16 2200 - - - (!) 102 (!) 31 93 %  03/30/16 2100 - - - (!) 103 (!) 27 92 %  03/30/16 2036 (!) 127/52 98.2 F (36.8 C) Oral (!) 104 (!) 29 94 %  03/30/16 2017 - - - - - 99 %  03/30/16 2011 - - - - - 96 %  03/30/16 2005 121/76 - - (!) 102 (!) 25 92 %  03/30/16 1600 (!) 129/57 98 F (36.7 C) - 73 (!) 23 (!) 83 %  03/30/16 1530 - - - - - 95 %    Filed Weights   03/29/16 2311  Weight: 100 lb 1.4 oz (45.4 kg)    Hemodynamic parameters for last 24 hours:    Intake/Output from previous day: 12/05 0701 - 12/06 0700 In: 1495 [P.O.:120; I.V.:820; IV Piggyback:350] Out: 348 [Urine:300; Chest  Tube:48] Intake/Output this shift: Total I/O In: 585 [P.O.:480; Other:55; IV Piggyback:50] Out: 600 [Urine:600]  Scheduled Meds: . ampicillin-sulbactam (UNASYN) IV  1.5 g Intravenous Q8H  . budesonide (PULMICORT) nebulizer solution  0.5 mg Nebulization BID  . buPROPion  150 mg Oral Daily  . escitalopram  10 mg Oral Daily  . feeding supplement  1 Container Oral TID BM  . guaiFENesin  600 mg Oral BID  . ipratropium-albuterol  3 mL Nebulization QID  . levothyroxine  50 mcg Oral Daily  .  mouth rinse  15 mL Mouth Rinse BID  . methylPREDNISolone (SOLU-MEDROL) injection  20 mg Intravenous Q12H  . multivitamin with minerals  1 tablet Oral Daily  . sodium chloride flush  3 mL Intravenous Q12H   Continuous Infusions: PRN Meds:.acetaminophen **OR** acetaminophen, HYDROcodone-acetaminophen, ipratropium-albuterol, ondansetron **OR** ondansetron (ZOFRAN) IV    Lab Results: CBC: Recent Labs  03/29/16 1211 03/30/16 0339  WBC 14.7* 13.3*  HGB 10.3* 9.9*  HCT 31.4* 31.3*  PLT 274 268   BMET:  Recent Labs  03/29/16 1211 03/30/16 0339  NA 133* 138  K 3.1* 3.2*  CL 92* 100*  CO2 32 31  GLUCOSE 106* 134*  BUN 7 12  CREATININE 0.46 0.47  CALCIUM 8.8* 8.4*    PT/INR: No results for input(s): LABPROT, INR in the last 72 hours.   Radiology Dg Chest Port 1 View  Result Date: 03/31/2016 CLINICAL DATA:  Pneumothorax. EXAM: PORTABLE CHEST 1 VIEW COMPARISON:  Portable film earlier today. FINDINGS: LEFT chest tube is again noted. The LEFT lung is now re-expanded. No significant LEFT pneumothorax is observed. Mild subsegmental atelectasis at both bases without consolidation or effusion. Normal cardiomediastinal silhouette. Bones unremarkable. IMPRESSION: No significant pneumothorax is now observed. Re-expansion of the LEFT lung. Electronically Signed   By: Elsie StainJohn T Curnes M.D.   On: 03/31/2016 10:37   Dg Chest Port 1 View  Result Date: 03/31/2016 CLINICAL DATA:  Follow-up pneumothorax EXAM:  PORTABLE CHEST 1 VIEW COMPARISON:  03/30/2016 FINDINGS: Left-sided thoracostomy catheter is again noted in stable position. There has been recurrence of the patient's left-sided pneumothorax however predominately involving the upper lobe. Mild bibasilar atelectatic changes are noted particularly on the left. No other focal abnormality is seen. IMPRESSION: Recurrent left pneumothorax involving predominately the upper lobe. Critical Value/emergent results were called by telephone at the time of interpretation on 03/31/2016 at 7:25 am to [address], the pts nurse, who verbally acknowledged these results. Electronically Signed   By: Alcide CleverMark  Lukens M.D.   On: 03/31/2016 07:27    Assessment/Plan: Patients history reviewed and films reviewed Agree with chest tube management currently  Please call if need assistance   Delight OvensEdward B Ezekial Arns MD 03/31/2016 1:14 PM

## 2016-03-31 NOTE — Progress Notes (Signed)
Pharmacy Antibiotic Note  Cheyenne [name] is a 70 y.o. female with hx of COPD, chronic hypoxemic respiratory failure on home O2 now with SOB admitted on 03/29/2016 with pneumonia.  Pharmacy was consulted for Vancomycin dosing + Azactam per MD. Rule out aspiration  De-escalate abx to Unasyn, pharmacy to dose Noted PCN allergy: per patient - reaction as a child, hx of asthma. Has tolerated Ampicillin as adult.   Plan: Unasyn 1.5gm q8hr  Height: 4' 11.84" (152 cm) Weight: 100 lb 1.4 oz (45.4 kg) IBW/kg (Calculated) : 45.14  Temp (24hrs), Avg:98.1 F (36.7 C), Min:97.7 F (36.5 C), Max:98.6 F (37 C)   Recent Labs Lab 03/29/16 1211 03/30/16 0339  WBC 14.7* 13.3*  CREATININE 0.46 0.47    Estimated Creatinine Clearance: 47.3 mL/min (by C-G formula based on SCr of 0.47 mg/dL).    Allergies  Allergen Reactions  . Penicillins Anaphylaxis    Has patient had a PCN reaction causing immediate rash, facial/tongue/throat swelling, SOB or lightheadedness with hypotension: yes Has patient had a PCN reaction causing severe rash involving mucus membranes or skin necrosis: unknown Has patient had a PCN reaction that required hospitalization: unknown Has patient had a PCN reaction occurring within the last 10 years: no If all of the above answers are "NO", then may proceed with Cephalosporin use. HAS TOLERATED AMPICILLIN SINCE RX AS CHILD   . Promethazine Hcl Diarrhea and Nausea And Vomiting   Antimicrobials this admission: 12/4 azactam >> 12/6 12/4 vancomycin >> 12/6 12/6 Unasyn >>  Microbiology results:  12/4 BCx: ngtd   12/5 MRSA PCR: neg  Thank you for allowing pharmacy to be a part of this patient's care.  Otho BellowsGreen, Eeshan Verbrugge L PharmD Pager [phone] 03/31/2016, 11:04 AM

## 2016-03-31 NOTE — Progress Notes (Addendum)
CRITICAL VALUE ALERT  Critical value received:  CXR shows pneumo has reoccured  Date of notification:  03/31/16  Time of notification:  07:30  Critical value read back:Yes.    Nurse who received alert:  Gerlean RenShae Lee Pricsilla Lindvall, RN  MD notified (1st page):  Dr. David StallFeliz Ortiz  Time of first page:  07:30  MD notified (2nd page):  Time of second page:  Responding MD:  Dr. David StallFeliz Ortiz  Time MD responded:  08:00

## 2016-03-31 NOTE — Progress Notes (Addendum)
PULMONARY / CRITICAL CARE MEDICINE   Name: Cheyenne [name] MRN: [id number] DOB: 1946/01/22    ADMISSION DATE:  03/29/2016 CONSULTATION DATE:  03/29/16  REFERRING MD:  Dr. Adela Lank (EDP)  CHIEF COMPLAINT:  PTX related to fall.   HISTORY OF PRESENT ILLNESS:   Patient is a 70 year old female, being seen by Dr, [name] for severe COPD, on 3L O2 24/7,  former smoker, history of ETOH abuse, chronic pain, comes in with worsening SOB.  Patient was last seen by Dr. [name] at the office in October 2017. She was noted to be in mild flare for COPD and was given prednisone taper over one week and levofloxacin for 7 days. She improved and went back to baseline. During that office visit note, she mentioned about having a fall.  Patient had a fall on November 8 and went to the ER. She had a fall while on walking up the stairs. She fell forward and landed on her right elbow. She was diagnosed with right humeral fracture for which she had a cast placed. She has been doing rehabilitation as far as her humeral fracture is concerned.   Patient noticed worsening dyspnea in the last couple of weeks. It sounded like her COPD was starting to flare up again. The dyspnea was worse the last week. Started with cough, productive, maybe low-grade fever. On Saturday, she fell down again from the stairs, hitting her chest directly on the edge of a step. She started hurting on the left side of her chest. She did not go to the emergency room as she wanted to wait it out.  Last night, she tried to get out of bed to use the bathroom but was unable to get on her feet and lost her balance. She fell again on her bottom and she called her husband. Still did not go to ED.   Because of her worsening dyspnea and cough, she decided to go to the emergency room. At the emergency room, chest x-ray showed moderate size pneumothorax. ED physician consulted [facility] and PCCM.   She has since been placed on 100% nonrebreather mask. Her breathing is a  little better compared to when she came in.   SUBJECTIVE:  Clinically improved and less SOB since CT placed on 12/4.  Over all feels better since admission but remains to have SOB and pain on the L side.  Tried walking and placed CT to water seal on 12/5 briefly.  CXR with recurrent PTX    VITAL SIGNS: BP 94/61 (BP Location: Left Arm)   Pulse 80   Temp 98.6 F (37 C) (Oral)   Resp 20   Ht 4' 11.84" (1.52 m)   Wt 45.4 kg (100 lb 1.4 oz)   SpO2 95%   BMI 19.65 kg/m   HEMODYNAMICS:    VENTILATOR SETTINGS:    INTAKE / OUTPUT: I/O last 3 completed shifts: In: 2075 [P.O.:120; I.V.:1100; Other:205; IV Piggyback:650] Out: 348 [Urine:300; Chest Tube:48]  PHYSICAL EXAMINATION: General:  Awake, Oriented x 3. Able to speak sentences w/o being too SOB. Occasional accesory muscle use.  Neuro:  CN grossly intact. (-) lateralizing signs HEENT:  (-) NVD, (-) oral thrush.  PERRLA.  Cardiovascular:  Good s1/s2. (-) s3/m/r/g Lungs:  (-) accessory muscle use. (-) subq air or crepitus.  (-) wheeze. Crackles at bases. CT hooked to suction at 20 cm Water, some bubbles with inspiration Abdomen:  (+) BS, soft, NT. Flat. (-) masses/tenderness.  Musculoskeletal:  (-) edema/clubbing/cyanosi. (+) cast in her  RUE Skin:  Warm and dry. (-) rash.   LABS:  BMET  Recent Labs Lab 03/29/16 1211 03/30/16 0339  NA 133* 138  K 3.1* 3.2*  CL 92* 100*  CO2 32 31  BUN 7 12  CREATININE 0.46 0.47  GLUCOSE 106* 134*    Electrolytes  Recent Labs Lab 03/29/16 1211 03/30/16 0339  CALCIUM 8.8* 8.4*  MG  --  1.8  PHOS  --  2.8    CBC  Recent Labs Lab 03/29/16 1211 03/30/16 0339  WBC 14.7* 13.3*  HGB 10.3* 9.9*  HCT 31.4* 31.3*  PLT 274 268    Coag's No results for input(s): APTT, INR in the last 168 hours.  Sepsis Markers  Recent Labs Lab 03/30/16 0339  PROCALCITON <0.10    ABG  Recent Labs Lab 03/29/16 2038  PHART 7.315*  PCO2ART 65.2*  PO2ART 79.7*    Liver  Enzymes  Recent Labs Lab 03/29/16 1211 03/30/16 0339  AST 34 26  ALT 21 19  ALKPHOS 117 100  BILITOT 0.8 0.8  ALBUMIN 3.9 3.4*    Cardiac Enzymes No results for input(s): TROPONINI, PROBNP in the last 168 hours.  Glucose No results for input(s): GLUCAP in the last 168 hours.  Imaging Dg Chest Port 1 View  Result Date: 03/31/2016 CLINICAL DATA:  Follow-up pneumothorax EXAM: PORTABLE CHEST 1 VIEW COMPARISON:  03/30/2016 FINDINGS: Left-sided thoracostomy catheter is again noted in stable position. There has been recurrence of the patient's left-sided pneumothorax however predominately involving the upper lobe. Mild bibasilar atelectatic changes are noted particularly on the left. No other focal abnormality is seen. IMPRESSION: Recurrent left pneumothorax involving predominately the upper lobe. Critical Value/emergent results were called by telephone at the time of interpretation on 03/31/2016 at 7:25 am to [facility] L[facility]ay, the pts nurse, who verbally acknowledged these results. Electronically Signed   By: Alcide CleverMark  Lukens M.D.   On: 03/31/2016 07:27     STUDIES:  Chest ct scan 12/4   CULTURES: Sputum 12/4 > MRSA 12/4 Blood 12/4 >   ANTIBIOTICS: Vanc 12/4 > 12/6 Aztreonam 12/4 > 12/6  Unasyn 12/6 > 12/9   SIGNIFICANT EVENTS: 12/4 admit for fall with significant PTX. CT placed by [facility].   LINES/TUBES:   DISCUSSION: Patient with severe COPD, on 3 L oxygen 24/7, with recent acute exacerbation of COPD likely secondary to bronchitis/PNA, comes in after repeated falls over the weekend. Now with pneumothorax on the left lung base on chest x-ray. S/P L CT by [facility] on 12/4. Pt was briefly placed on water seal 12/5. CXR 12/6 with recurrent PTX, L. SOB better over all.   Assessment/Plan:  Acute on chronic hypoxemic respiratory failure secondary to acute exacerbation of COPD, significant pneumothorax in the left 2/2 fall, LLL infiltrate concerning for aspiration pna  S/P Chest tube on L, 12/4 by  [facility] (Dr. Tyrone SageGerhardt) Recurrent PTX, L, 12/6 after being briefly placed on water seal 12/5. - Pt is clinically improved despite recurrent PTX on the L.  Less SOB since admission.  I extensively d/w pt and her best friend re: over all condition and prognosis.  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  She also wants to avoid invasive procedures as much as possible.  Plan to rpt CXR at 11 am> if PTX  Is stable, no need for chest tube.  If CXR shows worse PTX,  Pt will decide whether she wants another chest tube or not.  She understands that if PTX gets bigger  she can die.  She said she is ready to die (as she is DNR).  At that point, if with clinical deterioration, we will transition her to comfort care.  Best friend is aware of plan.  Pt's husband will be by today and we need to discuss plan with him as well.  - Cont Chest tube to suction.  - Daily  CXR - deescalate to Unasyn and plan 5 days total Abx.  - cont pulmicort nebs BID, duoneb QID - medrol 20 mg IV q12 - pt on 3L o2 at baseline.  Cut down FiO2 to keep O2 sats > 88% - Hold off on IS for now (wiht PTX)  Chronic Pain/Anxiety  - Pt has been taking  NORCO 7.5/325 mg  3x/day for the last 15 yrs. continue her on PO pain meds prn  (as she is in pain)  - Pt also takes Valium 5 mg PRN at HS.   Repeated falls - This needs to be addressed. May need STR.  - cont PT/OT evaln.   Plan discussed with pt and her best friend.  Awaiting on husband.  Pt is a full DNR. [facility]. PCCM will be primary.   Pollie MeyerJ. Angelo A de Dios, MD Pulmonary and Critical Care Medicine [facility] Pager: [phone] After 3 pm or if no response, call [phone]  03/31/2016, 10:14 AM

## 2016-04-01 ENCOUNTER — Inpatient Hospital Stay (HOSPITAL_COMMUNITY): Payer: Medicare Other

## 2016-04-01 DIAGNOSIS — Z978 Presence of other specified devices: Secondary | ICD-10-CM

## 2016-04-01 LAB — PROCALCITONIN: Procalcitonin: <0.1 ng/mL

## 2016-04-01 MED ORDER — METHYLPREDNISOLONE SODIUM SUCC 40 MG IJ SOLR
10.0000 mg | Freq: Two times a day (BID) | INTRAMUSCULAR | Status: DC
  Administered 2016-04-01 – 2016-04-02 (×2): 10 mg via INTRAVENOUS
  Filled 2016-04-01 (×2): qty 1

## 2016-04-01 MED ORDER — DIAZEPAM 5 MG/ML PO CONC
5.0000 mg | Freq: Every evening | ORAL | Status: DC | PRN
  Filled 2016-04-01: qty 1

## 2016-04-01 MED ORDER — HEPARIN SODIUM (PORCINE) 5000 UNIT/ML IJ SOLN
5000.0000 [IU] | Freq: Three times a day (TID) | INTRAMUSCULAR | Status: DC
Start: 2016-04-01 — End: 2016-04-12
  Administered 2016-04-01 – 2016-04-12 (×31): 5000 [IU] via SUBCUTANEOUS
  Filled 2016-04-01 (×31): qty 1

## 2016-04-01 MED ORDER — HYDROCODONE-ACETAMINOPHEN 7.5-325 MG PO TABS
1.0000 | ORAL_TABLET | ORAL | Status: AC
  Administered 2016-04-01: 1 via ORAL
  Filled 2016-04-01: qty 1

## 2016-04-01 NOTE — Progress Notes (Signed)
PT Cancellation Note  Patient Details Name: Cheyenne [name] MRN: [id number] DOB: [birth year]   Cancelled Treatment:    Reason Eval/Treat Not Completed: Fatigue/lethargy limiting ability to participate. [name].   Rada HayHill, Junice Fei [name] 04/01/2016, 4:45 PM Blanchard KelchKaren Jlen Wintle PT [phone]

## 2016-04-01 NOTE — Progress Notes (Signed)
PULMONARY / CRITICAL CARE MEDICINE   Name: Cheyenne [name] MRN: [id number] DOB: February 25, 1946    ADMISSION DATE:  03/29/2016 CONSULTATION DATE:  03/29/16  REFERRING MD:  Dr. Adela LankFloyd (EDP)  CHIEF COMPLAINT:  PTX related to fall.   HISTORY OF PRESENT ILLNESS:   Cheyenne [name] is a 70 year old female, being seen by Dr, [name] for severe COPD, on 3L O2 24/7,  former smoker, history of ETOH abuse, chronic pain, comes in with worsening SOB.  Cheyenne [name] was last seen by Dr. [name] at the office in October 2017. She was noted to be in mild flare for COPD and was given prednisone taper over one week and levofloxacin for 7 days. She improved and went back to baseline. During that office visit note, she mentioned about having a fall.  Cheyenne [name] had a fall on November 8 and went to the ER. She had a fall while on walking up the stairs. She fell forward and landed on her right elbow. She was diagnosed with right humeral fracture for which she had a cast placed. She has been doing rehabilitation as far as her humeral fracture is concerned.   Cheyenne [name] noticed worsening dyspnea in the last couple of weeks. It sounded like her COPD was starting to flare up again. The dyspnea was worse the last week. Started with cough, productive, maybe low-grade fever. On Saturday, she fell down again from the stairs, hitting her chest directly on the edge of a step. She started hurting on the left side of her chest. She did not go to the emergency room as she wanted to wait it out.  Last night, she tried to get out of bed to use the bathroom but was unable to get on her feet and lost her balance. She fell again on her bottom and she called her husband. Still did not go to ED.   Because of her worsening dyspnea and cough, she decided to go to the emergency room. At the emergency room, chest x-ray showed moderate size pneumothorax. ED physician consulted [facility] and PCCM.   She has since been placed on 100% nonrebreather mask. Her breathing is a  little better compared to when she came in.   SUBJECTIVE:  Clinically improved.  Still SOB but not as bad as admit day. Has chronic back pain acting up. Some L sided pain where CT is.  CT on suction >  Some air bubbles with coughing. Has some anxiety. Eating some. Poor appetite.   VITAL SIGNS: BP (!) 145/75   Pulse 85   Temp 98.2 F (36.8 C)   Resp 20   Ht 4' 11.84" (1.52 m)   Wt 45.4 kg (100 lb 1.4 oz)   SpO2 98%   BMI 19.65 kg/m   HEMODYNAMICS:    VENTILATOR SETTINGS:    INTAKE / OUTPUT: I/O last 3 completed shifts: In: 1270 [P.O.:600; Other:320; IV Piggyback:350] Out: 2648 [Urine:2600; Chest Tube:48]  PHYSICAL EXAMINATION: General:  Awake, Oriented x 3. Comfortable. Mild distress. Neuro:  CN grossly intact. (-) lateralizing signs HEENT:  (-) NVD, (-) oral thrush.  PERRLA.  Cardiovascular:  Good s1/s2. (-) s3/m/r/g Lungs:  (-) accessory muscle use. (-) subq air or crepitus.  (-) wheeze. Crackles at bases. CT hooked to suction at 20 cm Water, some bubbles with inspiration Abdomen:  (+) BS, soft, NT. Flat. (-) masses/tenderness.  Musculoskeletal:  (-) edema/clubbing/cyanosi. (+) cast in her RUE Skin:  Warm and dry. (-) rash.   LABS:  BMET  Recent Labs Lab 03/29/16  1211 03/30/16 0339  NA 133* 138  K 3.1* 3.2*  CL 92* 100*  CO2 32 31  BUN 7 12  CREATININE 0.46 0.47  GLUCOSE 106* 134*    Electrolytes  Recent Labs Lab 03/29/16 1211 03/30/16 0339  CALCIUM 8.8* 8.4*  MG  --  1.8  PHOS  --  2.8    CBC  Recent Labs Lab 03/29/16 1211 03/30/16 0339  WBC 14.7* 13.3*  HGB 10.3* 9.9*  HCT 31.4* 31.3*  PLT 274 268    Coag's No results for input(s): APTT, INR in the last 168 hours.  Sepsis Markers  Recent Labs Lab 03/30/16 0339 04/01/16 0327  PROCALCITON <0.10 <0.10    ABG  Recent Labs Lab 03/29/16 2038  PHART 7.315*  PCO2ART 65.2*  PO2ART 79.7*    Liver Enzymes  Recent Labs Lab 03/29/16 1211 03/30/16 0339  AST 34 26   ALT 21 19  ALKPHOS 117 100  BILITOT 0.8 0.8  ALBUMIN 3.9 3.4*    Cardiac Enzymes No results for input(s): TROPONINI, PROBNP in the last 168 hours.  Glucose No results for input(s): GLUCAP in the last 168 hours.  Imaging Dg Chest Port 1 View  Result Date: 04/01/2016 CLINICAL DATA:  COPD. Status post fall 03/27/2016. Pneumothorax diagnosed 03/29/2016. Chest tube in place. EXAM: PORTABLE CHEST 1 VIEW COMPARISON:  Single-view of the chest 03/31/2016 and 03/30/2016. FINDINGS: Left chest tube remains in place. Subcutaneous emphysema is seen along the left chest wall. No pneumothorax is identified. The lungs are emphysematous. No focal airspace disease. Heart size normal. Atherosclerosis noted. IMPRESSION: Left chest tube in place.  Negative for pneumothorax. Emphysema. Atherosclerosis. Electronically Signed   By: Drusilla Kannerhomas  Dalessio M.D.   On: 04/01/2016 07:51   Dg Chest Port 1 View  Result Date: 03/31/2016 CLINICAL DATA:  Pneumothorax. EXAM: PORTABLE CHEST 1 VIEW COMPARISON:  Portable film earlier today. FINDINGS: LEFT chest tube is again noted. The LEFT lung is now re-expanded. No significant LEFT pneumothorax is observed. Mild subsegmental atelectasis at both bases without consolidation or effusion. Normal cardiomediastinal silhouette. Bones unremarkable. IMPRESSION: No significant pneumothorax is now observed. Re-expansion of the LEFT lung. Electronically Signed   By: Elsie StainJohn T Curnes M.D.   On: 03/31/2016 10:37     STUDIES:  Chest ct scan 12/4   CULTURES: Sputum 12/4 > MRSA 12/4 Blood 12/4 >   ANTIBIOTICS: Vanc 12/4 > 12/6 Aztreonam 12/4 > 12/6  Unasyn 12/6 > 12/9   SIGNIFICANT EVENTS: 12/4 admit for fall with significant PTX. CT placed by [facility].   LINES/TUBES:   DISCUSSION: Cheyenne [name] with severe COPD, on 3 L oxygen 24/7, with recent acute exacerbation of COPD likely secondary to bronchitis/PNA, comes in after repeated falls over the weekend. Now with pneumothorax on the left lung  base on chest x-ray. S/P L CT by [facility] on 12/4. Pt was briefly placed on water seal 12/5. CXR 12/6 with recurrent PTX, L. SOB better over all.   Assessment/Plan:  Acute on chronic hypoxemic respiratory failure secondary to acute exacerbation of COPD, significant pneumothorax in the left 2/2 fall, LLL infiltrate concerning for aspiration pna  S/P Chest tube on L, 12/4 by [facility] (Dr. Tyrone SageGerhardt) Recurrent PTX, L, 12/6 after being briefly placed on water seal 12/5. - CXR on 12/6 noon and 12/7 am w/o PTX. Will continue pt on suction for now. Anticipate it will be awhile before we can place her on water seal.  She was briefly on water seal and had recurrent PTX on 12/6.  -  Cont Chest tube to suction.  - Daily  CXR - deescalate to Unasyn and plan 5 days total Abx. Last day in am.  - cont pulmicort nebs BID, duoneb QID - decrease medrol to 10 mg q12 then d/c in am.  - pt on 3L o2 at baseline.  Cut down FiO2 to keep O2 sats > 88% - Hold off on IS for now (with PTX)  Chronic Pain/Anxiety  - Pt has been taking  NORCO 7.5/325 mg  3x/day for the last 15 yrs. continue her on PO pain meds prn. I gave her extra dose 12/7 as her back was hurting.  - Cont Valium 5 mg PRN at HS.   Repeated falls - This needs to be addressed prior to discharge.  - cont PT/OT evaln.   Disposition/Code Status Pt is a full DNR.  I discussed with pt and her best friend on 12/6 and 12/7  re: her over all condition/prognosis.  If her resp status worsens and her PTX worsens to the point that she needs another CT, she DOES NOT want another chest tube. At that point, pt wishes to be transitioned to comfort care.  Husband is aware of plan per pt and friend. (Husband is psychologist).   DVT prophylaxis.     Pollie Meyer, MD Pulmonary and Critical Care Medicine [facility] Pager: [phone] After 3 pm or if no response, call [phone]  04/01/2016, 10:11 AM

## 2016-04-02 ENCOUNTER — Inpatient Hospital Stay (HOSPITAL_COMMUNITY): Payer: Medicare Other

## 2016-04-02 LAB — BASIC METABOLIC PANEL
Anion gap: 9 (ref 5–15)
BUN: 8 mg/dL (ref 6–20)
CHLORIDE: 92 mmol/L — AB (ref 101–111)
CO2: 39 mmol/L — AB (ref 22–32)
CREATININE: 0.36 mg/dL — AB (ref 0.44–1.00)
Calcium: 8.9 mg/dL (ref 8.9–10.3)
GFR calc non Af Amer: >60 mL/min (ref >60)
Glucose, Bld: 109 mg/dL — ABNORMAL HIGH (ref 65–99)
Potassium: 2.8 mmol/L — ABNORMAL LOW (ref 3.5–5.1)
Sodium: 140 mmol/L (ref 135–145)

## 2016-04-02 LAB — CBC
HEMATOCRIT: 29.6 % — AB (ref 36.0–46.0)
HEMOGLOBIN: 9.6 g/dL — AB (ref 12.0–15.0)
MCH: 30.3 pg (ref 26.0–34.0)
MCHC: 32.4 g/dL (ref 30.0–36.0)
MCV: 93.4 fL (ref 78.0–100.0)
Platelets: 263 10*3/uL (ref 150–400)
RBC: 3.17 MIL/uL — ABNORMAL LOW (ref 3.87–5.11)
RDW: 12.3 % (ref 11.5–15.5)
WBC: 13.2 10*3/uL — ABNORMAL HIGH (ref 4.0–10.5)

## 2016-04-02 LAB — PHOSPHORUS: PHOSPHORUS: 2.7 mg/dL (ref 2.5–4.6)

## 2016-04-02 LAB — MAGNESIUM: Magnesium: 1.8 mg/dL (ref 1.7–2.4)

## 2016-04-02 MED ORDER — DIAZEPAM 1 MG/ML PO SOLN
5.0000 mg | Freq: Every evening | ORAL | Status: DC | PRN
  Administered 2016-04-02: 5 mg via ORAL

## 2016-04-02 MED ORDER — PREDNISONE 10 MG PO TABS
10.0000 mg | ORAL_TABLET | Freq: Every day | ORAL | Status: AC
  Administered 2016-04-03 – 2016-04-05 (×3): 10 mg via ORAL
  Filled 2016-04-02 (×3): qty 1

## 2016-04-02 MED ORDER — POTASSIUM CHLORIDE CRYS ER 20 MEQ PO TBCR
40.0000 meq | EXTENDED_RELEASE_TABLET | ORAL | Status: AC
  Administered 2016-04-02 (×2): 40 meq via ORAL
  Filled 2016-04-02 (×2): qty 2

## 2016-04-02 NOTE — Progress Notes (Signed)
PULMONARY / CRITICAL CARE MEDICINE   Name: Cheyenne [name] MRN: [id number] DOB: 1946/02/17    ADMISSION DATE:  03/29/2016 CONSULTATION DATE:  03/29/16  REFERRING MD:  Dr. Adela [name] (EDP)  CHIEF COMPLAINT:  PTX related to fall.   HISTORY OF PRESENT ILLNESS:   Patient is a 70 year old female, being seen by Dr, Cheyenne [name] for severe COPD, on 3L O2 24/7,  former smoker, history of ETOH abuse, chronic pain, comes in with worsening SOB.  Patient was last seen by Dr. Delton [name] at the office in October 2017. She was noted to be in mild flare for COPD and was given prednisone taper over one week and levofloxacin for 7 days. She improved and went back to baseline. During that office visit note, she mentioned about having a fall.  Patient had a fall on November 8 and went to the ER. She had a fall while on walking up the stairs. She fell forward and landed on her right elbow. She was diagnosed with right humeral fracture for which she had a cast placed. She has been doing rehabilitation as far as her humeral fracture is concerned.   Patient noticed worsening dyspnea in the last couple of weeks. It sounded like her COPD was starting to flare up again. The dyspnea was worse the last week. Started with cough, productive, maybe low-grade fever. On Saturday, she fell down again from the stairs, hitting her chest directly on the edge of a step. She started hurting on the left side of her chest. She did not go to the emergency room as she wanted to wait it out.  Last night, she tried to get out of bed to use the bathroom but was unable to get on her feet and lost her balance. She fell again on her bottom and she called her husband. Still did not go to ED.   Because of her worsening dyspnea and cough, she decided to go to the emergency room. At the emergency room, chest x-ray showed moderate size pneumothorax. ED physician consulted [facility]CVS and PCCM.   She has since been placed on 100% nonrebreather mask. Her breathing is a  little better compared to when she came in.   SUBJECTIVE:  Clinically improved.  Still SOB but not as bad as admit day. No air leak from ct  VITAL SIGNS: BP (!) 111/34 (BP Location: Left Arm)   Pulse 93   Temp 97.2 F (36.2 C) (Oral)   Resp (!) 22   Ht 4' 11.84" (1.52 m)   Wt 100 lb 1.4 oz (45.4 kg)   SpO2 98%   BMI 19.65 kg/m   HEMODYNAMICS:    VENTILATOR SETTINGS:    INTAKE / OUTPUT: I/O last 3 completed shifts: In: 833 [P.O.:720; I.V.:3; Other:10; IV Piggyback:100] Out: 4119 [Urine:4025; Chest Tube:94]  PHYSICAL EXAMINATION: General:  Awake, Oriented x 3. Comfortable. Mild distress. Up to bsc Neuro:  CN grossly intact. (-) lateralizing signs HEENT:  (-) NVD, (-) oral thrush.  PERRLA.  Cardiovascular:  Good s1/s2. (-) s3/m/r/g Lungs:  (-) accessory muscle use. (-) subq air or crepitus.  (-) wheeze. Crackles at bases. CT hooked to suction at 20 cm Water,no air leak noted Abdomen:  (+) BS, soft, NT. Flat. (-) masses/tenderness.  Musculoskeletal:  (-) edema/clubbing/cyanosi. (+) cast in her RUE Skin:  Warm and dry. (-) rash.   LABS:  BMET  Recent Labs Lab 03/29/16 1211 03/30/16 0339 04/02/16 0337  NA 133* 138 140  K 3.1* 3.2* 2.8*  CL 92*  100* 92*  CO2 32 31 39*  BUN 7 12 8   CREATININE 0.46 0.47 0.36*  GLUCOSE 106* 134* 109*    Electrolytes  Recent Labs Lab 03/29/16 1211 03/30/16 0339 04/02/16 0337  CALCIUM 8.8* 8.4* 8.9  MG  --  1.8 1.8  PHOS  --  2.8 2.7    CBC  Recent Labs Lab 03/29/16 1211 03/30/16 0339 04/02/16 0337  WBC 14.7* 13.3* 13.2*  HGB 10.3* 9.9* 9.6*  HCT 31.4* 31.3* 29.6*  PLT 274 268 263    Coag's No results for input(s): APTT, INR in the last 168 hours.  Sepsis Markers  Recent Labs Lab 03/30/16 0339 04/01/16 0327  PROCALCITON <0.10 <0.10    ABG  Recent Labs Lab 03/29/16 2038  PHART 7.315*  PCO2ART 65.2*  PO2ART 79.7*    Liver Enzymes  Recent Labs Lab 03/29/16 1211 03/30/16 0339  AST 34 26   ALT 21 19  ALKPHOS 117 100  BILITOT 0.8 0.8  ALBUMIN 3.9 3.4*    Cardiac Enzymes No results for input(s): TROPONINI, PROBNP in the last 168 hours.  Glucose No results for input(s): GLUCAP in the last 168 hours.  Imaging Dg Chest Port 1 View  Result Date: 04/02/2016 CLINICAL DATA:  Pneumothorax EXAM: PORTABLE CHEST 1 VIEW COMPARISON:  04/01/2016 FINDINGS: Cardiomediastinal silhouette is stable. Left chest tube is unchanged in position. Subcutaneous emphysema left chest wall. No infiltrate or pulmonary edema. No pneumothorax. IMPRESSION: Stable left chest tube position. No pneumothorax. Subcutaneous emphysema left chest wall again noted. Electronically Signed   By: Natasha MeadLiviu  Pop M.D.   On: 04/02/2016 09:05     STUDIES:  Chest ct scan 12/4   CULTURES: Sputum 12/4 > MRSA 12/4 Blood 12/4 >   ANTIBIOTICS: Vanc 12/4 > 12/6 Aztreonam 12/4 > 12/6  Unasyn 12/6 > 12/8   SIGNIFICANT EVENTS: 12/4 admit for fall with significant PTX. CT placed by [facility].   LINES/TUBES:   DISCUSSION: Patient with severe COPD, on 3 L oxygen 24/7, with recent acute exacerbation of COPD likely secondary to bronchitis/PNA, comes in after repeated falls over the weekend. Now with pneumothorax on the left lung base on chest x-ray. S/P L CT by [facility] on 12/4. Pt was briefly placed on water seal 12/5. CXR 12/6 with recurrent PTX, L. SOB better over all.   Assessment/Plan:  Acute on chronic hypoxemic respiratory failure secondary to acute exacerbation of COPD, significant pneumothorax in the left 2/2 fall, LLL infiltrate concerning for aspiration pna  S/P Chest tube on L, 12/4 by [facility] (Dr. Tyrone SageGerhardt) Recurrent PTX, L, 12/6 after being briefly placed on water seal 12/5. - CXR on 12/6 noon and 12/7 am w/o PTX. Will continue pt on suction for now. Anticipate it will be awhile before we can place her on water seal.  She was briefly on water seal and had recurrent PTX on 12/6.  - Cont Chest tube to suction. Plan to keep  on suction over weekend and try water seal on [place].  - Daily  CXR - will d/c unasyn 12/8  - cont pulmicort nebs BID, duoneb QID - will switch steroids to PO and taper off after 3 days - pt on 3L o2 at baseline.  Cut down FiO2 to keep O2 sats > 88% - Hold off on IS for now (with PTX)  Chronic Pain/Anxiety  - Pt has been taking  NORCO 7.5/325 mg  3x/day for the last 15 yrs. continue her on PO pain meds prn. I gave her extra  dose 12/7 as her back was hurting.  - Cont Valium 5 mg PRN at HS.    Disposition/Code Status Pt is a full DNR.  I discussed with pt and her best friend on 12/6 and 12/7  re: her over all condition/prognosis.  I spoke with the patient and her  husband, Dr. Cyndia Skeeters, on December 8th  and discussed with them plan of care.   If her resp status worsens and her PTX worsens to the point that she needs another CT, she DOES NOT want another chest tube. At that point, pt wishes to be transitioned to comfort care.   Need to readdress GOC on Monday.  If pneumothorax re-occurs on water seal Monday, pt may decide to discontinue chest tube and be transitioned to comfort care and go home. She is ready to die. Husband is aware of the plan.  DVT prophylaxis.     Pollie Meyer, MD 04/02/2016, 1:10 PM [facility] Pager [phone] After 3 pm or if no answer, call [phone]   [facility] Minor ACNP Adolph Pollack PCCM Pager [phone] till 3 pm If no answer page [phone] 04/02/2016, 9:23 AM

## 2016-04-02 NOTE — Progress Notes (Signed)
eLink Physician-Brief Progress Note Patient Name: Cheyenne [name] DOB: March 15, 1946 MRN: [id number]   Date of Service  04/02/2016  HPI/Events of Note  hypokalemia  eICU Interventions  Potassium replaced     Intervention Category Intermediate Interventions: Electrolyte abnormality - evaluation and management  DETERDING,[name] 04/02/2016, 4:22 AM

## 2016-04-02 NOTE — Progress Notes (Signed)
Physical Therapy Treatment Patient Details Name: Cheyenne [name] MRN: [id number] DOB: 1945/12/18 Today's Date: 04/02/2016    History of Present Illness Patient is a 70 year old female past medical history of alcohol abuse, hypothyroidism and chronic respiratory failure with hypoxia secondary to COPD on 3 L nasal cannula at home who for the past month has had several major falls at home which led to R humeral fracture treated with splinting.Patient presented to the emergency room on 12/4 with worsening cough, shortness of breath , pneumothorax.ygenation. Cardiothoracic surgery consulted who placed a chest tube. . Patient admitted to  for COPD exacerbation with possible healthcare associated pneumonia worsened from pneumothorax.    PT Comments    The patient is only able to transfer due to Wall suction to CT. The  [facility] and that is about all that she can do at this time. Continue PT while in acute care.  Follow Up Recommendations  Home health PT;Supervision/Assistance - 24 hour (uncertain if this is reasonable DC plan.)     Equipment Recommendations  Wheelchair (measurements PT)    Recommendations for Other Services       Precautions / Restrictions Precautions Precautions: Fall Precaution Comments: R humeral fx, CT on left,  DO NOT UNHOOK FROM WALL SUCTION TO CT Required Braces or Orthoses: Sling Restrictions RUE Weight Bearing: Weight bearing as tolerated    Mobility  Bed Mobility Overal bed mobility: Needs Assistance Bed Mobility: Supine to Sit;Sit to Supine     Supine to sit: Min assist;HOB elevated Sit to supine: Min assist;HOB elevated   General bed mobility comments: assist for trunk and to control descent  Transfers Overall transfer level: Needs assistance Equipment used: 1 person hand held assist Transfers: Sit to/from [employer] CorporationStand;Stand Pivot Transfers Sit to Stand: Mod assist Stand pivot transfers: Mod assist       General  transfer comment: pt has chest tube, Stands from bed and BSC with min assistance. Able to take a few steps along the bed.  Ambulation/Gait                 Stairs            Wheelchair Mobility    Modified Rankin (Stroke Patients Only)       Balance                                    Cognition Arousal/Alertness: Awake/alert                          Exercises      General Comments        Pertinent Vitals/Pain Faces Pain Scale: Hurts little more Pain Location: side at CT and right arm Pain Descriptors / Indicators: Aching;Sore Pain Intervention(s): Monitored during session;Premedicated before session    Home Living                      Prior Function            PT Goals (current goals can now be found in the care plan section) Progress towards PT goals: Progressing toward goals    Frequency    Min 3X/week      PT Plan Current plan remains appropriate    Co-evaluation             End of Session Equipment Utilized During  Treatment: Oxygen Activity Tolerance: Patient tolerated treatment well Patient left: in bed;with call bell/phone within reach;with bed alarm set     Time: [id number] PT Time Calculation (min) (ACUTE ONLY): 31 min  Charges:  $Therapeutic Activity: 8-22 mins $Self Care/Home Management: 8-22                    G Codes:      Rada HayHill, Mitchael Luckey [name] 04/02/2016, 1:57 PM Blanchard KelchKaren Dorothey Oetken PT [phone]

## 2016-04-02 NOTE — Progress Notes (Signed)
Date:  April 02, 2016 Chart reviewed for concurrent status and case management needs. Will continue to follow patient progress.  Still has Chest tube and pna on xray Discharge Planning: following for needs Expected discharge date: [id number] Marcelle SmilingRhonda Davis, BSN, [place]3, [place]   [phone]

## 2016-04-03 ENCOUNTER — Inpatient Hospital Stay (HOSPITAL_COMMUNITY): Payer: Medicare Other

## 2016-04-03 DIAGNOSIS — J9312 Secondary spontaneous pneumothorax: Secondary | ICD-10-CM

## 2016-04-03 LAB — CBC
HCT: 31.7 % — ABNORMAL LOW (ref 36.0–46.0)
HEMOGLOBIN: 10.1 g/dL — AB (ref 12.0–15.0)
MCH: 30 pg (ref 26.0–34.0)
MCHC: 31.9 g/dL (ref 30.0–36.0)
MCV: 94.1 fL (ref 78.0–100.0)
Platelets: 320 10*3/uL (ref 150–400)
RBC: 3.37 MIL/uL — AB (ref 3.87–5.11)
RDW: 12.5 % (ref 11.5–15.5)
WBC: 13.7 10*3/uL — ABNORMAL HIGH (ref 4.0–10.5)

## 2016-04-03 LAB — CULTURE, BLOOD (ROUTINE X 2)
CULTURE: NO GROWTH
Culture: NO GROWTH

## 2016-04-03 LAB — BASIC METABOLIC PANEL
ANION GAP: 9 (ref 5–15)
BUN: 9 mg/dL (ref 6–20)
CHLORIDE: 92 mmol/L — AB (ref 101–111)
CO2: 34 mmol/L — AB (ref 22–32)
CREATININE: 0.39 mg/dL — AB (ref 0.44–1.00)
Calcium: 8.8 mg/dL — ABNORMAL LOW (ref 8.9–10.3)
GFR calc non Af Amer: >60 mL/min (ref >60)
GLUCOSE: 105 mg/dL — AB (ref 65–99)
Potassium: 3.2 mmol/L — ABNORMAL LOW (ref 3.5–5.1)
Sodium: 135 mmol/L (ref 135–145)

## 2016-04-03 LAB — PROCALCITONIN: Procalcitonin: <0.1 ng/mL

## 2016-04-03 NOTE — Progress Notes (Signed)
PULMONARY / CRITICAL CARE MEDICINE   Name: Cheyenne [name] MRN: [id number] DOB: Nov 29, 1945    ADMISSION DATE:  03/29/2016 CONSULTATION DATE:  03/29/16  REFERRING MD:  Dr. Adela LankFloyd (EDP)  CHIEF COMPLAINT:  PTX related to fall.   BRIEF   Patient is a 70 year old female, being seen by Dr, [name] for severe COPD, on 3L O2 24/7,  former smoker, history of ETOH abuse, chronic pain, comes in with worsening SOB.  Patient was last seen by Dr. [name] at the office in October 2017. She was noted to be in mild flare for COPD and was given prednisone taper over one week and levofloxacin for 7 days. She improved and went back to baseline. During that office visit note, she mentioned about having a fall.  Patient had a fall on November 8 and went to the ER. She had a fall while on walking up the stairs. She fell forward and landed on her right elbow. She was diagnosed with right humeral fracture for which she had a cast placed. She has been doing rehabilitation as far as her humeral fracture is concerned.   Patient noticed worsening dyspnea in the last couple of weeks. It sounded like her COPD was starting to flare up again. The dyspnea was worse the last week. Started with cough, productive, maybe low-grade fever. On Saturday, she fell down again from the stairs, hitting her chest directly on the edge of a step. She started hurting on the left side of her chest. She did not go to the emergency room as she wanted to wait it out.  Last night, she tried to get out of bed to use the bathroom but was unable to get on her feet and lost her balance. She fell again on her bottom and she called her husband. Still did not go to ED.   Because of her worsening dyspnea and cough, she decided to go to the emergency room. At the emergency room, chest x-ray showed moderate size pneumothorax. ED physician consulted TCVS and PCCM.   She has since been placed on 100% nonrebreather mask. Her breathing is a little better compared to  when she came in.   SIGNIFICANT EVENTS: 12/4 admit for fall with significant PTX. CT placed by [facility].  12/8 - clinically improved.  Still SOB but not as bad as admit day. No air leak from ct   SUBJECTIVE/OVERNIGHT/INTERVAL HX 04/03/16 - down to [place]. On suction - left lung appears fully expanded  VITAL SIGNS: BP (!) 170/80   Pulse (!) 102   Temp 97.4 F (36.3 C) (Oral)   Resp 19   Ht 4' 11.84" (1.52 m)   Wt 45.4 kg (100 lb 1.4 oz)   SpO2 97%   BMI 19.65 kg/m   HEMODYNAMICS:    VENTILATOR SETTINGS:    INTAKE / OUTPUT: I/O last 3 completed shifts: In: 963 [P.O.:720; I.V.:3; Other:240] Out: 2879 [Urine:2775; Chest Tube:104]  PHYSICAL EXAMINATION: General:  Awake, Oriented x 3. Comfortable. Mild distress. Up to bsc Neuro:  CN grossly intact. (-) lateralizing signs HEENT:  (-) NVD, (-) oral thrush.  PERRLA.  Cardiovascular:  Good s1/s2. (-) s3/m/r/g Lungs:  (-) accessory muscle use. (-) subq air or crepitus.  (-) wheeze. Crackles at bases. CT hooked to suction at 20 cm Water,no air leak noted. BARRELL CHEST + Abdomen:  (+) BS, soft, NT. Flat. (-) masses/tenderness.  Musculoskeletal:  (-) edema/clubbing/cyanosi. (+) cast in her RUE Skin:  Warm and dry. (-) rash.   LABS:  PULMONARY  Recent Labs Lab 03/29/16 2038  PHART 7.315*  PCO2ART 65.2*  PO2ART 79.7*  HCO3 32.4*  O2SAT 95.1    CBC  Recent Labs Lab 03/30/16 0339 04/02/16 0337 04/03/16 0343  HGB 9.9* 9.6* 10.1*  HCT 31.3* 29.6* 31.7*  WBC 13.3* 13.2* 13.7*  PLT 268 263 320    COAGULATION No results for input(s): INR in the last 168 hours.  CARDIAC  No results for input(s): TROPONINI in the last 168 hours. No results for input(s): PROBNP in the last 168 hours.   CHEMISTRY  Recent Labs Lab 03/29/16 1211 03/30/16 0339 04/02/16 0337 04/03/16 0343  NA 133* 138 140 135  K 3.1* 3.2* 2.8* 3.2*  CL 92* 100* 92* 92*  CO2 32 31 39* 34*  GLUCOSE 106* 134* 109* 105*  BUN 7 12 8 9   CREATININE 0.46  0.47 0.36* 0.39*  CALCIUM 8.8* 8.4* 8.9 8.8*  MG  --  1.8 1.8  --   PHOS  --  2.8 2.7  --    Estimated Creatinine Clearance: 47.3 mL/min (by C-G formula based on SCr of 0.39 mg/dL (L)).   LIVER  Recent Labs Lab 03/29/16 1211 03/30/16 0339  AST 34 26  ALT 21 19  ALKPHOS 117 100  BILITOT 0.8 0.8  PROT 6.8 6.2*  ALBUMIN 3.9 3.4*     INFECTIOUS  Recent Labs Lab 03/30/16 0339 04/01/16 0327 04/03/16 0343  PROCALCITON <0.10 <0.10 <0.10     ENDOCRINE CBG (last 3)  No results for input(s): GLUCAP in the last 72 hours.       IMAGING x48h  - image(s) personally visualized  -   highlighted in bold Dg Chest Port 1 View  Result Date: 04/03/2016 CLINICAL DATA:  Patient with pneumothorax.  Re-evaluation. EXAM: PORTABLE CHEST 1 VIEW COMPARISON:  Chest radiograph 04/02/2016. FINDINGS: Monitoring leads overlie the patient. Normal cardiac and mediastinal contours. Emphysematous change. Left chest tube remains in place. No definite left-sided pneumothorax. Subcutaneous emphysema left chest wall. IMPRESSION: No definite pneumothorax.  Left chest tube in place. Electronically Signed   By: Annia Belt M.D.   On: 04/03/2016 07:01   Dg Chest Port 1 View  Result Date: 04/02/2016 CLINICAL DATA:  Pneumothorax EXAM: PORTABLE CHEST 1 VIEW COMPARISON:  04/01/2016 FINDINGS: Cardiomediastinal silhouette is stable. Left chest tube is unchanged in position. Subcutaneous emphysema left chest wall. No infiltrate or pulmonary edema. No pneumothorax. IMPRESSION: Stable left chest tube position. No pneumothorax. Subcutaneous emphysema left chest wall again noted. Electronically Signed   By: Natasha Mead M.D.   On: 04/02/2016 09:05       STUDIES:  Chest ct scan 12/4   CULTURES: Sputum 12/4 > MRSA 12/4 Blood 12/4 >   ANTIBIOTICS: Vanc 12/4 > 12/6 Aztreonam 12/4 > 12/6  Unasyn 12/6 > 12/8  LINES/TUBES:   DISCUSSION: Patient with severe COPD, on 3 L oxygen 24/7, with recent acute  exacerbation of COPD likely secondary to bronchitis/PNA, comes in after repeated falls over the weekend. Now with pneumothorax on the left lung base on chest x-ray. S/P L CT by [facility] on 12/4. Pt was briefly placed on water seal 12/5. CXR 12/6 with recurrent PTX, L. SOB better over all.   Assessment/Plan:  Acute on chronic hypoxemic respiratory failure secondary to acute exacerbation of COPD, significant pneumothorax in the left 2/2 fall, LLL infiltrate concerning for aspiration pna  S/P Chest tube on L, 12/4 by [facility] (Dr. [name]) Recurrent PTX, L, 12/6 after being briefly placed on  water seal 12/5.  - LOS 5 on 04/03/2016 with full expansion of left lung with suction on chest tube. On baseline 3L [place] currently  PKLAN - dc suction, recheck for ptx at 3pm. If lung still explanded can give clamping trial for 4h v defer till 04/04/16  - cont pulmicort nebs BID, duoneb QID - steroids to PO and taper off after 3 days - pt on 3L o2 at baseline.  Cut down FiO2 to keep O2 sats > 88%   Chronic Pain/Anxiety  - Pt has been taking  NORCO 7.5/325 mg  3x/day for the last 15 yrs. continue her on PO pain meds prn.- Cont Valium 5 mg PRN at HS.    Disposition/Code Status Pt is a full DNR.  I discussed with pt and her best friend on 12/6 and 12/7  re: her over all condition/prognosis.  I spoke with the patient and her  husband, Dr. Cyndia SkeetersLurey, on December 8th  and discussed with them plan of care. If her resp status worsens and her PTX worsens to the point that she needs another CT, she DOES NOT want another chest tube. At that point, pt wishes to be transitioned to comfort care.  On 04/03/16 - PCCM MD discussed again - exlained Rx for non-resoluton of PTx is pleurodesis and is not possible to let her live with chest tub and suction for indefinite perioids. Alternate can be home with smaller chest tube with drainage (least ideal). SHe is proceessing these but on 04/02/16 did express:  If pneumothorax re-occurs on water seal  Monday, pt may decide to discontinue chest tube and be transitioned to comfort care and go home. She is ready to die. Husband is aware of the plan.  DVT prophylaxis.   DISPIO Move to med surg. TRH primar from 04/04/16 with pccm consult. D/w Dr Isidoro Donningai  Dr. Kalman ShanMurali Machell Wirthlin, M.D., [facility] Center[facility].C.C.P Pulmonary and Critical Care Medicine Staff Physician [facility] Pager: [phone], If no answer or between  15:00h - 7:00h: call [phone]  04/03/2016 11:07 AM

## 2016-04-04 ENCOUNTER — Inpatient Hospital Stay (HOSPITAL_COMMUNITY): Payer: Medicare Other

## 2016-04-04 DIAGNOSIS — R0602 Shortness of breath: Secondary | ICD-10-CM

## 2016-04-04 DIAGNOSIS — J9622 Acute and chronic respiratory failure with hypercapnia: Secondary | ICD-10-CM

## 2016-04-04 DIAGNOSIS — J9311 Primary spontaneous pneumothorax: Secondary | ICD-10-CM

## 2016-04-04 DIAGNOSIS — J939 Pneumothorax, unspecified: Secondary | ICD-10-CM | POA: Diagnosis present

## 2016-04-04 LAB — PHOSPHORUS: Phosphorus: 3.6 mg/dL (ref 2.5–4.6)

## 2016-04-04 LAB — MAGNESIUM: MAGNESIUM: 1.8 mg/dL (ref 1.7–2.4)

## 2016-04-04 NOTE — Progress Notes (Signed)
PROGRESS NOTE    Cheyenne [name]  ZHY:[id number]  DOB: 06/07/1945  DOA: 03/29/2016 PCP: Cheyenne Alken, MD Outpatient Specialists:   Hospital course: Patient is a 70 year old female, being seen by Dr, [name] for severe COPD, on 3L O2 24/7,  former smoker, history of ETOH abuse, chronic pain, comes in with worsening SOB.  Patient was last seen by Dr. [name] at the office in October 2017. She was noted to be in mild flare for COPD and was given prednisone taper over one week and levofloxacin for 7 days. She improved and went back to baseline. During that office visit note, she mentioned about having a fall.  Pt admitted by [facility] service with a pneumothorax and COPD exacerbation s/p chest tube.  Transferred to [facility] 12/10.   Patient with severe COPD, on 3 L oxygen 24/7, with recent acute exacerbation of COPD likely secondary to bronchitis/PNA, comes in after repeated falls over >  pneumothorax on the left lung base on chest x-ray. S/P L CT by [facility] on 12/4  Assessment & Plan:   Acute on chronic hypoxemic respiratory failure secondary to acute exacerbation of COPD, significant pneumothorax in the left 2/2 fall, LLL infiltrate concerning for aspiration pna  - s/p chest tube left 12/4 by [facility] (Dr. [name]) - Recurrent PTX, L, 12/6 after being briefly placed on water seal 12/5 - on 12/10 with full expansion of left lung, on water seal on 2L/m oxygen (baseline 3L/m) - continue pulmicort nebs BID, duoneb QID - keep O2 sats >88% - Pulmonary team to consider pulling chest tube in AM.   Chronic pain and anxiety disorder Continue oral pain meds and valium as needed   DVT prophylaxis: heparin Code Status: DNR  Consultants:  PCCM  CTS  Subjective: Pt says that she is feeling much better now.   Objective: Vitals:   04/03/16 2034 04/04/16 0405 04/04/16 0939 04/04/16 1204  BP: (!) 102/58     Pulse: 97     Resp: 18     Temp: 98.5 F (36.9 C)     TempSrc: Oral     SpO2: 99% 97% 96%  97%  Weight:      Height:        Intake/Output Summary (Last 24 hours) at 04/04/16 1504 Last data filed at 04/04/16 0900  Gross per 24 hour  Intake              480 ml  Output              200 ml  Net              280 ml   Filed Weights   03/29/16 2311 04/03/16 1500  Weight: 45.4 kg (100 lb 1.4 oz) 47.1 kg (103 lb 13.4 oz)    Exam:  General exam: awake, alert, no distress, cooperative. Respiratory system: shallow but clear. No increased work of breathing. barrell chest left chest tube in place. Cardiovascular system: S1 & S2 heard, RRR. No JVD, murmurs, gallops, clicks or pedal edema. Gastrointestinal system: Abdomen is nondistended, soft and nontender. Normal bowel sounds heard. Central nervous system: Alert and oriented. No focal neurological deficits. Extremities: no CCE.  Data Reviewed: Basic Metabolic Panel:  Recent Labs Lab 03/29/16 1211 03/30/16 0339 04/02/16 0337 04/03/16 0343 04/04/16 0541  NA 133* 138 140 135  --   K 3.1* 3.2* 2.8* 3.2*  --   CL 92* 100* 92* 92*  --   CO2 32 31 39* 34*  --  GLUCOSE 106* 134* 109* 105*  --   BUN 7 12 8 9   --   CREATININE 0.46 0.47 0.36* 0.39*  --   CALCIUM 8.8* 8.4* 8.9 8.8*  --   MG  --  1.8 1.8  --  1.8  PHOS  --  2.8 2.7  --  3.6   Liver Function Tests:  Recent Labs Lab 03/29/16 1211 03/30/16 0339  AST 34 26  ALT 21 19  ALKPHOS 117 100  BILITOT 0.8 0.8  PROT 6.8 6.2*  ALBUMIN 3.9 3.4*   No results for input(s): LIPASE, AMYLASE in the last 168 hours. No results for input(s): AMMONIA in the last 168 hours. CBC:  Recent Labs Lab 03/29/16 1211 03/30/16 0339 04/02/16 0337 04/03/16 0343  WBC 14.7* 13.3* 13.2* 13.7*  NEUTROABS 11.9*  --   --   --   HGB 10.3* 9.9* 9.6* 10.1*  HCT 31.4* 31.3* 29.6* 31.7*  MCV 92.6 95.4 93.4 94.1  PLT 274 268 263 320   Cardiac Enzymes: No results for input(s): CKTOTAL, CKMB, CKMBINDEX, TROPONINI in the last 168 hours. CBG (last 3)  No results for input(s): GLUCAP in  the last 72 hours. Recent Results (from the past 240 hour(s))  Culture, blood (routine x 2)     Status: None   Collection Time: 03/29/16  4:51 PM  Result Value Ref Range Status   Specimen Description BLOOD LEFT ANTECUBITAL  Final   Special Requests BOTTLES DRAWN AEROBIC ONLY 5CC  Final   Culture   Final    NO GROWTH 5 DAYS Performed at [facility]    Report Status 04/03/2016 FINAL  Final  Culture, blood (routine x 2)     Status: None   Collection Time: 03/29/16  4:53 PM  Result Value Ref Range Status   Specimen Description BLOOD LEFT HAND  Final   Special Requests BOTTLES DRAWN AEROBIC ONLY 5CC  Final   Culture   Final    NO GROWTH 5 DAYS Performed at [facility]    Report Status 04/03/2016 FINAL  Final  MRSA PCR Screening     Status: None   Collection Time: 03/30/16 12:22 AM  Result Value Ref Range Status   MRSA by PCR NEGATIVE NEGATIVE Final    Comment:        The GeneXpert MRSA Assay (FDA approved for NASAL specimens only), is one component of a comprehensive MRSA colonization surveillance program. It is not intended to diagnose MRSA infection nor to guide or monitor treatment for MRSA infections.      Studies: Dg Chest Port 1 View  Result Date: 04/04/2016 CLINICAL DATA:  COPD.  Pneumothorax followup EXAM: PORTABLE CHEST 1 VIEW COMPARISON:  04/03/2016 FINDINGS: Left chest tube remains in place.  No pneumothorax. COPD with hyperinflation of the lungs. Right lower lobe mild airspace disease unchanged. Negative for heart failure or effusion. IMPRESSION: Left chest tube in place.  No pneumothorax No change right lower lobe airspace disease. Electronically Signed   By: Marlan Palauharles  Clark M.D.   On: 04/04/2016 06:54   Dg Chest Port 1 View  Result Date: 04/03/2016 CLINICAL DATA:  Followup LEFT pneumothorax EXAM: PORTABLE CHEST 1 VIEW COMPARISON:  Portable exam 1448 hours compared to 0455 hours FINDINGS: LEFT thoracostomy tube stable. Normal heart size,  mediastinal contours, and pulmonary vascularity. Lungs hyperinflated/emphysematous but clear. No acute infiltrate or pleural effusion. No pneumothorax. LEFT lateral chest wall emphysema again noted. Bones demineralized. IMPRESSION: LEFT chest tube without pneumothorax. Underlying emphysematous  changes. Electronically Signed   By: Ulyses SouthwardMark  Boles M.D.   On: 04/03/2016 15:10   Dg Chest Port 1 View  Result Date: 04/03/2016 CLINICAL DATA:  Patient with pneumothorax.  Re-evaluation. EXAM: PORTABLE CHEST 1 VIEW COMPARISON:  Chest radiograph 04/02/2016. FINDINGS: Monitoring leads overlie the patient. Normal cardiac and mediastinal contours. Emphysematous change. Left chest tube remains in place. No definite left-sided pneumothorax. Subcutaneous emphysema left chest wall. IMPRESSION: No definite pneumothorax.  Left chest tube in place. Electronically Signed   By: Annia Beltrew  Davis M.D.   On: 04/03/2016 07:01   Scheduled Meds: . budesonide (PULMICORT) nebulizer solution  0.5 mg Nebulization BID  . buPROPion  150 mg Oral Daily  . escitalopram  10 mg Oral Daily  . feeding supplement  1 Container Oral TID BM  . guaiFENesin  600 mg Oral BID  . heparin subcutaneous  5,000 Units Subcutaneous Q8H  . ipratropium-albuterol  3 mL Nebulization QID  . levothyroxine  50 mcg Oral Daily  . mouth rinse  15 mL Mouth Rinse BID  . multivitamin with minerals  1 tablet Oral Daily  . predniSONE  10 mg Oral Q breakfast  . sodium chloride flush  3 mL Intravenous Q12H   Continuous Infusions:  Active Problems:   Alcohol abuse   Respiratory failure with hypercapnia (HCC)   COPD exacerbation (HCC)   Hypokalemia   Pneumothorax   Chest tube in place  Time spent:   Standley Dakinslanford Beau Vanduzer, MD, FAAFP [facility] Pager [phone]  If 7PM-7AM, please contact night-coverage [email or web address] Password [email or web address] 04/04/2016, 3:04 PM    LOS: 6 days

## 2016-04-04 NOTE — Progress Notes (Signed)
PULMONARY / CRITICAL CARE MEDICINE   Name: Cheyenne [name] MRN: [id number] DOB: [birth year]    ADMISSION DATE:  03/29/2016 CONSULTATION DATE:  03/29/16  REFERRING MD:  Dr. Adela LankFloyd (EDP)  CHIEF COMPLAINT:  PTX related to fall.   BRIEF   Patient is a 70 year old female, being seen by Dr, [name] for severe COPD, on 3L O2 24/7,  former smoker, history of ETOH abuse, chronic pain, comes in with worsening SOB.  Patient was last seen by Dr. [name] at the office in October 2017. She was noted to be in mild flare for COPD and was given prednisone taper over one week and levofloxacin for 7 days. She improved and went back to baseline. During that office visit note, she mentioned about having a fall.  Patient had a fall on November 8 and went to the ER. She had a fall while on walking up the stairs. She fell forward and landed on her right elbow. She was diagnosed with right humeral fracture for which she had a cast placed. She has been doing rehabilitation as far as her humeral fracture is concerned.   Patient noticed worsening dyspnea in the last couple of weeks. It sounded like her COPD was starting to flare up again. The dyspnea was worse the last week. Started with cough, productive, maybe low-grade fever. On Saturday, she fell down again from the stairs, hitting her chest directly on the edge of a step. She started hurting on the left side of her chest. She did not go to the emergency room as she wanted to wait it out.  One night PTA she tried to get out of bed to use the bathroom but was unable to get on her feet and lost her balance. She fell again on her bottom and she called her husband. Still did not go to ED.   Because of her worsening dyspnea and cough, she decided to go to the emergency room. At the emergency room, chest x-ray showed moderate size pneumothorax. ED physician consulted [facility] and PCCM.     SIGNIFICANT EVENTS: 12/4 admit for fall with significant PTX. CT placed by [facility].  12/8 -  clinically improved.  Still SOB but not as bad as admit day. No air leak from ct   SUBJECTIVE/OVERNIGHT/INTERVAL HX Anxious re pulling L Chest tube but no pain/ sob   VITAL SIGNS: BP (!) 102/58 (BP Location: Left Arm)   Pulse 97   Temp 98.5 F (36.9 C) (Oral)   Resp 18   Ht 5\' 1"  (1.549 m)   Wt 103 lb 13.4 oz (47.1 kg)   SpO2 97%   BMI 19.62 kg/m   FIO2   2lpm NP  HEMODYNAMICS:    VENTILATOR SETTINGS:    INTAKE / OUTPUT: I/O last 3 completed shifts: In: 350 [P.O.:240; Other:110] Out: 1325 [Urine:1325]  PHYSICAL EXAMINATION: General:  Awake, Oriented x 3. Comfortable. Lying almost flat in bed  Neuro:  CN grossly intact. (-) lateralizing signs HEENT: oropharynx clear/ no thrush  Cardiovascular:  Good s1/s2. (-) s3/m/r/g Lungs:  (-) accessory muscle use. (-) subq air or crepitus.  (-) wheeze. Crackles at bases. Chest tube on water seal, no a/f shift with coughing  cm Water,  BARRELL CHEST + Abdomen:  (+) BS, soft, NT. Flat. (-) masses/tenderness.  Musculoskeletal:  (-) edema/clubbing/cyanosi. (+) cast in her RUE Skin:  Warm and dry. (-) rash.   LABS: PULMONARY  Recent Labs Lab 03/29/16 2038  PHART 7.315*  PCO2ART 65.2*  PO2ART 79.7*  HCO3 32.4*  O2SAT 95.1    CBC  Recent Labs Lab 03/30/16 0339 04/02/16 0337 04/03/16 0343  HGB 9.9* 9.6* 10.1*  HCT 31.3* 29.6* 31.7*  WBC 13.3* 13.2* 13.7*  PLT 268 263 320    COAGULATION No results for input(s): INR in the last 168 hours.  CARDIAC  No results for input(s): TROPONINI in the last 168 hours. No results for input(s): PROBNP in the last 168 hours.   CHEMISTRY  Recent Labs Lab 03/29/16 1211 03/30/16 0339 04/02/16 0337 04/03/16 0343 04/04/16 0541  NA 133* 138 140 135  --   K 3.1* 3.2* 2.8* 3.2*  --   CL 92* 100* 92* 92*  --   CO2 32 31 39* 34*  --   GLUCOSE 106* 134* 109* 105*  --   BUN 7 12 8 9   --   CREATININE 0.46 0.47 0.36* 0.39*  --   CALCIUM 8.8* 8.4* 8.9 8.8*  --   MG  --  1.8 1.8   --  1.8  PHOS  --  2.8 2.7  --  3.6   Estimated Creatinine Clearance: 49.3 mL/min (by C-G formula based on SCr of 0.39 mg/dL (L)).   LIVER  Recent Labs Lab 03/29/16 1211 03/30/16 0339  AST 34 26  ALT 21 19  ALKPHOS 117 100  BILITOT 0.8 0.8  PROT 6.8 6.2*  ALBUMIN 3.9 3.4*     INFECTIOUS  Recent Labs Lab 03/30/16 0339 04/01/16 0327 04/03/16 0343  PROCALCITON <0.10 <0.10 <0.10     ENDOCRINE CBG (last 3)  No results for input(s): GLUCAP in the last 72 hours.       IMAGING x48h  - image(s) personally visualized  -   highlighted in bold Dg Chest Port 1 View  Result Date: 04/04/2016 CLINICAL DATA:  COPD.  Pneumothorax followup EXAM: PORTABLE CHEST 1 VIEW COMPARISON:  04/03/2016 FINDINGS: Left chest tube remains in place.  No pneumothorax. COPD with hyperinflation of the lungs. Right lower lobe mild airspace disease unchanged. Negative for heart failure or effusion. IMPRESSION: Left chest tube in place.  No pneumothorax No change right lower lobe airspace disease. Electronically Signed   By: Marlan Palau M.D.   On: 04/04/2016 06:54   Dg Chest Port 1 View  Result Date: 04/03/2016 CLINICAL DATA:  Followup LEFT pneumothorax EXAM: PORTABLE CHEST 1 VIEW COMPARISON:  Portable exam 1448 hours compared to 0455 hours FINDINGS: LEFT thoracostomy tube stable. Normal heart size, mediastinal contours, and pulmonary vascularity. Lungs hyperinflated/emphysematous but clear. No acute infiltrate or pleural effusion. No pneumothorax. LEFT lateral chest wall emphysema again noted. Bones demineralized. IMPRESSION: LEFT chest tube without pneumothorax. Underlying emphysematous changes. Electronically Signed   By: Ulyses Southward M.D.   On: 04/03/2016 15:10   Dg Chest Port 1 View  Result Date: 04/03/2016 CLINICAL DATA:  Patient with pneumothorax.  Re-evaluation. EXAM: PORTABLE CHEST 1 VIEW COMPARISON:  Chest radiograph 04/02/2016. FINDINGS: Monitoring leads overlie the patient. Normal cardiac  and mediastinal contours. Emphysematous change. Left chest tube remains in place. No definite left-sided pneumothorax. Subcutaneous emphysema left chest wall. IMPRESSION: No definite pneumothorax.  Left chest tube in place. Electronically Signed   By: Annia Belt M.D.   On: 04/03/2016 07:01       STUDIES:  Chest ct scan 12/4   CULTURES: Sputum 12/4 > ? Not processed  MRSA 12/4 neg nasal screen  Blood 12/4 > neg Urine Strep 12/4 > neg   ANTIBIOTICS: Vanc 12/4 > 12/6 Aztreonam 12/4 >  12/6  Unasyn 12/6 > 12/8  LINES/TUBES:   DISCUSSION: Patient with severe COPD, on 3 L oxygen 24/7, with recent acute exacerbation of COPD likely secondary to bronchitis/PNA, comes in after repeated falls over >  pneumothorax on the left lung base on chest x-ray. S/P L CT by [facility] on 12/4.     Assessment/Plan:  Acute on chronic hypoxemic respiratory failure secondary to acute exacerbation of COPD, significant pneumothorax in the left 2/2 fall, LLL infiltrate concerning for aspiration pna  S/P Chest tube on L, 12/4 by [facility] (Dr. Tyrone SageGerhardt) Recurrent PTX, L, 12/6 after being briefly placed on water seal 12/5.  - LOS 6 on 04/04/2016 with full expansion of left lung/ ok on water seal On 2lpm now vs baseline 3lpm/ sats ok   PKLAN - cont pulmicort nebs BID, duoneb QID - steroids to PO and taper off as tol  -   Cut down FiO2 to keep O2 sats > 88% - could pull chest tube now but pt very reluctant > will do in am    Chronic Pain/Anxiety  - Pt has been taking  NORCO 7.5/325 mg  3x/day for the last 15 yrs. continue her on PO pain meds prn.- Cont Valium 5 mg PRN at HS.     DVT prophylaxis.      Sandrea HughsMichael Felecia Stanfill, MD Pulmonary and Critical Care Medicine [facility] Cell [phone] After 5:30 PM or weekends, call [phone]

## 2016-04-05 ENCOUNTER — Inpatient Hospital Stay (HOSPITAL_COMMUNITY): Payer: Medicare Other

## 2016-04-05 LAB — BASIC METABOLIC PANEL
Anion gap: 9 (ref 5–15)
BUN: 6 mg/dL (ref 6–20)
CHLORIDE: 89 mmol/L — AB (ref 101–111)
CO2: 37 mmol/L — AB (ref 22–32)
CREATININE: 0.5 mg/dL (ref 0.44–1.00)
Calcium: 9 mg/dL (ref 8.9–10.3)
GFR calc Af Amer: >60 mL/min (ref >60)
GFR calc non Af Amer: >60 mL/min (ref >60)
Glucose, Bld: 104 mg/dL — ABNORMAL HIGH (ref 65–99)
Potassium: 2.7 mmol/L — CL (ref 3.5–5.1)
SODIUM: 135 mmol/L (ref 135–145)

## 2016-04-05 LAB — CBC
HCT: 31.3 % — ABNORMAL LOW (ref 36.0–46.0)
HEMOGLOBIN: 10.3 g/dL — AB (ref 12.0–15.0)
MCH: 30.4 pg (ref 26.0–34.0)
MCHC: 32.9 g/dL (ref 30.0–36.0)
MCV: 92.3 fL (ref 78.0–100.0)
Platelets: 380 10*3/uL (ref 150–400)
RBC: 3.39 MIL/uL — ABNORMAL LOW (ref 3.87–5.11)
RDW: 12.6 % (ref 11.5–15.5)
WBC: 14.9 10*3/uL — ABNORMAL HIGH (ref 4.0–10.5)

## 2016-04-05 LAB — MAGNESIUM: MAGNESIUM: 1.8 mg/dL (ref 1.7–2.4)

## 2016-04-05 LAB — PHOSPHORUS: Phosphorus: 3.5 mg/dL (ref 2.5–4.6)

## 2016-04-05 MED ORDER — MAGNESIUM SULFATE IN D5W 1-5 GM/100ML-% IV SOLN
1.0000 g | Freq: Once | INTRAVENOUS | Status: AC
  Administered 2016-04-05: 1 g via INTRAVENOUS
  Filled 2016-04-05: qty 100

## 2016-04-05 MED ORDER — POTASSIUM CHLORIDE CRYS ER 20 MEQ PO TBCR
60.0000 meq | EXTENDED_RELEASE_TABLET | Freq: Two times a day (BID) | ORAL | Status: AC
  Administered 2016-04-05 – 2016-04-06 (×4): 60 meq via ORAL
  Filled 2016-04-05 (×4): qty 3

## 2016-04-05 MED ORDER — DIAZEPAM 5 MG PO TABS
5.0000 mg | ORAL_TABLET | Freq: Every evening | ORAL | Status: DC | PRN
  Administered 2016-04-05 – 2016-04-12 (×5): 5 mg via ORAL
  Filled 2016-04-05 (×5): qty 1

## 2016-04-05 NOTE — Care Management Important Message (Signed)
Important Message  Patient Details  Name: Cheyenne [name] MRN: [id number] Date of Birth: 05/09/1945   Medicare Important Message Given:       Haskell FlirtJamison, Almedia Cordell 04/05/2016, 2:47 PMImportant Message  Patient Details  Name: Cheyenne [name] MRN: [id number] Date of Birth: 05/09/1945   Medicare Important Message Given:       Haskell FlirtJamison, Jheri Mitter 04/05/2016, 2:47 PM

## 2016-04-05 NOTE — Progress Notes (Signed)
PT Cancellation Note  Patient Details Name: Cheyenne MassedRevauda S [name] MRN: [id number] DOB: [birth year]   Cancelled Treatment:    Reason Eval/Treat Not Completed: Medical issues which prohibited therapy (noted K 2.7. check back later for clearance/or tomorrow.)   Rada HayHill, Dajanay Northrup [name] 04/05/2016, 11:51 AM Blanchard KelchKaren Gertha Lichtenberg PT [phone]

## 2016-04-05 NOTE — Progress Notes (Addendum)
CRITICAL VALUE ALERT  Critical value received:  Potassium 2.7  Date of notification:  04-05-16  Time of notification:  7:50am  Critical value read back: 2.7  Nurse who received alert:  Silvano BilisKiristin Reyaan Thoma, RN  MD notified (1st page):  Dr. Laural BenesJohnson  Time of first page:  8am  MD notified (2nd page):  Time of second page:  Responding MD:  Dr. Laural BenesJohnson  Time MD responded:  8:05

## 2016-04-05 NOTE — Progress Notes (Signed)
PROGRESS NOTE   Cheyenne [name]  ZOX:[id number]  DOB: [birth year]  DOA: 03/29/2016 PCP: [name], MD Outpatient Specialists:  Hospital course: Patient is a 70 year old female, being seen by Dr, [name] for severe COPD, on 3L O2 24/7,  former smoker, history of ETOH abuse, chronic pain, comes in with worsening SOB.  Patient was last seen by Dr. [name] at the office in October 2017. She was noted to be in mild flare for COPD and was given prednisone taper over one week and levofloxacin for 7 days. She improved and went back to baseline. During that office visit note, she mentioned about having a fall.  Pt admitted by [facility] service with a pneumothorax and COPD exacerbation s/p chest tube.  Transferred to [facility] 12/10.   Patient with severe COPD, on 3 L oxygen 24/7, with recent acute exacerbation of COPD likely secondary to bronchitis/PNA, comes in after repeated falls over >  pneumothorax on the left lung base on chest x-ray. S/P L CT by [facility] on 12/4  Assessment & Plan:   Acute on chronic hypoxemic respiratory failure secondary to acute exacerbation of COPD, significant pneumothorax in the left 2/2 fall, LLL infiltrate concerning for aspiration pna  - s/p chest tube left 12/4 by [facility] (Dr. [name]), chest tube removed 12/11 - Recurrent PTX, L, 12/6 after being briefly placed on water seal 12/5 - on 12/10 with full expansion of left lung, on water seal on 2L/m oxygen (baseline 3L/m) - continue pulmicort nebs BID, duoneb QID - keep O2 sats >88% - Pulmonary team following  Chronic pain and anxiety disorder Continue oral pain meds and valium as needed   DVT prophylaxis: heparin Code Status: DNR  Consultants:  PCCM  CTS  Subjective: Pt says that she is feeling better   Objective: Vitals:   04/04/16 2111 04/05/16 0515 04/05/16 0845 04/05/16 1225  BP:  135/72    Pulse:  93    Resp:  18    Temp:  98.4 F (36.9 C)    TempSrc:  Oral    SpO2: 95% 97% 94% 96%  Weight:        Height:        Intake/Output Summary (Last 24 hours) at 04/05/16 1427 Last data filed at 04/05/16 1000  Gross per 24 hour  Intake              183 ml  Output                0 ml  Net              183 ml   Filed Weights   03/29/16 2311 04/03/16 1500  Weight: 45.4 kg (100 lb 1.4 oz) 47.1 kg (103 lb 13.4 oz)    Exam:  General exam: awake, alert, no distress, cooperative. Respiratory system: shallow but clear. No increased work of breathing. barrell chest.  Cardiovascular system: S1 & S2 heard, RRR. No JVD, murmurs, gallops, clicks or pedal edema. Gastrointestinal system: Abdomen is nondistended, soft and nontender. Normal bowel sounds heard. Central nervous system: Alert and oriented. No focal neurological deficits. Extremities: no CCE.  Data Reviewed: Basic Metabolic Panel:  Recent Labs Lab 03/30/16 0339 04/02/16 0337 04/03/16 0343 04/04/16 0541 04/05/16 0656  NA 138 140 135  --  135  K 3.2* 2.8* 3.2*  --  2.7*  CL 100* 92* 92*  --  89*  CO2 31 39* 34*  --  37*  GLUCOSE 134* 109* 105*  --  104*  BUN 12 8 9   --  6  CREATININE 0.47 0.36* 0.39*  --  0.50  CALCIUM 8.4* 8.9 8.8*  --  9.0  MG 1.8 1.8  --  1.8 1.8  PHOS 2.8 2.7  --  3.6 3.5   Liver Function Tests:  Recent Labs Lab 03/30/16 0339  AST 26  ALT 19  ALKPHOS 100  BILITOT 0.8  PROT 6.2*  ALBUMIN 3.4*   No results for input(s): LIPASE, AMYLASE in the last 168 hours. No results for input(s): AMMONIA in the last 168 hours. CBC:  Recent Labs Lab 03/30/16 0339 04/02/16 0337 04/03/16 0343 04/05/16 0536  WBC 13.3* 13.2* 13.7* 14.9*  HGB 9.9* 9.6* 10.1* 10.3*  HCT 31.3* 29.6* 31.7* 31.3*  MCV 95.4 93.4 94.1 92.3  PLT 268 263 320 380   Cardiac Enzymes: No results for input(s): CKTOTAL, CKMB, CKMBINDEX, TROPONINI in the last 168 hours. CBG (last 3)  No results for input(s): GLUCAP in the last 72 hours. Recent Results (from the past 240 hour(s))  Culture, blood (routine x 2)     Status: None    Collection Time: 03/29/16  4:51 PM  Result Value Ref Range Status   Specimen Description BLOOD LEFT ANTECUBITAL  Final   Special Requests BOTTLES DRAWN AEROBIC ONLY 5CC  Final   Culture   Final    NO GROWTH 5 DAYS Performed at [facility]    Report Status 04/03/2016 FINAL  Final  Culture, blood (routine x 2)     Status: None   Collection Time: 03/29/16  4:53 PM  Result Value Ref Range Status   Specimen Description BLOOD LEFT HAND  Final   Special Requests BOTTLES DRAWN AEROBIC ONLY 5CC  Final   Culture   Final    NO GROWTH 5 DAYS Performed at [facility]    Report Status 04/03/2016 FINAL  Final  MRSA PCR Screening     Status: None   Collection Time: 03/30/16 12:22 AM  Result Value Ref Range Status   MRSA by PCR NEGATIVE NEGATIVE Final    Comment:        The GeneXpert MRSA Assay (FDA approved for NASAL specimens only), is one component of a comprehensive MRSA colonization surveillance program. It is not intended to diagnose MRSA infection nor to guide or monitor treatment for MRSA infections.     Studies: Dg Chest Port 1 View  Result Date: 04/04/2016 CLINICAL DATA:  COPD.  Pneumothorax followup EXAM: PORTABLE CHEST 1 VIEW COMPARISON:  04/03/2016 FINDINGS: Left chest tube remains in place.  No pneumothorax. COPD with hyperinflation of the lungs. Right lower lobe mild airspace disease unchanged. Negative for heart failure or effusion. IMPRESSION: Left chest tube in place.  No pneumothorax No change right lower lobe airspace disease. Electronically Signed   By: Marlan Palauharles  Clark M.D.   On: 04/04/2016 06:54   Dg Chest Port 1 View  Result Date: 04/03/2016 CLINICAL DATA:  Followup LEFT pneumothorax EXAM: PORTABLE CHEST 1 VIEW COMPARISON:  Portable exam 1448 hours compared to 0455 hours FINDINGS: LEFT thoracostomy tube stable. Normal heart size, mediastinal contours, and pulmonary vascularity. Lungs hyperinflated/emphysematous but clear. No acute infiltrate or pleural  effusion. No pneumothorax. LEFT lateral chest wall emphysema again noted. Bones demineralized. IMPRESSION: LEFT chest tube without pneumothorax. Underlying emphysematous changes. Electronically Signed   By: Ulyses SouthwardMark  Boles M.D.   On: 04/03/2016 15:10   Scheduled Meds: . budesonide (PULMICORT) nebulizer solution  0.5 mg Nebulization BID  . buPROPion  150  mg Oral Daily  . escitalopram  10 mg Oral Daily  . feeding supplement  1 Container Oral TID BM  . guaiFENesin  600 mg Oral BID  . heparin subcutaneous  5,000 Units Subcutaneous Q8H  . ipratropium-albuterol  3 mL Nebulization QID  . levothyroxine  50 mcg Oral Daily  . mouth rinse  15 mL Mouth Rinse BID  . multivitamin with minerals  1 tablet Oral Daily  . potassium chloride  60 mEq Oral BID  . sodium chloride flush  3 mL Intravenous Q12H   Continuous Infusions:  Active Problems:   Alcohol abuse   Respiratory failure with hypercapnia (HCC)   COPD exacerbation (HCC)   Hypokalemia   Pneumothorax   Chest tube in place   Pneumothorax on left   Shortness of breath  Time spent:   Standley Dakinslanford Johnson, MD, FAAFP [facility] Pager [phone]  If 7PM-7AM, please contact night-coverage [email or web address] Password [email or web address] 04/05/2016, 2:27 PM    LOS: 7 days

## 2016-04-05 NOTE — Progress Notes (Signed)
PULMONARY / CRITICAL CARE MEDICINE   Name: Cheyenne [name] MRN: [id number] DOB: [birth year]    ADMISSION DATE:  03/29/2016 CONSULTATION DATE:  03/29/16  REFERRING MD:  Dr. Adela LankFloyd (EDP)  CHIEF COMPLAINT:  PTX related to fall.   BRIEF   70 year old female followed by Dr. [name] for severe COPD on 3 L O2 presented on 03/29/2016 with a pneumothorax.    SIGNIFICANT EVENTS: 12/4 admit for fall with significant PTX. CT placed by [facility].  12/8 - clinically improved.  Still SOB but not as bad as admit day. No air leak from ct    SUBJECTIVE/OVERNIGHT/INTERVAL HX No pain, breathing improved Daughter concerned about intermittent sweating and chills  VITAL SIGNS: BP 135/72 (BP Location: Left Arm)   Pulse 93   Temp 98.4 F (36.9 C) (Oral)   Resp 18   Ht 5\' 1"  (1.549 m)   Wt 103 lb 13.4 oz (47.1 kg)   SpO2 96%   BMI 19.62 kg/m   FIO2   2lpm NP  HEMODYNAMICS:    VENTILATOR SETTINGS:    INTAKE / OUTPUT: I/O last 3 completed shifts: In: 480 [P.O.:480] Out: -   PHYSICAL EXAMINATION: General:  Comfortable in bed HEENT normocephalic atraumatic, oropharynx clear Pulmonary diminished breath sounds bilaterally, increased AP diameter Cardiovascular regular rate and rhythm, no murmurs gallops or rubs Derm chest tube in place left side Neurologic: Awake, alert, oriented 4  LABS: PULMONARY  Recent Labs Lab 03/29/16 2038  PHART 7.315*  PCO2ART 65.2*  PO2ART 79.7*  HCO3 32.4*  O2SAT 95.1    CBC  Recent Labs Lab 04/02/16 0337 04/03/16 0343 04/05/16 0536  HGB 9.6* 10.1* 10.3*  HCT 29.6* 31.7* 31.3*  WBC 13.2* 13.7* 14.9*  PLT 263 320 380    COAGULATION No results for input(s): INR in the last 168 hours.  CARDIAC  No results for input(s): TROPONINI in the last 168 hours. No results for input(s): PROBNP in the last 168 hours.   CHEMISTRY  Recent Labs Lab 03/30/16 0339 04/02/16 0337 04/03/16 0343 04/04/16 0541 04/05/16 0656  NA 138 140 135  --  135  K  3.2* 2.8* 3.2*  --  2.7*  CL 100* 92* 92*  --  89*  CO2 31 39* 34*  --  37*  GLUCOSE 134* 109* 105*  --  104*  BUN 12 8 9   --  6  CREATININE 0.47 0.36* 0.39*  --  0.50  CALCIUM 8.4* 8.9 8.8*  --  9.0  MG 1.8 1.8  --  1.8 1.8  PHOS 2.8 2.7  --  3.6 3.5   Estimated Creatinine Clearance: 49.3 mL/min (by C-G formula based on SCr of 0.5 mg/dL).   LIVER  Recent Labs Lab 03/30/16 0339  AST 26  ALT 19  ALKPHOS 100  BILITOT 0.8  PROT 6.2*  ALBUMIN 3.4*     INFECTIOUS  Recent Labs Lab 03/30/16 0339 04/01/16 0327 04/03/16 0343  PROCALCITON <0.10 <0.10 <0.10     ENDOCRINE CBG (last 3)  No results for input(s): GLUCAP in the last 72 hours.       IMAGING x48h  - image(s) personally visualized  -   highlighted in bold Dg Chest Port 1 View  Result Date: 04/04/2016 CLINICAL DATA:  COPD.  Pneumothorax followup EXAM: PORTABLE CHEST 1 VIEW COMPARISON:  04/03/2016 FINDINGS: Left chest tube remains in place.  No pneumothorax. COPD with hyperinflation of the lungs. Right lower lobe mild airspace disease unchanged. Negative for heart failure or effusion. IMPRESSION:  Left chest tube in place.  No pneumothorax No change right lower lobe airspace disease. Electronically Signed   By: Marlan Palauharles  Clark M.D.   On: 04/04/2016 06:54   Dg Chest Port 1 View  Result Date: 04/03/2016 CLINICAL DATA:  Followup LEFT pneumothorax EXAM: PORTABLE CHEST 1 VIEW COMPARISON:  Portable exam 1448 hours compared to 0455 hours FINDINGS: LEFT thoracostomy tube stable. Normal heart size, mediastinal contours, and pulmonary vascularity. Lungs hyperinflated/emphysematous but clear. No acute infiltrate or pleural effusion. No pneumothorax. LEFT lateral chest wall emphysema again noted. Bones demineralized. IMPRESSION: LEFT chest tube without pneumothorax. Underlying emphysematous changes. Electronically Signed   By: Ulyses SouthwardMark  Boles M.D.   On: 04/03/2016 15:10       STUDIES:  Chest ct scan 12/4   CULTURES: Sputum  12/4 > ? Not processed  MRSA 12/4 neg nasal screen  Blood 12/4 > neg Urine Strep 12/4 > neg   ANTIBIOTICS: Vanc 12/4 > 12/6 Aztreonam 12/4 > 12/6  Unasyn 12/6 > 12/8  LINES/TUBES:   DISCUSSION: Patient with severe COPD, on 3 L oxygen 24/7, with recent acute exacerbation of COPD likely secondary to bronchitis/PNA, comes in after repeated falls over >  pneumothorax on the left lung base on chest x-ray. S/P L CT by [facility] on 12/4.     Assessment/Plan:  Acute on chronic hypoxemic respiratory failure secondary to acute exacerbation of COPD, significant pneumothorax in the left 2/2 fall, LLL infiltrate concerning for aspiration pna  S/P Chest tube on L, 12/4 by [facility] (Dr. Tyrone SageGerhardt) Recurrent PTX, L, 12/6 after being briefly placed on water seal 12/5  - LOS 7 on 04/05/2016 with full expansion of left lung/ ok on water seal On 2lpm now vs baseline 3lpm/ sats ok   PLAN - cont pulmicort nebs BID, duoneb QID - Taper off prednisone over 5 days  -   Titrate down FiO2 to keep O2 saturation greater than 88% - Remove chest tube today   Daughter updated at bedside by me Rest as per care from primary service  Heber CarolinaBrent Chanette Demo, MD [facility] Pager: [phone] Cell: [phone] After 3pm or if no response, call [phone]

## 2016-04-05 NOTE — Progress Notes (Signed)
Chest tube discontinued.  Vaseline gauze + dry gauze applied over site.  Tape applied to secure site.    Plan: Repeat CXR in 4 hours to assess for pneumothorax Keep site covered at all times with vaseline gauze near site until well healed.     Canary BrimBrandi Monifa Blanchette, NP-C [facility] Pgr: [phone] or if no answer [phone] 04/05/2016, 2:26 PM

## 2016-04-05 NOTE — Progress Notes (Signed)
OT Cancellation Note  Patient Details Name: Cheyenne [name] MRN: [id number] DOB: Nov 11, 1945   Cancelled Treatment:    Reason Eval/Treat Not Completed: Medical issues which prohibited therapy  Noted potassium level 2.7.  Will check on pt as level appropriate.  Alba CoryREDDING, Lysbeth Dicola D 04/05/2016, 11:32 AM

## 2016-04-05 NOTE — Progress Notes (Addendum)
eLink Physician-Brief Progress Note Patient Name: Cheyenne MassedRevauda S [name] DOB: 03-25-1946 MRN: [id number]   Date of Service  04/05/2016  HPI/Events of Note  Minimal L apical residual pneumothorax s/p chest tube removal.   eICU Interventions  Continue present management. Follow up CXR in AM.     Intervention Category Intermediate Interventions: Diagnostic test evaluation  Sommer,Steven Eugene 04/05/2016, 7:22 PM

## 2016-04-06 ENCOUNTER — Inpatient Hospital Stay (HOSPITAL_COMMUNITY): Payer: Medicare Other

## 2016-04-06 DIAGNOSIS — J939 Pneumothorax, unspecified: Secondary | ICD-10-CM

## 2016-04-06 DIAGNOSIS — E440 Moderate protein-calorie malnutrition: Secondary | ICD-10-CM

## 2016-04-06 LAB — CBC
HCT: 34 % — ABNORMAL LOW (ref 36.0–46.0)
Hemoglobin: 11.2 g/dL — ABNORMAL LOW (ref 12.0–15.0)
MCH: 30 pg (ref 26.0–34.0)
MCHC: 32.9 g/dL (ref 30.0–36.0)
MCV: 91.2 fL (ref 78.0–100.0)
PLATELETS: 447 10*3/uL — AB (ref 150–400)
RBC: 3.73 MIL/uL — ABNORMAL LOW (ref 3.87–5.11)
RDW: 12.6 % (ref 11.5–15.5)
WBC: 14.5 10*3/uL — AB (ref 4.0–10.5)

## 2016-04-06 LAB — BASIC METABOLIC PANEL
Anion gap: 8 (ref 5–15)
BUN: 8 mg/dL (ref 6–20)
CHLORIDE: 95 mmol/L — AB (ref 101–111)
CO2: 33 mmol/L — ABNORMAL HIGH (ref 22–32)
CREATININE: 0.41 mg/dL — AB (ref 0.44–1.00)
Calcium: 9.3 mg/dL (ref 8.9–10.3)
GFR calc Af Amer: >60 mL/min (ref >60)
GFR calc non Af Amer: >60 mL/min (ref >60)
GLUCOSE: 101 mg/dL — AB (ref 65–99)
Potassium: 4.8 mmol/L (ref 3.5–5.1)
Sodium: 136 mmol/L (ref 135–145)

## 2016-04-06 LAB — PHOSPHORUS: Phosphorus: 3.5 mg/dL (ref 2.5–4.6)

## 2016-04-06 LAB — MAGNESIUM: Magnesium: 2.2 mg/dL (ref 1.7–2.4)

## 2016-04-06 MED ORDER — BOOST / RESOURCE BREEZE PO LIQD
1.0000 | Freq: Three times a day (TID) | ORAL | 0 refills | Status: AC
Start: 2016-04-06 — End: ?

## 2016-04-06 MED ORDER — ADULT MULTIVITAMIN W/MINERALS CH
1.0000 | ORAL_TABLET | Freq: Every day | ORAL | Status: AC
Start: 2016-04-07 — End: ?

## 2016-04-06 MED ORDER — OXYCODONE HCL 5 MG PO TABS
5.0000 mg | ORAL_TABLET | ORAL | Status: DC | PRN
  Administered 2016-04-06 – 2016-04-11 (×13): 5 mg via ORAL
  Filled 2016-04-06 (×14): qty 1

## 2016-04-06 MED ORDER — HYDROCODONE-ACETAMINOPHEN 7.5-325 MG PO TABS: 1.0000 | ORAL_TABLET | Freq: Three times a day (TID) | ORAL | 0 refills | Status: DC | PRN

## 2016-04-06 MED ORDER — OXYCODONE-ACETAMINOPHEN 5-325 MG PO TABS
1.0000 | ORAL_TABLET | ORAL | Status: DC | PRN
  Administered 2016-04-07 – 2016-04-08 (×3): 1 via ORAL
  Filled 2016-04-06 (×5): qty 1

## 2016-04-06 NOTE — Progress Notes (Signed)
Date: April 06, 2016 Discharge orders checked for needs. No case management needs present at time of discharge. Rhonda Davis, RN, BSN, CCM   [phone] 

## 2016-04-06 NOTE — Discharge Summary (Signed)
Physician Discharge Summary  Cheyenne [name] ZOX:[id number] DOB: 04-29-1945 DOA: 03/29/2016  PCP: [name], MD  Admit date: 03/29/2016 Discharge date: 04/06/2016  Admitted From: Home  Disposition:  Home   Recommendations for Outpatient Follow-up:  1. Follow up with PCP in 1 weeks 2. Follow up with Dr [name] in 2 weeks 3. Follow up with Dr. Janee Morn in 1 month 4. Please obtain BMP/CBC in one week 5. Please follow up on the following pending results:  Discharge Condition: STABLE CODE STATUS: DNR  Brief/Interim Summary: Patient is a 70 year old female, being seen by Dr, [name] for severe COPD, on 3L O2 24/7, former smoker, history of ETOH abuse, chronic pain, comes in with worsening SOB. Patient was last seen by Dr. [name] at the office in October 2017. She was noted to be in mild flare for COPD and was given prednisone taper over one week and levofloxacin for 7 days. She improved and went back to baseline. During that office visit note, she mentioned about having a fall.  Pt admitted by [facility] service with a pneumothorax and COPD exacerbation s/p chest tube.  Transferred to [facility] 12/10.   Patient with severe COPD, on 3 L oxygen 24/7, with recent acute exacerbation of COPD likely secondary to bronchitis/PNA, comes in after repeated falls over >pneumothorax on the left lung base on chest x-ray. S/P L CT by [facility] on 12/4  Assessment & Plan:   Acute on chronic hypoxemic respiratory failure secondary to acute exacerbation of COPD, significant pneumothorax in the left 2/2 fall - s/p chest tube left 12/4 by [facility] (Dr. [name]), chest tube removed 12/11, repeat films good, no recurrence. - Recurrent PTX, L, 12/6 after being briefly placed on water seal 12/5 - on 12/10 with full expansion of left lung, on water seal on 2L/m oxygen (baseline 3L/m) - in hospital used pulmicort nebs BID, duoneb QID, resume home respiratory treatments at discharge - keep O2 sats >88% - Pulmonary team  following, chest tube removed, repeat films good, given clearance to discharge home with outpatient follow up with Dr. [name] in 2 weeks  Chronic pain and anxiety disorder Continue oral pain meds  Right humerus fracture Ptto follow up with Dr. Janee Morn in 1 month.  He reviewed her films in hospital.   DVT prophylaxis: heparin Code Status: DNR  Consultants:  PCCM  CTS  Discharge Diagnoses:  Active Problems:   Alcohol abuse   Respiratory failure with hypercapnia (HCC)   COPD exacerbation (HCC)   Hypokalemia   Pneumothorax   Chest tube in place   Pneumothorax on left   Shortness of breath   Malnutrition of moderate degree  Discharge Instructions  Discharge Instructions    Increase activity slowly    Complete by:  As directed        Medication List    STOP taking these medications   levofloxacin 500 MG tablet Commonly known as:  LEVAQUIN   MUCINEX SINUS-MAX DAY/NIGHT Liquid Misc Generic drug:  PE-GG-APAP & PE-DPH-APAP   oxyCODONE 5 MG immediate release tablet Commonly known as:  Oxy IR/ROXICODONE   oxyCODONE-acetaminophen 5-325 MG tablet Commonly known as:  PERCOCET/ROXICET   predniSONE 10 MG tablet Commonly known as:  DELTASONE     TAKE these medications   BREO ELLIPTA 100-25 MCG/INH Aepb Generic drug:  fluticasone furoate-vilanterol INHALE ONE PUFF INTO THE LUNGS ONCE DAILY   buPROPion 150 MG 24 hr tablet Commonly known as:  WELLBUTRIN XL Take 150 mg by mouth daily.   calcium carbonate 600  MG Tabs tablet Commonly known as:  OS-CAL Take 600 mg by mouth daily.   escitalopram 10 MG tablet Commonly known as:  LEXAPRO Take 10 mg by mouth daily.   feeding supplement Liqd Take 1 Container by mouth 3 (three) times daily between meals.   Fish Oil 1000 MG Caps Take 1 capsule by mouth every evening.   fluticasone 50 MCG/ACT nasal spray Commonly known as:  FLONASE PLACE 2 SPRAYS INTO BOTH NOSTRILS DAILY What changed:  See the new instructions.    FLUTTER Devi Use as directed   glucosamine-chondroitin 500-400 MG tablet Take 1 tablet by mouth 2 (two) times daily.   HYDROcodone-acetaminophen 7.5-325 MG tablet Commonly known as:  NORCO Take 1 tablet by mouth 3 (three) times daily as needed. What changed:  how much to take  reasons to take this   levothyroxine 50 MCG tablet Commonly known as:  SYNTHROID, LEVOTHROID Take 50 mcg by mouth daily.   methylcellulose 1 % ophthalmic solution Commonly known as:  ARTIFICIAL TEARS Place 1 drop into both eyes daily as needed (allergies/dry eyes).   multivitamin with minerals Tabs tablet Take 1 tablet by mouth daily. Start taking on:  04/07/2016   OXYGEN 24/7- 2lpm with sleep and 2-4 with exertion   PROAIR HFA 108 (90 Base) MCG/ACT inhaler Generic drug:  albuterol INHALE UP TO 2 PUFFS EVERY 4 HOURS AS NEEDED What changed:  See the new instructions.   SPIRIVA HANDIHALER 18 MCG inhalation capsule Generic drug:  tiotropium USE ONE (1) CAPSULE INHALATION ONCE DAILY What changed:  See the new instructions.      Follow-up Information    THOMPSON, DAVID A., MD. Schedule an appointment as soon as possible for a visit.   Specialty:  Orthopedic Surgery Why:  for first week in January Contact information: [address] [place] [address] [phone]        Leslye Peer., MD. Schedule an appointment as soon as possible for a visit in 2 week(s).   Specialty:  Pulmonary Disease Contact information: [address] [place] [address] [phone]        [name], MD. Schedule an appointment as soon as possible for a visit in 1 week(s).   Specialty:  Family Medicine Why:  Hospital Follow uP  Contact information: [address] [place] [address] [phone]          Allergies  Allergen Reactions  . Penicillins Anaphylaxis    Has patient had a PCN reaction causing immediate rash, facial/tongue/throat swelling, SOB or  lightheadedness with hypotension: yes Has patient had a PCN reaction causing severe rash involving mucus membranes or skin necrosis: unknown Has patient had a PCN reaction that required hospitalization: unknown Has patient had a PCN reaction occurring within the last 10 years: no If all of the above answers are "NO", then may proceed with Cephalosporin use. HAS TOLERATED AMPICILLIN SINCE RX AS CHILD   . Promethazine Hcl Diarrhea and Nausea And Vomiting   Procedures/Studies: Dg Chest 2 View  Result Date: 03/29/2016 CLINICAL DATA:  Fall. Recent humerus fracture. Cough. Congestion. COPD/asthma. EXAM: CHEST  2 VIEW COMPARISON:  02/10/2016 FINDINGS: Moderately degraded lateral view, secondary to patient positioning, including arms not raised above the head. Mid thoracic compression deformity is moderate and similar. Midline trachea. Normal heart size. Atherosclerosis in the transverse aorta. No pleural fluid. Moderate left-sided pneumothorax, primarily identified inferiorly and laterally. On the order of 40%. Underlying COPD/chronic bronchitis, as evidenced by interstitial thickening and hyperinflation. IMPRESSION: Moderate  size left-sided pneumothorax. Aortic atherosclerosis. Critical test results telephoned to dr. Adela Lank. at the time of interpretation at 11:56 a.m.on 03/29/2016. Electronically Signed   By: Jeronimo Greaves M.D.   On: 03/29/2016 11:56   Ct Head Wo Contrast  Result Date: 03/29/2016 CLINICAL DATA:  Recent falls.  Right arm fracture. EXAM: CT HEAD WITHOUT CONTRAST CT CERVICAL SPINE WITHOUT CONTRAST TECHNIQUE: Multidetector CT imaging of the head and cervical spine was performed following the standard protocol without intravenous contrast. Multiplanar CT image reconstructions of the cervical spine were also generated. COMPARISON:  Cervical spine myelogram 05/07/2010 FINDINGS: CT HEAD FINDINGS Brain: Mild generalized atrophy and white matter disease is somewhat advanced for age no acute infarct,  hemorrhage, or mass lesion is present. The ventricles are proportionate to the degree of atrophy. No significant extra-axial fluid collection is present. Vascular: Atherosclerotic calcifications are present within the cavernous internal carotid arteries bilaterally. There is no hyperdense vessel. Skull: The calvarium is intact. No acute fractures are present. No focal lytic or blastic lesions are present. No significant extracranial soft tissue injury is present. Sinuses/Orbits: The paranasal sinuses and mastoid air cells are clear. Bilateral lens replacements are present. The globes and orbits are otherwise intact. CT CERVICAL SPINE FINDINGS Alignment: Slight retrolisthesis at C3-4 at C4-5 is stable. AP alignment is otherwise anatomic. Skull base and vertebrae: The craniocervical junction is within normal limits. Soft tissues and spinal canal: Dense atherosclerotic calcifications are present at the carotid bifurcations bilaterally with possible stenosis on the left. The soft tissues the neck are otherwise unremarkable. The spinal canal is grossly patent. Disc levels: Multilevel stenosis of the cervical spine is again noted. The most severe level is at C4-5 with moderate central and bilateral foraminal stenosis. Upper chest: A prominent left pneumothorax is noted. This was called earlier today. Severe centrilobular emphysema is noted. IMPRESSION: 1. Persistent left-sided pneumothorax. 2. Emphysema. 3. Multilevel spondylosis of the cervical spine without acute fracture or traumatic subluxation. 4. Moderate atrophy and white matter disease likely reflects the sequela of chronic microvascular ischemia. 5. No acute intracranial abnormality. These results will be called to the ordering clinician or representative by the Radiologist Assistant, and communication documented in the PACS or zVision Dashboard. Electronically Signed   By: Marin Roberts M.D.   On: 03/29/2016 14:23   Ct Chest Wo Contrast  Result Date:  03/29/2016 CLINICAL DATA:  Chest tightness and worsening cough in patient with history of COPD. Pneumothorax by plain films earlier today. EXAM: CT CHEST WITHOUT CONTRAST TECHNIQUE: Multidetector CT imaging of the chest was performed following the standard protocol without IV contrast. COMPARISON:  PA and lateral chest this same day and 07/31/2015. CT chest 11/20/2012. FINDINGS: Cardiovascular: Calcific aortic and coronary atherosclerosis is identified. No pericardial effusion. Heart size is normal. Mediastinum/Nodes: No enlarged mediastinal or axillary lymph nodes. Thyroid gland, trachea and esophagus demonstrate no significant findings. Lungs/Pleura: The patient has a known left hydropneumothorax with only a small amount of pleural fluid. Pneumothorax estimated at 70%. The lungs demonstrate extensive emphysematous disease. There is some atelectasis bilaterally, worse on the left. Upper Abdomen: The patient is status post cholecystectomy. No acute abnormality is identified. Musculoskeletal: T7 and T8 compression fracture appears are identified as seen 07/31/2015 exam. No worrisome lesion is identified. IMPRESSION: Left hydropneumothorax. There is only a small amount of pleural fluid. Pneumothorax is estimated at 70%. Severe emphysema. Atherosclerosis. Critical Value/emergent results were called by telephone at the time of interpretation on 03/29/2016 at 5:25 pm to [facility] who  verbally acknowledged these results. Electronically Signed   By: Drusilla Kanner M.D.   On: 03/29/2016 17:27   Ct Cervical Spine Wo Contrast  Result Date: 03/29/2016 CLINICAL DATA:  Recent falls.  Right arm fracture. EXAM: CT HEAD WITHOUT CONTRAST CT CERVICAL SPINE WITHOUT CONTRAST TECHNIQUE: Multidetector CT imaging of the head and cervical spine was performed following the standard protocol without intravenous contrast. Multiplanar CT image reconstructions of the cervical spine were also generated. COMPARISON:  Cervical spine  myelogram 05/07/2010 FINDINGS: CT HEAD FINDINGS Brain: Mild generalized atrophy and white matter disease is somewhat advanced for age no acute infarct, hemorrhage, or mass lesion is present. The ventricles are proportionate to the degree of atrophy. No significant extra-axial fluid collection is present. Vascular: Atherosclerotic calcifications are present within the cavernous internal carotid arteries bilaterally. There is no hyperdense vessel. Skull: The calvarium is intact. No acute fractures are present. No focal lytic or blastic lesions are present. No significant extracranial soft tissue injury is present. Sinuses/Orbits: The paranasal sinuses and mastoid air cells are clear. Bilateral lens replacements are present. The globes and orbits are otherwise intact. CT CERVICAL SPINE FINDINGS Alignment: Slight retrolisthesis at C3-4 at C4-5 is stable. AP alignment is otherwise anatomic. Skull base and vertebrae: The craniocervical junction is within normal limits. Soft tissues and spinal canal: Dense atherosclerotic calcifications are present at the carotid bifurcations bilaterally with possible stenosis on the left. The soft tissues the neck are otherwise unremarkable. The spinal canal is grossly patent. Disc levels: Multilevel stenosis of the cervical spine is again noted. The most severe level is at C4-5 with moderate central and bilateral foraminal stenosis. Upper chest: A prominent left pneumothorax is noted. This was called earlier today. Severe centrilobular emphysema is noted. IMPRESSION: 1. Persistent left-sided pneumothorax. 2. Emphysema. 3. Multilevel spondylosis of the cervical spine without acute fracture or traumatic subluxation. 4. Moderate atrophy and white matter disease likely reflects the sequela of chronic microvascular ischemia. 5. No acute intracranial abnormality. These results will be called to the ordering clinician or representative by the Radiologist Assistant, and communication documented  in the PACS or zVision Dashboard. Electronically Signed   By: Marin Roberts M.D.   On: 03/29/2016 14:23   Dg Chest Port 1 View  Result Date: 04/06/2016 CLINICAL DATA:  Follow-up pneumothorax EXAM: PORTABLE CHEST 1 VIEW COMPARISON:  Portable chest x-ray of April 05, 2016 and July 31, 2015. FINDINGS: The lungs are mildly hyperinflated. The pleural line demonstrated on yesterday's study on the left in the upper hemithorax is not evident today. There remains hyperlucency in the mid and lower left hemithorax which may reflect a pneumothorax lying anterior or posterior to re- inflated lung parenchyma or may be normal for the patient. The right lung is clear. The heart and pulmonary vascularity are normal. There is calcification in the wall of the aortic arch. A small amount of subcutaneous emphysema is noted on the left. IMPRESSION: Interval clearing of the small apical pneumothorax. No definite residual pneumothorax though there is hyperlucency in the inferior aspect of the left hemithorax which is similar to that seen on the July 31, 2015 study. Thoracic aortic atherosclerosis. Electronically Signed   By: David  [place]az[place]and M.D.   On: 04/06/2016 07:55   Dg Chest Port 1 View  Result Date: 04/05/2016 CLINICAL DATA:  Follow-up for pneumothorax. Status post left chest tube removal. EXAM: PORTABLE CHEST 1 VIEW COMPARISON:  04/04/2016 FINDINGS: Since prior exam, the left chest tube has been removed. There is a minimal left  apical pneumothorax. Lungs remain hyperexpanded. There is stable scarring. No acute abnormalities in the lungs. No pleural effusion. Cardiac silhouette is normal in size. No mediastinal or hilar masses. Left lateral chest wall subcutaneous emphysema is stable from the prior study. IMPRESSION: 1. Minimal left apical pneumothorax is seen following left chest tube removal. 2. No other change. Electronically Signed   By: Amie Portland M.D.   On: 04/05/2016 15:30   Dg Chest Port 1 View  Result  Date: 04/04/2016 CLINICAL DATA:  COPD.  Pneumothorax followup EXAM: PORTABLE CHEST 1 VIEW COMPARISON:  04/03/2016 FINDINGS: Left chest tube remains in place.  No pneumothorax. COPD with hyperinflation of the lungs. Right lower lobe mild airspace disease unchanged. Negative for heart failure or effusion. IMPRESSION: Left chest tube in place.  No pneumothorax No change right lower lobe airspace disease. Electronically Signed   By: Marlan Palau M.D.   On: 04/04/2016 06:54   Dg Chest Port 1 View  Result Date: 04/03/2016 CLINICAL DATA:  Followup LEFT pneumothorax EXAM: PORTABLE CHEST 1 VIEW COMPARISON:  Portable exam 1448 hours compared to 0455 hours FINDINGS: LEFT thoracostomy tube stable. Normal heart size, mediastinal contours, and pulmonary vascularity. Lungs hyperinflated/emphysematous but clear. No acute infiltrate or pleural effusion. No pneumothorax. LEFT lateral chest wall emphysema again noted. Bones demineralized. IMPRESSION: LEFT chest tube without pneumothorax. Underlying emphysematous changes. Electronically Signed   By: Ulyses Southward M.D.   On: 04/03/2016 15:10   Dg Chest Port 1 View  Result Date: 04/03/2016 CLINICAL DATA:  Patient with pneumothorax.  Re-evaluation. EXAM: PORTABLE CHEST 1 VIEW COMPARISON:  Chest radiograph 04/02/2016. FINDINGS: Monitoring leads overlie the patient. Normal cardiac and mediastinal contours. Emphysematous change. Left chest tube remains in place. No definite left-sided pneumothorax. Subcutaneous emphysema left chest wall. IMPRESSION: No definite pneumothorax.  Left chest tube in place. Electronically Signed   By: Annia Belt M.D.   On: 04/03/2016 07:01   Dg Chest Port 1 View  Result Date: 04/02/2016 CLINICAL DATA:  Pneumothorax EXAM: PORTABLE CHEST 1 VIEW COMPARISON:  04/01/2016 FINDINGS: Cardiomediastinal silhouette is stable. Left chest tube is unchanged in position. Subcutaneous emphysema left chest wall. No infiltrate or pulmonary edema. No pneumothorax.  IMPRESSION: Stable left chest tube position. No pneumothorax. Subcutaneous emphysema left chest wall again noted. Electronically Signed   By: Natasha Mead M.D.   On: 04/02/2016 09:05   Dg Chest Port 1 View  Result Date: 04/01/2016 CLINICAL DATA:  COPD. Status post fall 03/27/2016. Pneumothorax diagnosed 03/29/2016. Chest tube in place. EXAM: PORTABLE CHEST 1 VIEW COMPARISON:  Single-view of the chest 03/31/2016 and 03/30/2016. FINDINGS: Left chest tube remains in place. Subcutaneous emphysema is seen along the left chest wall. No pneumothorax is identified. The lungs are emphysematous. No focal airspace disease. Heart size normal. Atherosclerosis noted. IMPRESSION: Left chest tube in place.  Negative for pneumothorax. Emphysema. Atherosclerosis. Electronically Signed   By: Drusilla Kanner M.D.   On: 04/01/2016 07:51   Dg Chest Port 1 View  Result Date: 03/31/2016 CLINICAL DATA:  Pneumothorax. EXAM: PORTABLE CHEST 1 VIEW COMPARISON:  Portable film earlier today. FINDINGS: LEFT chest tube is again noted. The LEFT lung is now re-expanded. No significant LEFT pneumothorax is observed. Mild subsegmental atelectasis at both bases without consolidation or effusion. Normal cardiomediastinal silhouette. Bones unremarkable. IMPRESSION: No significant pneumothorax is now observed. Re-expansion of the LEFT lung. Electronically Signed   By: Elsie Stain M.D.   On: 03/31/2016 10:37   Dg Chest Port 1 View  Result Date:  03/31/2016 CLINICAL DATA:  Follow-up pneumothorax EXAM: PORTABLE CHEST 1 VIEW COMPARISON:  03/30/2016 FINDINGS: Left-sided thoracostomy catheter is again noted in stable position. There has been recurrence of the patient's left-sided pneumothorax however predominately involving the upper lobe. Mild bibasilar atelectatic changes are noted particularly on the left. No other focal abnormality is seen. IMPRESSION: Recurrent left pneumothorax involving predominately the upper lobe. Critical Value/emergent  results were called by telephone at the time of interpretation on 03/31/2016 at 7:25 am to [facility] nurse, who verbally acknowledged these results. Electronically Signed   By: Alcide CleverMark  Lukens M.D.   On: 03/31/2016 07:27   Dg Chest Port 1 View  Result Date: 03/30/2016 CLINICAL DATA:  Left pneumothorax. COPD. Respiratory failure with hypercapnia. EXAM: PORTABLE CHEST 1 VIEW COMPARISON:  03/29/2016 FINDINGS: Left chest tube remains in place. No residual pneumothorax visualized. Decreased atelectasis or infiltrate is seen in the left retrocardiac lung base. Right lung is clear. Heart size is normal. IMPRESSION: Decreased atelectasis or infiltrate in the left track lung base. No residual pneumothorax visualized. Electronically Signed   By: Myles RosenthalJohn  Stahl M.D.   On: 03/30/2016 07:18   Dg Chest Portable 1 View  Result Date: 03/29/2016 CLINICAL DATA:  Status post left chest tube placement today for pneumothorax. EXAM: PORTABLE CHEST 1 VIEW COMPARISON:  CT chest and PA and lateral chest earlier today. FINDINGS: New left chest tube is in place. Left pneumothorax is nearly completely resolved with only a very small apical component remaining. Lungs are emphysematous. IMPRESSION: Near complete resolution of left pneumothorax after chest tube placement. Emphysema. Electronically Signed   By: Drusilla Kannerhomas  Dalessio M.D.   On: 03/29/2016 20:07   Dg Knee Complete 4 Views Right  Result Date: 03/29/2016 CLINICAL DATA:  Fall EXAM: RIGHT KNEE - COMPLETE 4+ VIEW COMPARISON:  None. FINDINGS: Four views of the right knee submitted. No acute fracture or subluxation. There is mild narrowing of medial joint compartment. Chondrocalcinosis is noted. Mild narrowing of patellofemoral joint space. IMPRESSION: No acute fracture or subluxation. Chondrocalcinosis. Osteoarthritic changes. Electronically Signed   By: Natasha MeadLiviu  Pop M.D.   On: 03/29/2016 17:26   Dg Humerus Right  Result Date: 04/05/2016 CLINICAL DATA:  Follow-up of right humeral  fracture from 3 weeks ago. History of osteopenia. EXAM: RIGHT HUMERUS - 2+ VIEW COMPARISON:  Right humerus series of March 03, 2016. FINDINGS: The images are obtained in a splint. The midshaft spiral fracture is again demonstrated. The angulation has been reduced. The fracture fragments remain displaced by 1/2 shaft width. There is periosteal reaction noted about the fracture site. IMPRESSION: Ongoing healing of the mildly displaced midshaft right humeral fracture. Periosteal reaction is present but no bony bridging is observed. Electronically Signed   By: David  SwazilandJordan M.D.   On: 04/05/2016 15:31    Subjective: Pt says she is feeling much better.    Discharge Exam: Vitals:   04/05/16 1959 04/06/16 0540  BP: (!) 137/54 (!) 122/55  Pulse: 93 100  Resp: 18 18  Temp: 98 F (36.7 C) 98 F (36.7 C)   Vitals:   04/05/16 1959 04/05/16 2028 04/06/16 0540 04/06/16 0801  BP: (!) 137/54  (!) 122/55   Pulse: 93  100   Resp: 18  18   Temp: 98 F (36.7 C)  98 F (36.7 C)   TempSrc: Oral  Oral   SpO2: 95% 95% 100% 97%  Weight:      Height:       General exam: awake, alert, no  distress, cooperative. Respiratory system: shallow but clear. No increased work of breathing. barrell chest.  Cardiovascular system: S1 & S2 heard, RRR. No JVD, murmurs, gallops, clicks or pedal edema. Gastrointestinal system: Abdomen is nondistended, soft and nontender. Normal bowel sounds heard. Central nervous system: Alert and oriented. No focal neurological deficits. Extremities: right arm sling, no CCE.  The results of significant diagnostics from this hospitalization (including imaging, microbiology, ancillary and laboratory) are listed below for reference.     Microbiology: Recent Results (from the past 240 hour(s))  Culture, blood (routine x 2)     Status: None   Collection Time: 03/29/16  4:51 PM  Result Value Ref Range Status   Specimen Description BLOOD LEFT ANTECUBITAL  Final   Special Requests  BOTTLES DRAWN AEROBIC ONLY 5CC  Final   Culture   Final    NO GROWTH 5 DAYS Performed at [facility]    Report Status 04/03/2016 FINAL  Final  Culture, blood (routine x 2)     Status: None   Collection Time: 03/29/16  4:53 PM  Result Value Ref Range Status   Specimen Description BLOOD LEFT HAND  Final   Special Requests BOTTLES DRAWN AEROBIC ONLY 5CC  Final   Culture   Final    NO GROWTH 5 DAYS Performed at [facility]    Report Status 04/03/2016 FINAL  Final  MRSA PCR Screening     Status: None   Collection Time: 03/30/16 12:22 AM  Result Value Ref Range Status   MRSA by PCR NEGATIVE NEGATIVE Final    Comment:        The GeneXpert MRSA Assay (FDA approved for NASAL specimens only), is one component of a comprehensive MRSA colonization surveillance program. It is not intended to diagnose MRSA infection nor to guide or monitor treatment for MRSA infections.      Labs: BNP (last 3 results) No results for input(s): BNP in the last 8760 hours. Basic Metabolic Panel:  Recent Labs Lab 04/02/16 0337 04/03/16 0343 04/04/16 0541 04/05/16 0656 04/06/16 0526  NA 140 135  --  135 136  K 2.8* 3.2*  --  2.7* 4.8  CL 92* 92*  --  89* 95*  CO2 39* 34*  --  37* 33*  GLUCOSE 109* 105*  --  104* 101*  BUN 8 9  --  6 8  CREATININE 0.36* 0.39*  --  0.50 0.41*  CALCIUM 8.9 8.8*  --  9.0 9.3  MG 1.8  --  1.8 1.8 2.2  PHOS 2.7  --  3.6 3.5 3.5   Liver Function Tests: No results for input(s): AST, ALT, ALKPHOS, BILITOT, PROT, ALBUMIN in the last 168 hours. No results for input(s): LIPASE, AMYLASE in the last 168 hours. No results for input(s): AMMONIA in the last 168 hours. CBC:  Recent Labs Lab 04/02/16 0337 04/03/16 0343 04/05/16 0536 04/06/16 0526  WBC 13.2* 13.7* 14.9* 14.5*  HGB 9.6* 10.1* 10.3* 11.2*  HCT 29.6* 31.7* 31.3* 34.0*  MCV 93.4 94.1 92.3 91.2  PLT 263 320 380 447*   Cardiac Enzymes: No results for input(s): CKTOTAL, CKMB, CKMBINDEX,  TROPONINI in the last 168 hours. BNP: Invalid input(s): POCBNP CBG: No results for input(s): GLUCAP in the last 168 hours. D-Dimer No results for input(s): DDIMER in the last 72 hours. Hgb A1c No results for input(s): HGBA1C in the last 72 hours. Lipid Profile No results for input(s): CHOL, HDL, LDLCALC, TRIG, CHOLHDL, LDLDIRECT in the last 72 hours.  Thyroid function studies No results for input(s): TSH, T4TOTAL, T3FREE, THYROIDAB in the last 72 hours.  Invalid input(s): FREET3 Anemia work up No results for input(s): VITAMINB12, FOLATE, FERRITIN, TIBC, IRON, RETICCTPCT in the last 72 hours. Urinalysis    Component Value Date/Time   COLORURINE YELLOW 03/29/2016 1211   APPEARANCEUR CLEAR 03/29/2016 1211   LABSPEC 1.008 03/29/2016 1211   PHURINE 6.5 03/29/2016 1211   GLUCOSEU NEGATIVE 03/29/2016 1211   HGBUR SMALL (A) 03/29/2016 1211   BILIRUBINUR NEGATIVE 03/29/2016 1211   KETONESUR 40 (A) 03/29/2016 1211   PROTEINUR NEGATIVE 03/29/2016 1211   UROBILINOGEN 0.2 12/12/2006 1153   NITRITE NEGATIVE 03/29/2016 1211   LEUKOCYTESUR NEGATIVE 03/29/2016 1211   Sepsis Labs Invalid input(s): PROCALCITONIN,  WBC,  LACTICIDVEN Microbiology Recent Results (from the past 240 hour(s))  Culture, blood (routine x 2)     Status: None   Collection Time: 03/29/16  4:51 PM  Result Value Ref Range Status   Specimen Description BLOOD LEFT ANTECUBITAL  Final   Special Requests BOTTLES DRAWN AEROBIC ONLY 5CC  Final   Culture   Final    NO GROWTH 5 DAYS Performed at [facility]    Report Status 04/03/2016 FINAL  Final  Culture, blood (routine x 2)     Status: None   Collection Time: 03/29/16  4:53 PM  Result Value Ref Range Status   Specimen Description BLOOD LEFT HAND  Final   Special Requests BOTTLES DRAWN AEROBIC ONLY 5CC  Final   Culture   Final    NO GROWTH 5 DAYS Performed at [facility]    Report Status 04/03/2016 FINAL  Final  MRSA PCR Screening     Status: None    Collection Time: 03/30/16 12:22 AM  Result Value Ref Range Status   MRSA by PCR NEGATIVE NEGATIVE Final    Comment:        The GeneXpert MRSA Assay (FDA approved for NASAL specimens only), is one component of a comprehensive MRSA colonization surveillance program. It is not intended to diagnose MRSA infection nor to guide or monitor treatment for MRSA infections.    Time coordinating discharge: 31 minutes  SIGNED:  Standley Dakins, MD  [facility] 04/06/2016, 2:07 PM Pager   If 7PM-7AM, please contact night-coverage [email or web address] Password [email or web address]

## 2016-04-06 NOTE — Progress Notes (Signed)
[facility]  CXR this morning reviewed, no pneumothorax OK for d/c from pulmonary standpoint F/u with Dr. [name] 2-4 weeks  Heber CarolinaBrent Aolani Piggott, MD [place] [facility] Pager: [phone] Cell: [phone] After 3pm or if no response, call [phone]

## 2016-04-06 NOTE — Progress Notes (Signed)
Nutrition Follow-up  DOCUMENTATION CODES:   Non-severe (moderate) malnutrition in context of chronic illness  INTERVENTION:  - Continue Boost Breeze TID. - Continue to encourage PO intakes of meals and supplements. - RD will continue to monitor for additional nutrition-related needs.  NUTRITION DIAGNOSIS:   Increased nutrient needs related to chronic illness (COPD) as evidenced by estimated needs (for protein). -ongoing  GOAL:   Patient will meet greater than or equal to 90% of their needs -minimally met on average.   MONITOR:   PO intake, Supplement acceptance, Weight trends, Labs, I & O's  ASSESSMENT:    70 y.o. female with medical history significant of COPD  chronic hypoxemic respiratory failure on home oxygen 3 L, former smoker, former EtOH abuse, hx hypothroidism, DJD, anxiety, recurrent falls. Pt admitted after fall at home.  12/12 Per review, pt consumed 50% of breakfast and 25% of lunch and dinner 12/10; 75% of breakfast and dinner 12/11; 100% of breakfast this AM. Pt has been drinking Boost Breeze when provided and tolerates this supplement with out issue; she likes the [employer] and it has been working well for her unlike Ensure or regular Boost. New weight on 12/9 shows +3 lb since admission; will continue to monitor weight trends closely.  Physical assessment shows mild to moderate muscle and mild to moderate fat wasting to upper body; lower body not assessed at this time. Malnutrition dx updated based on findings.   Medications reviewed; 50 mcg oral Synthroid/day, 1 g IV Mg sulfate x1 dose yesterday, daily multivitamin with minerals, PRN Zofran, 60 mEq oral KCl BID.  Labs reviewed; Cl: 95 mmol/L, creatinine: 0.41 mg/dL.   12/5 - Pt was NPO until around noon when diet was advanced and she is now on Regular diet.  - Pt reports she ate a small amount of each item on tay for lunch.  - PT and OT had worked with pt right before RD visit. - She was dx with COPD and  placed on O2 in 2011.  - In 2014 she was working and taking care of her mother who was ill and states that she began to quickly decline around that time.  - She stopped working in the spring of 2016 and weight has been stable since that time (confirmed by weight hx available in pt's chart showing stable weight since 08/22/14).  - She has had a poor appetite for a long time but is unsure when this began.  - COPD causes severe SOB with eating at times and also decreases appetite because of difficulty with breathing that often occurs during meal times.  - She confirms that liquids are easier than solid foods most of the time.  - She has tried oral nutrition supplements such as Ensure and Boost in the past but states that they have caused diarrhea for her.  - Talked with her about Boost Breeze as she has not tried a clear liquid supplement; pt interested in trying this during hospitalization. - Unable to perform physical assessment during this visit and pt desired to rest after oral nutrition supplements had been discussed.  - Will complete physical assessment at follow-up and document findings at that time.  - Unable to state malnutrition without performing this.    Diet Order:  Diet regular Room service appropriate? Yes; Fluid consistency: Thin  Skin:  Reviewed, no issues  Last BM:  12/9  Height:   Ht Readings from Last 1 Encounters:  04/03/16 5' 1"  (1.549 m)    Weight:  Wt Readings from Last 1 Encounters:  04/03/16 103 lb 13.4 oz (47.1 kg)    Ideal Body Weight:  45.45 kg  BMI:  Body mass index is 19.62 kg/m.  Estimated Nutritional Needs:   Kcal:  1135-1360 (25-30 kcal/kg)  Protein:  60-70 grams  Fluid:  >/= 1.3 L/day  EDUCATION NEEDS:   No education needs identified at this time    Jarome Matin, MS, RD, LDN, CNSC Inpatient Clinical Dietitian Pager # [phone] After hours/weekend pager # [phone]

## 2016-04-06 NOTE — Progress Notes (Signed)
Pt says she can't go home.  Feels anxious and son already left and ask CM to reassess.  Will hold dc. Reassess 12/13.

## 2016-04-06 NOTE — Care Management Important Message (Deleted)
Important Message  Patient Details  Name: Cheyenne [name] MRN: [id number] Date of Birth: [birth year]   Medicare Important Message Given:  Yes    Haskell FlirtJamison, Emeterio Balke 04/06/2016, 11:33 AMImportant Message  Patient Details  Name: Cheyenne [name] MRN: [id number] Date of Birth: [birth year]   Medicare Important Message Given:  Yes    Haskell FlirtJamison, Alexandr Yaworski 04/06/2016, 11:33 AM

## 2016-04-06 NOTE — Progress Notes (Signed)
Patient missed f/u appt for humerus fx in my office due to present hospitalization.   Xrays done yesterday, revealing satisfactory interval progress.  RECS: Continue Sarmiento brace Continue ROM ex shoulder, elbow, wrist, hand, digits F/u with me in approx a month I've already conversed with her husband regarding these results and this plan so he might make the appt with my office.  Neil Crouchave Cheyenne Bayle, MD Hand Surgery Office [phone]

## 2016-04-07 ENCOUNTER — Inpatient Hospital Stay (HOSPITAL_COMMUNITY): Payer: Medicare Other

## 2016-04-07 DIAGNOSIS — S42301A Unspecified fracture of shaft of humerus, right arm, initial encounter for closed fracture: Secondary | ICD-10-CM

## 2016-04-07 DIAGNOSIS — R000 Tachycardia, unspecified: Secondary | ICD-10-CM

## 2016-04-07 DIAGNOSIS — R0602 Shortness of breath: Secondary | ICD-10-CM

## 2016-04-07 DIAGNOSIS — S42301D Unspecified fracture of shaft of humerus, right arm, subsequent encounter for fracture with routine healing: Secondary | ICD-10-CM

## 2016-04-07 MED ORDER — IOPAMIDOL (ISOVUE-370) INJECTION 76%
INTRAVENOUS | Status: AC
  Filled 2016-04-07: qty 100

## 2016-04-07 MED ORDER — IPRATROPIUM BROMIDE 0.02 % IN SOLN
0.5000 mg | Freq: Four times a day (QID) | RESPIRATORY_TRACT | Status: DC
  Administered 2016-04-07 – 2016-04-11 (×18): 0.5 mg via RESPIRATORY_TRACT
  Filled 2016-04-07 (×17): qty 2.5

## 2016-04-07 MED ORDER — LEVALBUTEROL HCL 0.63 MG/3ML IN NEBU
0.6300 mg | INHALATION_SOLUTION | Freq: Four times a day (QID) | RESPIRATORY_TRACT | Status: DC
  Administered 2016-04-07 – 2016-04-11 (×17): 0.63 mg via RESPIRATORY_TRACT
  Filled 2016-04-07 (×16): qty 3

## 2016-04-07 MED ORDER — METOPROLOL TARTRATE 5 MG/5ML IV SOLN
5.0000 mg | Freq: Once | INTRAVENOUS | Status: AC
Start: 2016-04-07 — End: 2016-04-07
  Administered 2016-04-07: 5 mg via INTRAVENOUS
  Filled 2016-04-07: qty 5

## 2016-04-07 MED ORDER — SODIUM CHLORIDE 0.9 % IV BOLUS (SEPSIS)
500.0000 mL | Freq: Once | INTRAVENOUS | Status: AC
  Administered 2016-04-07: 500 mL via INTRAVENOUS

## 2016-04-07 MED ORDER — SODIUM CHLORIDE 0.9 % IJ SOLN
INTRAMUSCULAR | Status: AC
  Filled 2016-04-07: qty 50

## 2016-04-07 MED ORDER — LORAZEPAM 0.5 MG PO TABS
0.5000 mg | ORAL_TABLET | Freq: Three times a day (TID) | ORAL | Status: DC | PRN
  Administered 2016-04-07 – 2016-04-12 (×7): 0.5 mg via ORAL
  Filled 2016-04-07 (×7): qty 1

## 2016-04-07 MED ORDER — SODIUM CHLORIDE 0.9 % IV SOLN
INTRAVENOUS | Status: AC
  Administered 2016-04-07 – 2016-04-08 (×2): via INTRAVENOUS

## 2016-04-07 MED ORDER — IOPAMIDOL (ISOVUE-370) INJECTION 76%
75.0000 mL | Freq: Once | INTRAVENOUS | Status: AC | PRN
  Administered 2016-04-07: 56.6 mL via INTRAVENOUS

## 2016-04-07 NOTE — Consult Note (Signed)
Consultation Note Date: 04/07/2016   Patient Name: Cheyenne [name]  DOB: 13-Mar-1946  MRN: [id number]  Age / Sex: 70 y.o., female  PCP: Juluis RainierElizabeth Barnes, MD Referring Physician: Rodolph Bonganiel V Thompson, MD  Reason for Consultation: Establishing goals of care  HPI/Patient Profile: 70 y.o. female    admitted on 03/29/2016     Clinical Assessment and Goals of Care:  Patient is a 70 year old female, being seen by Dr, [name] for severe COPD, on 3L O2 24/7, former smoker, history of ETOH abuse, chronic pain, Acute on chronic hypoxemic respiratory failure secondary to acute exacerbation of COPD, significant pneumothorax in the left 2/2 fall,s/p chest tube left 12/4 by [facility] (Dr. Tyrone SageGerhardt), chest tube removed 12/11, patient was seen and followed by pulmonary service throughout the course of this hospitalization. She is due to folow up with Dr Janee Mornhompson in 1 month for her R humerus fracture. It is noted that the patient was set up to go home with home health, how ever, patient continued to complain of shortness of breath, ongoing weakness, hence PT consult was obtained and at present, search is on for a skilled nursing facility evaluation for rehabilitation attempt. A palliative consult has also been requested for further goals of care.   The patient is resting in bed, she appears mildly dyspneic even at baseline, her husband, friend and daughter are all at the bedside. I introduced myself and palliative care as follows: Palliative medicine is specialized medical care for people living with serious illness. It focuses on providing relief from the symptoms and stress of a serious illness. The goal is to improve quality of life for both the patient and the family.  Patient states she feels weak, she is willing to pursue rehab, wishes to get stronger. She becomes tearful and states, "I'm sorry, but I don't want to end up in palliative  care." offered active listening and supportive care, all questions answered to the best of my ability, endorsed her hopes of getting better. Some gentle discussions regarding COPD trajectory being one of chronic irreversible illness marred by exacerbations and gradual progressive decline undertaken, which were well received by both the patient and her family, see recommendations below, thank you for the consult.   HCPOA  husband, daughter.   SUMMARY OF RECOMMENDATIONS    Agree with DNR Recommend SNF rehab with palliative on discharge, to monitor patient's disease trajectory and to continue goals of care discussions.  Continue current pain management options, has Oxy IR.  Agree with PT evaluation  Code Status/Advance Care Planning:  DNR    Symptom Management:    continue current treatment.   Palliative Prophylaxis:   Bowel Regimen  Additional Recommendations (Limitations, Scope, Preferences):  Full Scope Treatment  Psycho-social/Spiritual:   Desire for further Chaplaincy support:no  Additional Recommendations: Caregiving  Support/Resources  Prognosis:   Unable to determine  Discharge Planning: Skilled Nursing Facility for rehab with Palliative care service follow-up      Primary Diagnoses: Present on Admission: . Alcohol abuse . Respiratory failure with hypercapnia (  HCC) . Hypokalemia . COPD exacerbation (HCC) . Pneumothorax   I have reviewed the medical record, interviewed the patient and family, and examined the patient. The following aspects are pertinent.  Past Medical History:  Diagnosis Date  . Alcohol abuse   . Anxiety   . Asthma   . COPD (chronic obstructive pulmonary disease) (HCC)   . Depression   . DJD (degenerative joint disease)   . Hypothyroidism   . Osteopenia   . Psoriatic arthritis (HCC) 06/02/2009  . Vitamin B12 deficiency    Social History   Social History  . Marital status: Married    Spouse name: N/A  . Number of children: N/A    . Years of education: N/A   Occupational History  . Office assistant    Social History Main Topics  . Smoking status: Former Smoker    Packs/day: 0.50    Years: 40.00    Types: Cigarettes    Quit date: 04/26/2009  . Smokeless tobacco: Never Used  . Alcohol use No  . Drug use: No  . Sexual activity: Not Asked   Other Topics Concern  . None   Social History Narrative   Married, but husband is ill   She is primary caregiver for her mother   Government social research officer, Former Engineer, civil (consulting)   Family History  Problem Relation Age of Onset  . Other Father   . CAD Father   . Diabetes Other   . Stroke Other    Scheduled Meds: . budesonide (PULMICORT) nebulizer solution  0.5 mg Nebulization BID  . buPROPion  150 mg Oral Daily  . escitalopram  10 mg Oral Daily  . feeding supplement  1 Container Oral TID BM  . guaiFENesin  600 mg Oral BID  . heparin subcutaneous  5,000 Units Subcutaneous Q8H  . ipratropium-albuterol  3 mL Nebulization QID  . levothyroxine  50 mcg Oral Daily  . mouth rinse  15 mL Mouth Rinse BID  . multivitamin with minerals  1 tablet Oral Daily  . sodium chloride flush  3 mL Intravenous Q12H   Continuous Infusions: PRN Meds:.acetaminophen **OR** acetaminophen, diazepam, ipratropium-albuterol, LORazepam, ondansetron **OR** ondansetron (ZOFRAN) IV, oxyCODONE, oxyCODONE-acetaminophen Medications Prior to Admission:  Prior to Admission medications   Medication Sig Start Date End Date Taking? Authorizing Provider  BREO ELLIPTA 100-25 MCG/INH AEPB INHALE ONE PUFF INTO THE LUNGS ONCE DAILY 12/10/15  Yes Leslye Peer, MD  buPROPion (WELLBUTRIN XL) 150 MG 24 hr tablet Take 150 mg by mouth daily.   Yes Historical Provider, MD  calcium carbonate (OS-CAL) 600 MG TABS Take 600 mg by mouth daily.     Yes Historical Provider, MD  escitalopram (LEXAPRO) 10 MG tablet Take 10 mg by mouth daily. 02/23/16  Yes Historical Provider, MD  fluticasone (FLONASE) 50 MCG/ACT nasal spray PLACE 2 SPRAYS  INTO BOTH NOSTRILS DAILY Patient taking differently: PLACE 2 SPRAYS INTO BOTH NOSTRILS twice daily as needed for allergies 06/18/15  Yes Leslye Peer, MD  glucosamine-chondroitin 500-400 MG tablet Take 1 tablet by mouth 2 (two) times daily.   Yes Historical Provider, MD  levothyroxine (SYNTHROID, LEVOTHROID) 50 MCG tablet Take 50 mcg by mouth daily.     Yes Historical Provider, MD  methylcellulose (ARTIFICIAL TEARS) 1 % ophthalmic solution Place 1 drop into both eyes daily as needed (allergies/dry eyes).    Yes Historical Provider, MD  Omega-3 Fatty Acids (FISH OIL) 1000 MG CAPS Take 1 capsule by mouth every evening.    Yes Historical  Provider, MD  oxyCODONE (OXY IR/ROXICODONE) 5 MG immediate release tablet Take 5-10 mg by mouth every 4 (four) hours as needed for pain. 03/10/16  Yes Historical Provider, MD  OXYGEN 24/7- 2lpm with sleep and 2-4 with exertion   Yes Historical Provider, MD  PE-GG-APAP & PE-DPH-APAP (MUCINEX SINUS-MAX DAY/NIGHT) Liquid MISC Take 5 mLs by mouth every 12 (twelve) hours as needed (allergies).   Yes Historical Provider, MD  PROAIR HFA 108 (90 Base) MCG/ACT inhaler INHALE UP TO 2 PUFFS EVERY 4 HOURS AS NEEDED Patient taking differently: INHALE UP TO 2 PUFFS EVERY 4 HOURS AS NEEDED for shortness of breath/wheezing 02/23/16  Yes Leslye Peerobert S Byrum, MD  SPIRIVA HANDIHALER 18 MCG inhalation capsule USE ONE (1) CAPSULE INHALATION ONCE DAILY Patient taking differently: USE ONE (1) CAPSULE INHALATION every evening 12/10/15  Yes Leslye Peerobert S Byrum, MD  feeding supplement (BOOST / RESOURCE BREEZE) LIQD Take 1 Container by mouth 3 (three) times daily between meals. 04/06/16   [place] Cyndie MullL Johnson, MD  HYDROcodone-acetaminophen (NORCO) 7.5-325 MG tablet Take 1 tablet by mouth 3 (three) times daily as needed. 04/06/16   [place] Cyndie MullL Johnson, MD  levofloxacin (LEVAQUIN) 500 MG tablet Take 1 tablet (500 mg total) by mouth daily. Patient not taking: Reported on 03/03/2016 02/10/16   Leslye Peerobert S Byrum, MD    Multiple Vitamin (MULTIVITAMIN WITH MINERALS) TABS tablet Take 1 tablet by mouth daily. 04/07/16   [place] Cyndie MullL Johnson, MD  oxyCODONE-acetaminophen (PERCOCET/ROXICET) 5-325 MG tablet Take 1 tablet by mouth every 4 (four) hours as needed for severe pain. Patient not taking: Reported on 03/29/2016 03/03/16   Azalia BilisKevin Campos, MD  predniSONE (DELTASONE) 10 MG tablet Take 4 tablets for 3 days, 3 tablets for 3 days, 2 tablets for 3 days, 1 tablet for 3 days Patient not taking: Reported on 03/03/2016 02/10/16   Leslye Peerobert S Byrum, MD  Respiratory Therapy Supplies (FLUTTER) DEVI Use as directed 08/08/14   Nyoka CowdenMichael B Wert, MD   Allergies  Allergen Reactions  . Penicillins Anaphylaxis    Has patient had a PCN reaction causing immediate rash, facial/tongue/throat swelling, SOB or lightheadedness with hypotension: yes Has patient had a PCN reaction causing severe rash involving mucus membranes or skin necrosis: unknown Has patient had a PCN reaction that required hospitalization: unknown Has patient had a PCN reaction occurring within the last 10 years: no If all of the above answers are "NO", then may proceed with Cephalosporin use. HAS TOLERATED AMPICILLIN SINCE RX AS CHILD   . Promethazine Hcl Diarrhea and Nausea And Vomiting   Review of Systems + for pain in shoulder, some shortness of breath even while resting.   Physical Exam Thin elderly appearing lady resting in bed S1 S2 Clear breath sounds anterior lung fields Abdomen soft R arm sling Awake alert Pursed lip breathing, but no accessory muscle use other wise.   Vital Signs: BP 122/71 (BP Location: Left Arm)   Pulse (!) 106   Temp 98.2 F (36.8 C) (Oral)   Resp 18   Ht 5\' 1"  (1.549 m)   Wt 47.1 kg (103 lb 13.4 oz)   SpO2 100%   BMI 19.62 kg/m  Pain Assessment: 0-10   Pain Score: 0-No pain   SpO2: SpO2: 100 % O2 Device:SpO2: 100 % O2 Flow Rate: .O2 Flow Rate (L/min): 2 L/min  IO: Intake/output summary: No intake or output data in the  24 hours ending 04/07/16 1504  LBM: Last BM Date: 04/06/16 Baseline Weight: Weight: 45.4 kg (100 lb 1.4  oz) Most recent weight: Weight: 47.1 kg (103 lb 13.4 oz)     Palliative Assessment/Data:   Flowsheet Rows   Flowsheet Row Most Recent Value  Intake Tab  Referral Department  Hospitalist  Unit at Time of Referral  Med/Surg Unit  Palliative Care Primary Diagnosis  Pulmonary  Palliative Care Type  New Palliative care  Reason for referral  Clarify Goals of Care, Non-pain Symptom  Date first seen by Palliative Care  04/07/16  Clinical Assessment  Palliative Performance Scale Score  30%  Pain Max last 24 hours  5  Pain Min Last 24 hours  4  Dyspnea Max Last 24 Hours  4  Dyspnea Min Last 24 hours  3  Nausea Max Last 24 Hours  4  Nausea Min Last 24 Hours  3  Psychosocial & Spiritual Assessment  Palliative Care Outcomes  Patient/Family meeting held?  Yes  Who was at the meeting?  patient husband friend daughter   Palliative Care Outcomes  Clarified goals of care      Time In:  1300 Time Out:  1400 Time Total60:   Greater than 50%  of this time was spent counseling and coordinating care related to the above assessment and plan.  Signed by: Rosalin Hawking, MD  [phone]  Please contact Palliative Medicine Team phone at [phone] for questions and concerns.  For individual provider: See Loretha Stapler

## 2016-04-07 NOTE — Progress Notes (Signed)
OT Cancellation Note  Patient Details Name: Cheyenne [name] MRN: [id number] DOB: 1945-07-08   Cancelled Treatment:    Reason Eval/Treat Not Completed: Fatigue/lethargy limiting ability to participate.  Pt very sleepy and is just not up to OT right now. Will check back.  Stevan Eberwein 04/07/2016, 3:41 PM  Cheyenne [name], OTR/L [phone] 04/07/2016

## 2016-04-07 NOTE — [place] FL2 (Signed)
[place] MEDICAID FL2 LEVEL OF CARE SCREENING TOOL     IDENTIFICATION  Patient Name: Cheyenne [name] Birthdate: 05-16-1945 Sex: female Admission Date (Current Location): 03/29/2016  [facility] and [place] Number:  Producer, television/film/video and Address:  [facility],  [address] [place] [address]      Provider Number: [id number]  Attending Physician Name and Address:  Rodolph Bong, MD  Relative Name and Phone Number:       Current Level of Care: Hospital Recommended Level of Care: Skilled Nursing Facility Prior Approval Number:    Date Approved/Denied:   PASRR Number:    Discharge Plan: SNF    Current Diagnoses: Patient Active Problem List   Diagnosis Date Noted  . Tachycardia 04/07/2016  . Right arm fracture   . SOB (shortness of breath)   . Malnutrition of moderate degree 04/06/2016  . Pneumothorax on left   . Shortness of breath   . Chest tube in place   . Hypokalemia 03/29/2016  . Pneumothorax 03/29/2016  . COPD exacerbation (HCC) 11/13/2015  . Acute bronchitis 05/22/2015  . Respiratory failure with hypercapnia (HCC) 08/05/2014  . Allergic rhinitis 01/23/2014  . Lung nodule 05/04/2012  . COPD  D  06/02/2009  . HYPOTHYROIDISM 05/07/2009  . VITAMIN B12 DEFICIENCY 05/07/2009  . ANXIETY 05/07/2009  . Alcohol abuse: Hx of.  05/07/2009  . DEPRESSION 05/07/2009  . ASTHMA 05/07/2009  . DEGENERATIVE JOINT DISEASE 05/07/2009  . OSTEOPENIA 05/07/2009    Orientation RESPIRATION BLADDER Height & Weight     Self, Time, Situation, Place  O2 (2L) Continent Weight: 103 lb 13.4 oz (47.1 kg) Height:   (154.9 cm)  BEHAVIORAL SYMPTOMS/MOOD NEUROLOGICAL BOWEL NUTRITION STATUS      Incontinent Diet (Regular diet)  AMBULATORY STATUS COMMUNICATION OF NEEDS Skin   Limited Assist Verbally Normal                       Personal Care Assistance Level of Assistance  Bathing, Feeding, Dressing Bathing Assistance: Limited assistance Feeding  assistance: Independent Dressing Assistance: Limited assistance     Functional Limitations Info             SPECIAL CARE FACTORS FREQUENCY  PT (By licensed PT), OT (By licensed OT)     PT Frequency: 5 OT Frequency: 5            Contractures Contractures Info: Not present    Additional Factors Info  Code Status, Allergies Code Status Info: DNR Allergies Info: PENICILLINS, PROMETHAZINE HCL            Current Medications (04/07/2016):  This is the current hospital active medication list Current Facility-Administered Medications  Medication Dose Route Frequency Provider Last Rate Last Dose  . 0.9 %  sodium chloride infusion   Intravenous Continuous Rodolph Bong, MD 75 mL/hr at 04/07/16 1637    . acetaminophen (TYLENOL) tablet 650 mg  650 mg Oral Q6H PRN Therisa Doyne, MD   650 mg at 04/06/16 2131   Or  . acetaminophen (TYLENOL) suppository 650 mg  650 mg Rectal Q6H PRN Therisa Doyne, MD      . budesonide (PULMICORT) nebulizer solution 0.5 mg  0.5 mg Nebulization BID Jose Alexis Frock, MD   0.5 mg at 04/07/16 0750  . buPROPion (WELLBUTRIN XL) 24 hr tablet 150 mg  150 mg Oral Daily Therisa Doyne, MD   150 mg at 04/07/16 0951  . diazepam (VALIUM) tablet 5  mg  5 mg Oral QHS PRN [place] Cyndie MullL Johnson, MD   5 mg at 04/06/16 2131  . escitalopram (LEXAPRO) tablet 10 mg  10 mg Oral Daily Therisa DoyneAnastassia Doutova, MD   10 mg at 04/07/16 0949  . feeding supplement (BOOST / RESOURCE BREEZE) liquid 1 Container  1 Container Oral TID BM Hollice EspySendil K Krishnan, MD   1 Container at 04/07/16 1410  . guaiFENesin (MUCINEX) 12 hr tablet 600 mg  600 mg Oral BID Therisa DoyneAnastassia Doutova, MD   600 mg at 04/07/16 0951  . heparin injection 5,000 Units  5,000 Units Subcutaneous Q8H Jose Alexis FrockAngelo A de Dios, MD   5,000 Units at 04/07/16 1502  . iopamidol (ISOVUE-370) 76 % injection           . ipratropium (ATROVENT) nebulizer solution 0.5 mg  0.5 mg Nebulization Q6H Rodolph Bonganiel V Thompson, MD   0.5 mg at  04/07/16 1700  . ipratropium-albuterol (DUONEB) 0.5-2.5 (3) MG/3ML nebulizer solution 3 mL  3 mL Nebulization Q4H PRN Jose Alexis FrockAngelo A de Dios, MD   3 mL at 04/07/16 0447  . levalbuterol (XOPENEX) nebulizer solution 0.63 mg  0.63 mg Nebulization Q6H Rodolph Bonganiel V Thompson, MD   0.63 mg at 04/07/16 1700  . levothyroxine (SYNTHROID, LEVOTHROID) tablet 50 mcg  50 mcg Oral Daily Therisa DoyneAnastassia Doutova, MD   50 mcg at 04/07/16 0807  . LORazepam (ATIVAN) tablet 0.5 mg  0.5 mg Oral Q8H PRN Rodolph Bonganiel V Thompson, MD   0.5 mg at 04/07/16 0949  . MEDLINE mouth rinse  15 mL Mouth Rinse BID Therisa DoyneAnastassia Doutova, MD   15 mL at 04/06/16 1050  . multivitamin with minerals tablet 1 tablet  1 tablet Oral Daily Hollice EspySendil K Krishnan, MD   1 tablet at 04/07/16 0951  . ondansetron (ZOFRAN) tablet 4 mg  4 mg Oral Q6H PRN Therisa DoyneAnastassia Doutova, MD       Or  . ondansetron (ZOFRAN) injection 4 mg  4 mg Intravenous Q6H PRN Therisa DoyneAnastassia Doutova, MD      . oxyCODONE (Oxy IR/ROXICODONE) immediate release tablet 5 mg  5 mg Oral Q4H PRN Roma KayserKatherine P Schorr, NP   5 mg at 04/07/16 1621  . oxyCODONE-acetaminophen (PERCOCET/ROXICET) 5-325 MG per tablet 1 tablet  1 tablet Oral Q4H PRN Leanne ChangKatherine P Schorr, NP   1 tablet at 04/07/16 1620  . sodium chloride 0.9 % injection           . sodium chloride flush (NS) 0.9 % injection 3 mL  3 mL Intravenous Q12H Therisa DoyneAnastassia Doutova, MD   3 mL at 04/07/16 [id number]     Discharge Medications: Please see discharge summary for a list of discharge medications.  Relevant Imaging Results:  Relevant Lab Results:   Additional Information SS#: [id number]  Donnie CoffinErin M Lani Havlik, LCSW

## 2016-04-07 NOTE — Progress Notes (Signed)
PROGRESS NOTE    Cheyenne [name]  ONG:[id number] DOB: 11-26-1945 DOA: 03/29/2016 PCP: Cheyenne [name],Cheyenne STEWART, MD    Brief Narrative:  Patient is a 70 year old female, being seen by Cheyenne, Delton [name] for severe COPD, on 3L O2 24/7, former smoker, history of ETOH abuse, chronic pain, comes in with worsening SOB. Patient was last seen by Cheyenne. Delton [name] at the office in October 2017. She was noted to be in mild flare for COPD and was given prednisone taper over one week and levofloxacin for 7 days. She improved and went back to baseline. During that office visit note, she mentioned about having a fall.  [facility] and COPD exacerbation s/p chest tube.  [facility]   Patient with severe COPD, on 3 L oxygen 24/7, with recent acute exacerbation of COPD likely secondary to bronchitis/PNA, comes in after repeated falls over >pneumothorax on the left lung base on chest x-ray. S/P L CT by [facility] on 12/4. Patient was to be discharged yesterday however complains of shortness of breath and noted to be anxious and a such discharge was canceled. Patient tachycardic with complaints of shortness of breath. CT angiogram pending.   Assessment & Plan:   Principal Problem:   Respiratory failure with hypercapnia (HCC) Active Problems:   COPD exacerbation (HCC)   Pneumothorax   Shortness of breath   Tachycardia   Hypokalemia   Chest tube in place   Pneumothorax on left   Malnutrition of moderate degree  #1 acute on chronic hypoxemic respiratory failure with hypercapnia secondary to acute COPD exacerbation/significant pneumothorax on the left secondary to fall Patient status post left chest tube 03/29/2016 per cardiothoracic surgery (Cheyenne. Tyrone SageGerhardt), chest tube removed 12/11 with improvement on repeat x-rays with no recurrence. Recurrent pneumothorax left 12/ 6 after being briefly placed on waterseal 12 /5. 04/04/2016 for expansion of left lung on waterseal on 2 L oxygen  baseline is 3 L. Patient was seen by critical care medicine and placed on Pulmicort nebs twice daily, duo nebs 4 times a day with goal to keep sats greater than 88%. Chest 2 removed repeat films good and patient given clearance by pulmonary for discharge with outpatient follow-up with Cheyenne. Delton [name] in 2 weeks. Patient was supposed to be discharged however complains of shortness of breath and noted to be tachycardic. Per Family patient with significant immobility prior to admission. Will check a CT angiogram to rule out PE. Follow.  #2 tachycardia Patient noted to be tachycardic however patient with anxiety disorder. Check a EKG. He to patient's complaints of shortness of breath we'll get a CT chest angiogram to rule out PE. Patient is not anemic. Change duo nebs to Xopenex and Atrovent. Placed on Ativan when necessary. Follow.  #3 chronic pain and anxiety disorder Continue current pain regimen.  #4 right humerus fracture Patient's films have been seen by patient's orthopod, Cheyenne. Mack Hookavid Thompson who recommended to continue sarmiento brace, continue range of motion with outpatient follow-up in one month.    DVT prophylaxis: Heparin Code Status: DO NOT RESUSCITATE Family Communication: Updated patient and best friend at bedside. Disposition Plan:  Skilled nursing facility with palliative care following.   Consultants:   PCCM Cheyenne. Lucy Chrisios 03/29/2016  Palliative care [name] 1 04/07/2016  CVTS: SDr [name]  Procedures:   CT chest without contrast 03/29/2016  CT C-spine 03/29/2016  CT head 03/29/2016  Chest x-ray 04/06/2016, 04/05/2016, 04/04/2016, 04/03/2016, 04/02/2016, 04/01/2016, 03/31/2016, 03/30/2016, 03/29/2016  Plain films of the right  knee 03/29/2016  Plain films of the right humerus 04/05/2016  Chest tube placement per Cheyenne [name]  Antimicrobials:   IV Unasyn 03/31/2016>>>> 04/02/2016  IV aztreonam 03/29/2016>>>> 03/31/2016  IV vancomycin 03/29/2016>>>>  03/31/2016   Subjective: Patient laying in the fetal position. Persistent patient got base and is extremely fatigued and short of breath and noted to be very anxious. Patient denies any chest pain.  Objective: Vitals:   04/07/16 0450 04/07/16 0740 04/07/16 1443 04/07/16 1537  BP: 138/73  122/71   Pulse: (!) 106  (!) 144   Resp: 20  18   Temp: 98.2 F (36.8 C)   97.2 F (36.2 C)  TempSrc: Oral   Oral  SpO2: 100% 100% 100%   Weight:      Height:       No intake or output data in the 24 hours ending 04/07/16 1557 Filed Weights   03/29/16 2311 04/03/16 1500  Weight: 45.4 kg (100 lb 1.4 oz) 47.1 kg (103 lb 13.4 oz)    Examination:  General exam: Laying in bed in the fetal position. Respiratory system: Clear to auscultation. Poor to fair air movement. No wheezing.  Cardiovascular system: Tachycardia. No JVD, murmurs, rubs, gallops or clicks. No pedal edema. Gastrointestinal system: Abdomen is nondistended, soft and nontender. No organomegaly or masses felt. Normal bowel sounds heard. Central nervous system: Alert and oriented. No focal neurological deficits. Extremities: Symmetric 5 x 5 power. Skin: No rashes, lesions or ulcers Psychiatry: Judgement and insight appear fair. Mood & affect appropriate.     Data Reviewed: I have personally reviewed following labs and imaging studies  CBC:  Recent Labs Lab 04/02/16 0337 04/03/16 0343 04/05/16 0536 04/06/16 0526  WBC 13.2* 13.7* 14.9* 14.5*  HGB 9.6* 10.1* 10.3* 11.2*  HCT 29.6* 31.7* 31.3* 34.0*  MCV 93.4 94.1 92.3 91.2  PLT 263 320 380 447*   Basic Metabolic Panel:  Recent Labs Lab 04/02/16 0337 04/03/16 0343 04/04/16 0541 04/05/16 0656 04/06/16 0526  NA 140 135  --  135 136  K 2.8* 3.2*  --  2.7* 4.8  CL 92* 92*  --  89* 95*  CO2 39* 34*  --  37* 33*  GLUCOSE 109* 105*  --  104* 101*  BUN 8 9  --  6 8  CREATININE 0.36* 0.39*  --  0.50 0.41*  CALCIUM 8.9 8.8*  --  9.0 9.3  MG 1.8  --  1.8 1.8 2.2  PHOS  2.7  --  3.6 3.5 3.5   GFR: Estimated Creatinine Clearance: 49.3 mL/min (by C-G formula based on SCr of 0.41 mg/dL (L)). Liver Function Tests: No results for input(s): AST, ALT, ALKPHOS, BILITOT, PROT, ALBUMIN in the last 168 hours. No results for input(s): LIPASE, AMYLASE in the last 168 hours. No results for input(s): AMMONIA in the last 168 hours. Coagulation Profile: No results for input(s): INR, PROTIME in the last 168 hours. Cardiac Enzymes: No results for input(s): CKTOTAL, CKMB, CKMBINDEX, TROPONINI in the last 168 hours. BNP (last 3 results) No results for input(s): PROBNP in the last 8760 hours. HbA1C: No results for input(s): HGBA1C in the last 72 hours. CBG: No results for input(s): GLUCAP in the last 168 hours. Lipid Profile: No results for input(s): CHOL, HDL, LDLCALC, TRIG, CHOLHDL, LDLDIRECT in the last 72 hours. Thyroid Function Tests: No results for input(s): TSH, T4TOTAL, FREET4, T3FREE, THYROIDAB in the last 72 hours. Anemia Panel: No results for input(s): VITAMINB12, FOLATE, FERRITIN, TIBC, IRON, RETICCTPCT in the  last 72 hours. Sepsis Labs:  Recent Labs Lab 04/01/16 0327 04/03/16 0343  PROCALCITON <0.10 <0.10    Recent Results (from the past 240 hour(s))  Culture, blood (routine x 2)     Status: None   Collection Time: 03/29/16  4:51 PM  Result Value Ref Range Status   Specimen Description BLOOD LEFT ANTECUBITAL  Final   Special Requests BOTTLES DRAWN AEROBIC ONLY 5CC  Final   Culture   Final    NO GROWTH 5 DAYS [facility]    Report Status 04/03/2016 FINAL  Final  Culture, blood (routine x 2)     Status: None   Collection Time: 03/29/16  4:53 PM  Result Value Ref Range Status   Specimen Description BLOOD LEFT HAND  Final   Special Requests BOTTLES DRAWN AEROBIC ONLY 5CC  Final   Culture   Final    NO GROWTH 5 DAYS [facility] at [facility]    Report Status 04/03/2016 FINAL  Final  MRSA PCR Screening     Status:  None   Collection Time: 03/30/16 12:22 AM  Result Value Ref Range Status   MRSA by PCR NEGATIVE NEGATIVE Final    Comment:        The GeneXpert MRSA Assay (FDA approved for NASAL specimens only), is one component of a comprehensive MRSA colonization surveillance program. It is not intended to diagnose MRSA infection nor to guide or monitor treatment for MRSA infections.          Radiology Studies: Dg Chest Port 1 View  Result Date: 04/06/2016 CLINICAL DATA:  Follow-up pneumothorax EXAM: PORTABLE CHEST 1 VIEW COMPARISON:  Portable chest x-ray of April 05, 2016 and July 31, 2015. FINDINGS: The lungs are mildly hyperinflated. The pleural line demonstrated on yesterday's study on the left in the upper hemithorax is not evident today. There remains hyperlucency in the mid and lower left hemithorax which may reflect a pneumothorax lying anterior or posterior to re- inflated lung parenchyma or may be normal for the patient. The right lung is clear. The heart and pulmonary vascularity are normal. There is calcification in the wall of the aortic arch. A small amount of subcutaneous emphysema is noted on the left. IMPRESSION: Interval clearing of the small apical pneumothorax. No definite residual pneumothorax though there is hyperlucency in the inferior aspect of the left hemithorax which is similar to that seen on the July 31, 2015 study. Thoracic aortic atherosclerosis. Electronically Signed   By: David  Swazil[place]Jordan M.D.   On: 04/06/2016 07:55        Scheduled Meds: . budesonide (PULMICORT) nebulizer solution  0.5 mg Nebulization BID  . buPROPion  150 mg Oral Daily  . escitalopram  10 mg Oral Daily  . feeding supplement  1 Container Oral TID BM  . guaiFENesin  600 mg Oral BID  . heparin subcutaneous  5,000 Units Subcutaneous Q8H  . iopamidol      . ipratropium-albuterol  3 mL Nebulization QID  . levothyroxine  50 mcg Oral Daily  . mouth rinse  15 mL Mouth Rinse BID  . multivitamin  with minerals  1 tablet Oral Daily  . sodium chloride      . sodium chloride flush  3 mL Intravenous Q12H   Continuous Infusions:   LOS: 9 days    Time spent: 35 minutes    THOMPSON,DANIEL, MD [facility] Pager [phone]  If 7PM-7AM, please contact night-coverage [email or web address] Password [email or web address] 04/07/2016, 3:57 PM

## 2016-04-07 NOTE — Progress Notes (Signed)
Patient called out saying she felt short of breath. Upon evaluation, the patient's vitals were all within defined limits. This RN administered PRN breathing treatment and asked respiratory to come assess. After the breathing treatment, the patient reports she is no longer short of breath.

## 2016-04-07 NOTE — Progress Notes (Signed)
This RN informed the patient and her family that she will be staying another night on the unit due to her increased shortness of breath episode and will be reevaluated in the morning for possible discharge. Patient visibly anxious, having tremors, and turning slightly blue at the mouth. She was still complaining of some SOB, this RN increased her oxygen to 3L [place] and instructed the patient to take some deep breaths. The episode of SOB resolved, however, the patient was still very anxious. This RN reassured the patient and her family, who seemed to be understanding of the situation. Will continue to monitor.

## 2016-04-07 NOTE — Progress Notes (Signed)
Physical Therapy Treatment Patient Details Name: Cheyenne [name] MRN: [id number] DOB: 16-Jan-1946 Today's Date: 04/07/2016    History of Present Illness Patient is a 70 year old female past medical history of alcohol abuse, hypothyroidism and chronic respiratory failure with hypoxia secondary to COPD on 3 L nasal cannula at home who for the past month has had several major falls at home which led to R humeral fracture treated with splinting.Patient presented to the emergency room on 12/4 with worsening cough, shortness of breath , pneumothorax.ygenation. Cardiothoracic surgery consulted who placed a chest tube. . Patient admitted to  for COPD exacerbation with possible healthcare associated pneumonia worsened from pneumothorax.    PT Comments    Very fatigued after having sat on BSC x 30 minutes. HR remains 160-170. Taken manually.RN aware.  Follow Up Recommendations  SNF;Supervision/Assistance - 24 hour     Equipment Recommendations  Wheelchair (measurements PT)    Recommendations for Other Services       Precautions / Restrictions Precautions Precautions: Fall Precaution Comments: R humeral fx, monitor sats and HR Required Braces or Orthoses: Other Brace/Splint Other Brace/Splint: humeral splint Restrictions Other Position/Activity Restrictions: RUE    Mobility  Bed Mobility Overal bed mobility: Needs Assistance Bed Mobility: Supine to Sit     Supine to sit: Mod assist     General bed mobility comments: assist for trunk  Transfers Overall transfer level: Needs assistance   Transfers: Sit to/from Stand;Stand Pivot Transfers Sit to Stand: Mod assist         General transfer comment: bear hug like  support to stand and pivot from  [facility] to recliner.  The patient complains of not being able to breathe. Repositioned in recliner.  Sats 100% on 2 liters, 4/4 Dspnea, HR 174.   Ambulation/Gait                 Stairs            Wheelchair Mobility     Modified Rankin (Stroke Patients Only)       Balance Overall balance assessment: History of Falls;Needs assistance Sitting-balance support: Feet supported;Single extremity supported Sitting balance-Leahy Scale: Fair       Standing balance-Leahy Scale: Poor                      Cognition Arousal/Alertness: Awake/alert                          Exercises      General Comments        Pertinent Vitals/Pain Faces Pain Scale: Hurts little more Pain Location: right arm Pain Descriptors / Indicators: Aching;Sore Pain Intervention(s): Monitored during session    Home Living                      Prior Function            PT Goals (current goals can now be found in the care plan section) Progress towards PT goals: Progressing toward goals    Frequency    Min 3X/week      PT Plan Current plan remains appropriate    Co-evaluation             End of Session Equipment Utilized During Treatment: Oxygen Activity Tolerance: Patient limited by fatigue;Treatment limited secondary to medical complications (Comment) Patient left: in chair;with call bell/phone within reach;with family/visitor present     Time: [id number] PT Time Calculation (min) (ACUTE  ONLY): 13 min  Charges:  $Therapeutic Activity: 8-22 mins $Self Care/Home Management: 8-22                    G Codes:      Cheyenne [name] 04/07/2016, 3:27 PM

## 2016-04-07 NOTE — Progress Notes (Signed)
Physical Therapy Treatment Patient Details Name: Cheyenne [name] MRN: [id number] DOB: Aug 22, 1945 Today's Date: 04/07/2016    History of Present Illness Patient is a 70 year old female past medical history of alcohol abuse, hypothyroidism and chronic respiratory failure with hypoxia secondary to COPD on 3 L nasal cannula at home who for the past month has had several major falls at home which led to R humeral fracture treated with splinting.Patient presented to the emergency room on 12/4 with worsening cough, shortness of breath , pneumothorax.ygenation. Cardiothoracic surgery consulted who placed a chest tube. . Patient admitted to  for COPD exacerbation with possible healthcare associated pneumonia worsened from pneumothorax.    PT Comments    The patient is much more dyspneic, weaker  HR 160-170 with minimal mobility with transfers. RN aware. She reports needing to have a BM [facility]. [facility]  [facility] past few [facility].The patient now will benefit from  SNF if she can tolerate the therapy. Spouse and patient in agreement. Continue PT while in acute care.  Follow Up Recommendations  SNF;Supervision/Assistance - 24 hour     Equipment Recommendations  Wheelchair (measurements PT)    Recommendations for Other Services       Precautions / Restrictions Precautions Precautions: Fall Precaution Comments: R humeral fx, monitor sats and HR Required Braces or Orthoses: Other Brace/Splint Other Brace/Splint: humeral splint Restrictions Other Position/Activity Restrictions: RUE    Mobility  Bed Mobility Overal bed mobility: Needs Assistance Bed Mobility: Supine to Sit     Supine to sit: Mod assist     General bed mobility comments: assist for trunk  Transfers Overall transfer level: Needs assistance   Transfers: Sit to/from Stand;Stand Pivot Transfers           General transfer comment: bear hug like  support to stand and [facility].  assist to  lower to seat, the patient is noted to have 4/4 dyspnea on 2 liters, Sats 100%, HR174, RN NOTIFIED. hr REMAINED >160 THROUGHOUT.  Ambulation/Gait                 Stairs            Wheelchair Mobility    Modified Rankin (Stroke Patients Only)       Balance Overall balance assessment: History of Falls;Needs assistance Sitting-balance support: Feet supported;Single extremity supported Sitting balance-Leahy Scale: Fair       Standing balance-Leahy Scale: Poor                      Cognition Arousal/Alertness: Awake/alert                          Exercises      General Comments        Pertinent Vitals/Pain Faces Pain Scale: Hurts little more Pain Location: right arm    Home Living                      Prior Function            PT Goals (current goals can now be found in the care plan section) Progress towards PT goals: Not progressing toward goals - comment (THE  PATIENT IS MUCH MORE sob AND weaker. )    Frequency    Min 3X/week      PT Plan Discharge plan needs to be updated    Co-evaluation  End of Session Equipment Utilized During Treatment: Oxygen Activity Tolerance: Patient limited by fatigue;Treatment limited secondary to medical complications (Comment) Patient left: in chair;with call bell/phone within reach;with family/visitor present     Time: 1150-1230 PT Time Calculation (min) (ACUTE ONLY): 40 min  Charges:  $Therapeutic Activity: 23-37 mins $Self Care/Home Management: 8-22                    G Codes:      Cheyenne [name], Cheyenne [name] 04/07/2016, 3:17 PM

## 2016-04-07 NOTE — Clinical Social Work Note (Signed)
Clinical Social Work Assessment  Patient Details  Name: Cheyenne [name] MRN: [id number] Date of Birth: 13-Jun-1945  Date of referral:  04/07/16               Reason for consult:  Facility Placement                Permission sought to share information with:  Facility Sport and exercise psychologist, Family Supports Permission granted to share information::     Name::     Boehme Ziana  Agency::  SNF  Relationship::  Spouse  Contact Information:  [phone]  Housing/Transportation Living arrangements for the past 2 months:  [place] of Information:  Patient Patient Interpreter Needed:  None Criminal Activity/Legal Involvement Pertinent to Current Situation/Hospitalization:  No - Comment as needed Significant Relationships:  Spouse Lives with:  Spouse Do you feel safe going back to the place where you live?  Yes Need for family participation in patient care:  Yes (Comment)  Care giving concerns:  Patient family are concerned about patient ability to manage at home. Patient set up to ho home with home health.  Pt. Spouse reported frustration that patient was not seen by physical therapy before her discharge was placed 12/12, but had to stay another day due to shortness of breath. Patient spouse is requesting palliative care services and rehab.   Social Worker assessment / plan:  LCSWA met with patient husband, daughter and friend after request to speak with CSW. The patient and family are agreeable to physical therapy before going home. The family inquired about SNF process, [place] educated the patient about faxing out process and later providing a list of bed offers.  Patient family reports the patient has not had a drink in 71 years-as primary diagnosis reads alcohol abuse. Patient admitted for fall and shortness of breath.  LCSWA family concerned with diagnosis, LCSWA discussed concern to pt. RN.  Plan:Assist with SNF placement.   Employment status:    Insurance  information:   Medicare PT Recommendations:  24 Hour Supervision Information / Referral to community resources:  [place]  Patient/Family's Response to care:  Family reports they are appreciative of care but disappoined in discharge process.    Patient/Family's Understanding of and Emotional Response to Diagnosis, Current Treatment, and Prognosis:   Family understands patient will need a higher level of care before returning home.   Emotional Assessment Appearance:  Appears stated age Attitude/Demeanor/Rapport:    Affect (typically observed):    Orientation:  Oriented to Self, Oriented to Place, Oriented to  Time, Oriented to Situation Alcohol / Substance use:  Not Applicable Psych involvement (Current and /or in the community):  No (Comment)  Discharge Needs  Concerns to be addressed:  Discharge Planning Concerns Readmission within the last 30 days:  No Current discharge risk:  Dependent with Mobility Barriers to Discharge:  Continued Medical Work up   [employer], LCSW 04/07/2016, 11:39 AM

## 2016-04-08 DIAGNOSIS — J189 Pneumonia, unspecified organism: Secondary | ICD-10-CM | POA: Clinically undetermined

## 2016-04-08 LAB — CBC WITH DIFFERENTIAL/PLATELET
BASOS ABS: 0 10*3/uL (ref 0.0–0.1)
BASOS PCT: 0 %
EOS ABS: 0.1 10*3/uL (ref 0.0–0.7)
Eosinophils Relative: 1 %
HEMATOCRIT: 32.8 % — AB (ref 36.0–46.0)
Hemoglobin: 10.5 g/dL — ABNORMAL LOW (ref 12.0–15.0)
Lymphocytes Relative: 11 %
Lymphs Abs: 1.8 10*3/uL (ref 0.7–4.0)
MCH: 29.7 pg (ref 26.0–34.0)
MCHC: 32 g/dL (ref 30.0–36.0)
MCV: 92.9 fL (ref 78.0–100.0)
MONO ABS: 1.4 10*3/uL — AB (ref 0.1–1.0)
MONOS PCT: 8 %
Neutro Abs: 14 10*3/uL — ABNORMAL HIGH (ref 1.7–7.7)
Neutrophils Relative %: 80 %
PLATELETS: 494 10*3/uL — AB (ref 150–400)
RBC: 3.53 MIL/uL — ABNORMAL LOW (ref 3.87–5.11)
RDW: 12.9 % (ref 11.5–15.5)
WBC: 17.3 10*3/uL — ABNORMAL HIGH (ref 4.0–10.5)

## 2016-04-08 LAB — BASIC METABOLIC PANEL
ANION GAP: 8 (ref 5–15)
BUN: 7 mg/dL (ref 6–20)
CO2: 30 mmol/L (ref 22–32)
CREATININE: 0.4 mg/dL — AB (ref 0.44–1.00)
Calcium: 9.3 mg/dL (ref 8.9–10.3)
Chloride: 95 mmol/L — ABNORMAL LOW (ref 101–111)
GFR calc Af Amer: >60 mL/min (ref >60)
Glucose, Bld: 110 mg/dL — ABNORMAL HIGH (ref 65–99)
Potassium: 4.4 mmol/L (ref 3.5–5.1)
SODIUM: 133 mmol/L — AB (ref 135–145)

## 2016-04-08 LAB — STREP PNEUMONIAE URINARY ANTIGEN: STREP PNEUMO URINARY ANTIGEN: NEGATIVE

## 2016-04-08 MED ORDER — DEXTROSE 5 % IV SOLN
1.0000 g | Freq: Three times a day (TID) | INTRAVENOUS | Status: DC
  Administered 2016-04-08 – 2016-04-09 (×4): 1 g via INTRAVENOUS
  Filled 2016-04-08 (×6): qty 1

## 2016-04-08 MED ORDER — GUAIFENESIN ER 600 MG PO TB12
1200.0000 mg | ORAL_TABLET | Freq: Two times a day (BID) | ORAL | Status: DC
  Administered 2016-04-08 (×2): 1200 mg via ORAL
  Filled 2016-04-08 (×2): qty 2

## 2016-04-08 MED ORDER — VANCOMYCIN HCL IN DEXTROSE 1-5 GM/200ML-% IV SOLN
1000.0000 mg | INTRAVENOUS | Status: DC
  Administered 2016-04-09: 1000 mg via INTRAVENOUS
  Filled 2016-04-08 (×2): qty 200

## 2016-04-08 MED ORDER — VANCOMYCIN HCL IN DEXTROSE 1-5 GM/200ML-% IV SOLN
1000.0000 mg | Freq: Once | INTRAVENOUS | Status: AC
  Administered 2016-04-08: 1000 mg via INTRAVENOUS
  Filled 2016-04-08: qty 200

## 2016-04-08 NOTE — Progress Notes (Signed)
PROGRESS NOTE    Cheyenne [name]  ZOX:[id number] DOB: 1945-06-22 DOA: 03/29/2016 PCP: [name], MD    Brief Narrative:  Patient is a 70 year old female, being seen by Dr, [name] for severe COPD, on 3L O2 24/7, former smoker, history of ETOH abuse, chronic pain, comes in with worsening SOB. Patient was last seen by Dr. [name] at the office in October 2017. She was noted to be in mild flare for COPD and was given prednisone taper over one week and levofloxacin for 7 days. She improved and went back to baseline. During that office visit note, she mentioned about having a fall.  Pt admitted by [facility] service with a pneumothorax and COPD exacerbation s/p chest tube.  Transferred to [facility] 12/10.   Patient with severe COPD, on 3 L oxygen 24/7, with recent acute exacerbation of COPD likely secondary to bronchitis/PNA, comes in after repeated falls over >pneumothorax on the left lung base on chest x-ray. S/P L CT by [facility] on 12/4. Patient was to be discharged yesterday however complains of shortness of breath and noted to be anxious and a such discharge was canceled. Patient tachycardic with complaints of shortness of breath. CT angiogram pending.   Assessment & Plan:   Principal Problem:   Respiratory failure with hypercapnia (HCC) Active Problems:   COPD exacerbation (HCC)   Pneumothorax   Shortness of breath   Tachycardia   Hypokalemia   Chest tube in place   Pneumothorax on left   Malnutrition of moderate degree   Right arm fracture   SOB (shortness of breath)   HCAP (healthcare-associated pneumonia)  #1 acute on chronic hypoxemic respiratory failure with hypercapnia secondary to acute COPD exacerbation/significant pneumothorax on the left secondary to fall Patient status post left chest tube 03/29/2016 per cardiothoracic surgery (Dr. [name]), chest tube removed 12/11 with improvement on repeat x-rays with no recurrence. Recurrent pneumothorax left 12/ 6 after being briefly  placed on waterseal 12 /5. 04/04/2016 for expansion of left lung on waterseal on 2 L oxygen baseline is 3 L. Patient was seen by critical care medicine and placed on Pulmicort nebs twice daily, duo nebs 4 times a day with goal to keep sats greater than 88%. Chest 2 removed repeat films good and patient given clearance by pulmonary for discharge with outpatient follow-up with Dr. [name] in 2 weeks. Patient was supposed to be discharged however complains of shortness of breath and noted to be tachycardic. Per Family patient with significant immobility prior to admission. CT angiogram chest negative for pulmonary emboli however consistent with right lower lobe pneumonia. Sputum Gram stain and culture. Check a urine Legionella antigen. Check a urine pneumococcus antigen. Check blood cultures 2. Placed empirically on IV vancomycin and IV cefepime.   #2 tachycardia Patient noted to be tachycardic however patient with anxiety disorder. Likely secondary to pneumonia noted on CT angiogram chest. EKG consistent with a sinus tachycardia. CT angiogram chest negative for pulmonary emboli and consistent with right lower lobe airspace disease concerning for pneumonia. Patient has been pancultured. Continue nebulizer treatments. Check a sputum Gram stain and culture. Place empirically on IV vancomycin and IV cefepime. Follow.  #3 chronic pain and anxiety disorder Continue current pain regimen.  #4 right humerus fracture Patient's films have been seen by patient's orthopod, [name] who recommended to continue sarmiento brace, continue range of motion with outpatient follow-up in one month.  #5 healthcare associated pneumonia Over the past 1-2 days patient had complaints of worsening shortness of breath  and some chest pain noted to be tachycardic. CT angiogram of the chest which was done was negative for pulmonary emboli however was consistent with a right lower lobe infiltrate. Patient with a worsening  leukocytosis with a white count of 17.3. Patient afebrile. Check a sputum Gram stain and culture. Check a urine Legionella antigen. Check a urine pneumococcus antigen. Check blood cultures 2. Place empirically on IV vancomycin and IV cefepime.    DVT prophylaxis: Heparin Code Status: DO NOT RESUSCITATE Family Communication: Updated patient and best friend at bedside. Disposition Plan:  Skilled nursing facility with palliative care following, once on oral antibiotics with clinical improvement.   Consultants:   PCCM [name] 03/29/2016  Palliative care [name] 1 04/07/2016  CVTS: SDr [name]  Procedures:   CT chest without contrast 03/29/2016  CT C-spine 03/29/2016  CT head 03/29/2016  Chest x-ray 04/06/2016, 04/05/2016, 04/04/2016, 04/03/2016, 04/02/2016, 04/01/2016, 03/31/2016, 03/30/2016, 03/29/2016  Plain films of the right knee 03/29/2016  Plain films of the right humerus 04/05/2016  Chest tube placement per Dr Lyn HenriGerheardt  CT angio chest 04/07/2016.  Antimicrobials:   IV Unasyn 03/31/2016>>>> 04/02/2016  IV aztreonam 03/29/2016>>>> 03/31/2016  IV vancomycin 03/29/2016>>>> 03/31/2016  IV vancomycin 04/08/2016  IV cefepime 04/08/2016   Subjective: Patient sitting up in bed eating dinner. Patient states she's feeling some better with improved shortness of breath however still fatigued. Anxiety has improved. Patient denies any chest pain.   Objective: Vitals:   04/08/16 0444 04/08/16 0738 04/08/16 1347 04/08/16 1358  BP: 120/68  135/60   Pulse: (!) 124 (!) 105 (!) 108   Resp: 20 (!) 22 20   Temp: 97.7 F (36.5 C)  98.6 F (37 C)   TempSrc: Oral  Oral   SpO2: 100% 99% 100% 94%  Weight:      Height:        Intake/Output Summary (Last 24 hours) at 04/08/16 1726 Last data filed at 04/08/16 1522  Gross per 24 hour  Intake           1847.5 ml  Output                2 ml  Net           1845.5 ml   Filed Weights   03/29/16 2311 04/03/16 1500  Weight:  45.4 kg (100 lb 1.4 oz) 47.1 kg (103 lb 13.4 oz)    Examination:  General exam: Sitting up eating breakfast. Respiratory system: Clear to auscultation. Poor to fair air movement. No wheezing.  Cardiovascular system: RRR. No JVD, murmurs, rubs, gallops or clicks. No pedal edema. Gastrointestinal system: Abdomen is nondistended, soft and nontender. No organomegaly or masses felt. Normal bowel sounds heard. Central nervous system: Alert and oriented. No focal neurological deficits. Extremities: Symmetric 5 x 5 power. Skin: No rashes, lesions or ulcers Psychiatry: Judgement and insight appear fair. Mood & affect appropriate.     Data Reviewed: I have personally reviewed following labs and imaging studies  CBC:  Recent Labs Lab 04/02/16 0337 04/03/16 0343 04/05/16 0536 04/06/16 0526 04/08/16 0548  WBC 13.2* 13.7* 14.9* 14.5* 17.3*  NEUTROABS  --   --   --   --  14.0*  HGB 9.6* 10.1* 10.3* 11.2* 10.5*  HCT 29.6* 31.7* 31.3* 34.0* 32.8*  MCV 93.4 94.1 92.3 91.2 92.9  PLT 263 320 380 447* 494*   Basic Metabolic Panel:  Recent Labs Lab 04/02/16 0337 04/03/16 0343 04/04/16 0541 04/05/16 0656 04/06/16 0526 04/08/16 0548  NA  140 135  --  135 136 133*  K 2.8* 3.2*  --  2.7* 4.8 4.4  CL 92* 92*  --  89* 95* 95*  CO2 39* 34*  --  37* 33* 30  GLUCOSE 109* 105*  --  104* 101* 110*  BUN 8 9  --  6 8 7   CREATININE 0.36* 0.39*  --  0.50 0.41* 0.40*  CALCIUM 8.9 8.8*  --  9.0 9.3 9.3  MG 1.8  --  1.8 1.8 2.2  --   PHOS 2.7  --  3.6 3.5 3.5  --    GFR: Estimated Creatinine Clearance: 49.3 mL/min (by C-G formula based on SCr of 0.4 mg/dL (L)). Liver Function Tests: No results for input(s): AST, ALT, ALKPHOS, BILITOT, PROT, ALBUMIN in the last 168 hours. No results for input(s): LIPASE, AMYLASE in the last 168 hours. No results for input(s): AMMONIA in the last 168 hours. Coagulation Profile: No results for input(s): INR, PROTIME in the last 168 hours. Cardiac Enzymes: No  results for input(s): CKTOTAL, CKMB, CKMBINDEX, TROPONINI in the last 168 hours. BNP (last 3 results) No results for input(s): PROBNP in the last 8760 hours. HbA1C: No results for input(s): HGBA1C in the last 72 hours. CBG: No results for input(s): GLUCAP in the last 168 hours. Lipid Profile: No results for input(s): CHOL, HDL, LDLCALC, TRIG, CHOLHDL, LDLDIRECT in the last 72 hours. Thyroid Function Tests: No results for input(s): TSH, T4TOTAL, FREET4, T3FREE, THYROIDAB in the last 72 hours. Anemia Panel: No results for input(s): VITAMINB12, FOLATE, FERRITIN, TIBC, IRON, RETICCTPCT in the last 72 hours. Sepsis Labs:  Recent Labs Lab 04/03/16 0343  PROCALCITON <0.10    Recent Results (from the past 240 hour(s))  MRSA PCR Screening     Status: None   Collection Time: 03/30/16 12:22 AM  Result Value Ref Range Status   MRSA by PCR NEGATIVE NEGATIVE Final    Comment:        The GeneXpert MRSA Assay (FDA approved for NASAL specimens only), is one component of a comprehensive MRSA colonization surveillance program. It is not intended to diagnose MRSA infection nor to guide or monitor treatment for MRSA infections.          Radiology Studies: Ct Angio Chest Pe W Or Wo Contrast  Result Date: 04/07/2016 CLINICAL DATA:  Shortness of breath, tachycardia EXAM: CT ANGIOGRAPHY CHEST WITH CONTRAST TECHNIQUE: Multidetector CT imaging of the chest was performed using the standard protocol during bolus administration of intravenous contrast. Multiplanar CT image reconstructions and MIPs were obtained to evaluate the vascular anatomy. CONTRAST:  56.6 mL Isovue 370 COMPARISON:  03/29/2016, 05/12/2012 FINDINGS: Cardiovascular: Satisfactory opacification of the pulmonary arteries to the segmental level. No evidence of pulmonary embolism. Normal heart size. No pericardial effusion. Thoracic aortic atherosclerosis. Coronary artery atherosclerosis of the LAD, ramus intermedius and RCA.  Mediastinum/Nodes: No enlarged mediastinal, hilar, or axillary lymph nodes. Thyroid gland, trachea, and esophagus demonstrate no significant findings. Right lower lobe airspace disease. Lungs/Pleura: No pneumothorax. Severe bilateral emphysematous changes most severe in the left lower lobe. No pleural effusion. Right lower lobe airspace disease. 7 mm right lower lobe pulmonary nodule unchanged compared with 05/12/2012. Upper Abdomen: No acute abnormality. Musculoskeletal: No acute osseous abnormality. Chronic T7 and T8 vertebral body compression fractures. Degenerative disc disease throughout the thoracic spine. Small amount of soft tissue emphysema in the left lateral chest wall. Review of the MIP images confirms the above findings. IMPRESSION: 1. No evidence of pulmonary embolus. 2.  Right lower lobe airspace disease concerning for pneumonia. 3.  Aortic Atherosclerosis (ICD10-170.0) 4.  Emphysema. ([id number](ICD10-J43.9) Electronically Signed   By: Elige KoHetal  Patel   On: 04/07/2016 18:16        Scheduled Meds: . budesonide (PULMICORT) nebulizer solution  0.5 mg Nebulization BID  . buPROPion  150 mg Oral Daily  . ceFEPime (MAXIPIME) IV  1 g Intravenous Q8H  . escitalopram  10 mg Oral Daily  . feeding supplement  1 Container Oral TID BM  . guaiFENesin  1,200 mg Oral BID  . heparin subcutaneous  5,000 Units Subcutaneous Q8H  . ipratropium  0.5 mg Nebulization Q6H  . levalbuterol  0.63 mg Nebulization Q6H  . levothyroxine  50 mcg Oral Daily  . mouth rinse  15 mL Mouth Rinse BID  . multivitamin with minerals  1 tablet Oral Daily  . sodium chloride flush  3 mL Intravenous Q12H  . [START ON 04/09/2016] vancomycin  1,000 mg Intravenous Q24H   Continuous Infusions:   LOS: 10 days    Time spent: 35 minutes    Damaris Abeln, MD [facility] Pager [phone]  If 7PM-7AM, please contact night-coverage [email or web address] Password [email or web address] 04/08/2016, 5:26 PM

## 2016-04-08 NOTE — Progress Notes (Signed)
Pharmacy Antibiotic Note  Cheyenne [name] is a 70 y.o. female admitted on 03/29/2016 with pneumonia.  Pharmacy has been consulted for vancomycin/cefepime dosing.  Antibiotics are being restarted after pt developed SOB prior to discharge.Pt WBC increased up to 17.3K.   Plan: Vancomycin 1000 IV every 24 hours.  Goal trough 15-20 mcg/mL.  Cefepime 1 gr IV q8h ( MD)   Height: 5\' 1"  (154.9 cm) Weight: 103 lb 13.4 oz (47.1 kg) IBW/kg (Calculated) : 47.8  Temp (24hrs), Avg:97.6 F (36.4 C), Min:97.2 F (36.2 C), Max:97.9 F (36.6 C)   Recent Labs Lab 04/02/16 0337 04/03/16 0343 04/05/16 0536 04/05/16 0656 04/06/16 0526 04/08/16 0548  WBC 13.2* 13.7* 14.9*  --  14.5* 17.3*  CREATININE 0.36* 0.39*  --  0.50 0.41* 0.40*    Estimated Creatinine Clearance: 49.3 mL/min (by C-G formula based on SCr of 0.4 mg/dL (L)).    Allergies  Allergen Reactions  . Penicillins Anaphylaxis    Has patient had a PCN reaction causing immediate rash, facial/tongue/throat swelling, SOB or lightheadedness with hypotension: yes Has patient had a PCN reaction causing severe rash involving mucus membranes or skin necrosis: unknown Has patient had a PCN reaction that required hospitalization: unknown Has patient had a PCN reaction occurring within the last 10 years: no If all of the above answers are "NO", then may proceed with Cephalosporin use. HAS TOLERATED AMPICILLIN SINCE RX AS CHILD   . Promethazine Hcl Diarrhea and Nausea And Vomiting    Antimicrobials this admission: 12/4 azactam >> 12/6 12/4 vancomycin >> 12/6,  12/14 12/6 Unasyn >> 12/8 12/14 cefepime >>  Dose adjustments this admission: ---  Microbiology results: 12/4 BCx: ngtd 12/5 MRSA PCR: negative 12/14 ZOX:[id number]:sent    Thank you for allowing pharmacy to be a part of this patient's care.   Adalberto ColeNikola Melani Brisbane, PharmD, BCPS Pager [phone] 04/08/2016 10:07 AM

## 2016-04-08 NOTE — Progress Notes (Signed)
Patient and family agreeable to [facility]

## 2016-04-09 DIAGNOSIS — J690 Pneumonitis due to inhalation of food and vomit: Secondary | ICD-10-CM

## 2016-04-09 LAB — CBC WITH DIFFERENTIAL/PLATELET
Basophils Absolute: 0 10*3/uL (ref 0.0–0.1)
Basophils Relative: 0 %
Eosinophils Absolute: 0.1 10*3/uL (ref 0.0–0.7)
Eosinophils Relative: 1 %
HEMATOCRIT: 28.1 % — AB (ref 36.0–46.0)
Hemoglobin: 9.3 g/dL — ABNORMAL LOW (ref 12.0–15.0)
LYMPHS PCT: 10 %
Lymphs Abs: 1.5 10*3/uL (ref 0.7–4.0)
MCH: 30.5 pg (ref 26.0–34.0)
MCHC: 33.1 g/dL (ref 30.0–36.0)
MCV: 92.1 fL (ref 78.0–100.0)
MONO ABS: 1.1 10*3/uL — AB (ref 0.1–1.0)
MONOS PCT: 7 %
NEUTROS ABS: 12.1 10*3/uL — AB (ref 1.7–7.7)
Neutrophils Relative %: 82 %
Platelets: 461 10*3/uL — ABNORMAL HIGH (ref 150–400)
RBC: 3.05 MIL/uL — ABNORMAL LOW (ref 3.87–5.11)
RDW: 12.8 % (ref 11.5–15.5)
WBC: 14.8 10*3/uL — ABNORMAL HIGH (ref 4.0–10.5)

## 2016-04-09 LAB — BASIC METABOLIC PANEL
ANION GAP: 7 (ref 5–15)
BUN: <5 mg/dL — ABNORMAL LOW (ref 6–20)
CALCIUM: 8.6 mg/dL — AB (ref 8.9–10.3)
CO2: 29 mmol/L (ref 22–32)
Chloride: 97 mmol/L — ABNORMAL LOW (ref 101–111)
Creatinine, Ser: 0.34 mg/dL — ABNORMAL LOW (ref 0.44–1.00)
GFR calc Af Amer: >60 mL/min (ref >60)
GFR calc non Af Amer: >60 mL/min (ref >60)
GLUCOSE: 94 mg/dL (ref 65–99)
Potassium: 3.5 mmol/L (ref 3.5–5.1)
Sodium: 133 mmol/L — ABNORMAL LOW (ref 135–145)

## 2016-04-09 LAB — HIV ANTIBODY (ROUTINE TESTING W REFLEX): HIV Screen 4th Generation wRfx: NONREACTIVE

## 2016-04-09 MED ORDER — TIOTROPIUM BROMIDE MONOHYDRATE 18 MCG IN CAPS
18.0000 ug | ORAL_CAPSULE | Freq: Every day | RESPIRATORY_TRACT | Status: DC
  Administered 2016-04-09 – 2016-04-11 (×3): 18 ug via RESPIRATORY_TRACT
  Filled 2016-04-09: qty 5

## 2016-04-09 MED ORDER — GUAIFENESIN 100 MG/5ML PO SOLN: 15.0000 mL | Freq: Four times a day (QID) | ORAL | Status: DC

## 2016-04-09 MED ORDER — DEXTROSE 5 % IV SOLN
1.0000 g | Freq: Three times a day (TID) | INTRAVENOUS | Status: DC
  Administered 2016-04-09 – 2016-04-10 (×2): 1 g via INTRAVENOUS
  Filled 2016-04-09 (×4): qty 1

## 2016-04-09 MED ORDER — GUAIFENESIN 100 MG/5ML PO SOLN
30.0000 mL | Freq: Four times a day (QID) | ORAL | Status: DC
  Administered 2016-04-10 – 2016-04-12 (×6): 600 mg via ORAL
  Filled 2016-04-09 (×2): qty 30
  Filled 2016-04-09: qty 10
  Filled 2016-04-09: qty 30
  Filled 2016-04-09: qty 10
  Filled 2016-04-09: qty 30
  Filled 2016-04-09: qty 20
  Filled 2016-04-09: qty 30

## 2016-04-09 NOTE — Progress Notes (Signed)
OT Cancellation Note  Patient Details Name: Cheyenne [name] MRN: [id number] DOB: [birth year]   Cancelled Treatment:    Reason Eval/Treat Not Completed: Other (comment).  Spoke to husband at doorway; he did not feel this was a good time.  Will check back another day.  Keira Bohlin 04/09/2016, 12:33 PM  Marica OtterMaryellen Catlin Doria, OTR/L [phone] 04/09/2016

## 2016-04-09 NOTE — Progress Notes (Addendum)
[facility] PROGRESS NOTE    Progress Note  Cheyenne MassedRevauda S Goncalves  ZOX:[id number] DOB: [birth year] DOA: 03/29/2016 PCP: Gaye AlkenBARNES,[name], MD     Brief Narrative:   Cheyenne [name] is an 70 y.o. female  being seen by Dr, [name] for severe COPD, on 3L O2 24/7, former smoker, history of ETOH abuse, chronic pain, comes in with worsening SOB. Patient was last seen by Dr. [name] at the office in October 2017. She was noted to be in mild flare for COPD and was given prednisone taper over one week and levofloxacin for 7 days. She improved and went back to baseline. During that office visit note, she mentioned about having a fall. [facility] pneumothor[facility] and COPD exacerbation s/p chest tube. [facility]  Patient with severe COPD, on 3 L oxygen 24/7, with recent acute exacerbation of COPD likely secondary to bronchitis/PNA, comes in after repeated falls over >pneumothorax on the left lung base on chest x-ray. S/P L CT by [facility] on 12/4. Patient was to be discharged yesterday however complains of shortness of breath and noted to be anxious and a such discharge was canceled. Patient tachycardic with complaints of shortness of breath. CT angiogram pending.  Assessment/Plan:   Acute on chronic hypoxemic respiratory failure with hypercapnia secondary to acute COPD exacerbation/significant pneumothorax on the left secondary to fall - Patient status post left chest tube 03/29/2016 per cardiothoracic surgery (Dr. Tyrone SageGerhardt), chest tube removed 12/11 with improvement on repeat x-rays with no recurrence. - Recurrent pneumothorax left 12/ 6 after being briefly placed on waterseal 12 /5. -04/04/2016 for expansion of left lung on waterseal on 2 L oxygen baseline is 3 L. - Patient was supposed to be discharged now being treated for HCAP right lower lobe pneumonia. Sputum Gram stain and culture. Check a urine Legionella antigen. Check a urine pneumococcus antigen. Check  blood cultures 2. Placed empirically on IV vancomycin and IV cefepime.   Sinus Tachycardia Likely due to right lower lobe airspace disease concerning for pneumonia. Patient has been pancultured. Continue nebulizer treatments. Check a sputum Gram stain and culture. Place empirically on IV vancomycin and IV cefepime. Follow.  Chronic pain and anxiety disorder Continue current pain regimen.  Right humerus fracture Patient's films have been seen by patient's orthopod, Dr. Mack Hookavid Thompson who recommended to continue sarmiento brace, continue range of motion with outpatient follow-up in one month.  Aspiration pneumonia CT angiogram of the chest on 12.14.2017which was done was negative for pulmonary emboli however was consistent with a right lower lobe infiltrate.  - Improved leukocytosis. HIV negaive - Sputum Gram stain and culture negative till date. Urine Legionella antigen pending. Urine pneumococcus antigen negative. Cont empirically on IV vancomycin and IV cefepime.   DVT prophylaxis: lovenox Family Communication:husband Disposition Plan/Barrier to D/C: hopefully in 1-2 days Code Status:     Code Status Orders        Start     Ordered   03/29/16 2305  Do not attempt resuscitation (DNR)  Continuous    Question Answer Comment  In the event of cardiac or respiratory ARREST Do not call a "code blue"   In the event of cardiac or respiratory ARREST Do not perform Intubation, CPR, defibrillation or ACLS   In the event of cardiac or respiratory ARREST Use medication by any route, position, wound care, and other measures to relive pain and suffering. May use oxygen, suction and manual treatment of airway obstruction as needed for comfort.  03/29/16 2304    Code Status History    Date Active Date Inactive Code Status Order ID Comments User Context   03/29/2016  4:09 PM 03/29/2016 11:04 PM DNR [id number]  Louann Sjogren, MD ED    Advance Directive Documentation   Flowsheet  Row Most Recent Value  Type of Advance Directive  Healthcare Power of Attorney, Living will  Pre-existing out of facility DNR order (yellow form or pink MOST form)  No data  "MOST" Form in Place?  No data        IV Access:    Peripheral IV   Procedures and diagnostic studies:   Ct Angio Chest Pe W Or Wo Contrast  Result Date: 04/07/2016 CLINICAL DATA:  Shortness of breath, tachycardia EXAM: CT ANGIOGRAPHY CHEST WITH CONTRAST TECHNIQUE: Multidetector CT imaging of the chest was performed using the standard protocol during bolus administration of intravenous contrast. Multiplanar CT image reconstructions and MIPs were obtained to evaluate the vascular anatomy. CONTRAST:  56.6 mL Isovue 370 COMPARISON:  03/29/2016, 05/12/2012 FINDINGS: Cardiovascular: Satisfactory opacification of the pulmonary arteries to the segmental level. No evidence of pulmonary embolism. Normal heart size. No pericardial effusion. Thoracic aortic atherosclerosis. Coronary artery atherosclerosis of the LAD, ramus intermedius and RCA. Mediastinum/Nodes: No enlarged mediastinal, hilar, or axillary lymph nodes. Thyroid gland, trachea, and esophagus demonstrate no significant findings. Right lower lobe airspace disease. Lungs/Pleura: No pneumothorax. Severe bilateral emphysematous changes most severe in the left lower lobe. No pleural effusion. Right lower lobe airspace disease. 7 mm right lower lobe pulmonary nodule unchanged compared with 05/12/2012. Upper Abdomen: No acute abnormality. Musculoskeletal: No acute osseous abnormality. Chronic T7 and T8 vertebral body compression fractures. Degenerative disc disease throughout the thoracic spine. Small amount of soft tissue emphysema in the left lateral chest wall. Review of the MIP images confirms the above findings. IMPRESSION: 1. No evidence of pulmonary embolus. 2. Right lower lobe airspace disease concerning for pneumonia. 3.  Aortic Atherosclerosis (ICD10-170.0) 4.   Emphysema. ([id number]) Electronically Signed   By: Elige Ko   On: 04/07/2016 18:16     Medical Consultants:    None.  Anti-Infectives:   IV vanc and cefepime  Subjective:    Alex S Bohlman she relates she is coughing pain is controlled.  Objective:    Vitals:   04/08/16 2043 04/08/16 2155 04/09/16 0412 04/09/16 0738  BP:  (!) 157/74 (!) 151/69   Pulse:  93 81   Resp:  19 18   Temp:  98.9 F (37.2 C) 98.2 F (36.8 C)   TempSrc:  Oral Oral   SpO2: 98% 100% 100% 98%  Weight:      Height:        Intake/Output Summary (Last 24 hours) at 04/09/16 1156 Last data filed at 04/08/16 2036  Gross per 24 hour  Intake           1907.5 ml  Output                0 ml  Net           1907.5 ml   Filed Weights   03/29/16 2311 04/03/16 1500  Weight: 45.4 kg (100 lb 1.4 oz) 47.1 kg (103 lb 13.4 oz)    Exam: General exam: In no acute distress. Respiratory system: Good air movement and clear to auscultation. Cardiovascular system: S1 & S2 heard, RRR. No JVD. Gastrointestinal system: Abdomen is nondistended, soft and nontender.  Extremities: No pedal edema. Skin: No rashes,  lesions or ulcers Psychiatry: Judgement and insight appear normal. Mood & affect appropriate.    Data Reviewed:    Labs: Basic Metabolic Panel:  Recent Labs Lab 04/03/16 0343 04/04/16 0541 04/05/16 0656 04/06/16 0526 04/08/16 0548 04/09/16 0542  NA 135  --  135 136 133* 133*  K 3.2*  --  2.7* 4.8 4.4 3.5  CL 92*  --  89* 95* 95* 97*  CO2 34*  --  37* 33* 30 29  GLUCOSE 105*  --  104* 101* 110* 94  BUN 9  --  6 8 7  <5*  CREATININE 0.39*  --  0.50 0.41* 0.40* 0.34*  CALCIUM 8.8*  --  9.0 9.3 9.3 8.6*  MG  --  1.8 1.8 2.2  --   --   PHOS  --  3.6 3.5 3.5  --   --    GFR Estimated Creatinine Clearance: 49.3 mL/min (by C-G formula based on SCr of 0.34 mg/dL (L)). Liver Function Tests: No results for input(s): AST, ALT, ALKPHOS, BILITOT, PROT, ALBUMIN in the last 168 hours. No results  for input(s): LIPASE, AMYLASE in the last 168 hours. No results for input(s): AMMONIA in the last 168 hours. Coagulation profile No results for input(s): INR, PROTIME in the last 168 hours.  CBC:  Recent Labs Lab 04/03/16 0343 04/05/16 0536 04/06/16 0526 04/08/16 0548 04/09/16 0542  WBC 13.7* 14.9* 14.5* 17.3* 14.8*  NEUTROABS  --   --   --  14.0* 12.1*  HGB 10.1* 10.3* 11.2* 10.5* 9.3*  HCT 31.7* 31.3* 34.0* 32.8* 28.1*  MCV 94.1 92.3 91.2 92.9 92.1  PLT 320 380 447* 494* 461*   Cardiac Enzymes: No results for input(s): CKTOTAL, CKMB, CKMBINDEX, TROPONINI in the last 168 hours. BNP (last 3 results) No results for input(s): PROBNP in the last 8760 hours. CBG: No results for input(s): GLUCAP in the last 168 hours. D-Dimer: No results for input(s): DDIMER in the last 72 hours. Hgb A1c: No results for input(s): HGBA1C in the last 72 hours. Lipid Profile: No results for input(s): CHOL, HDL, LDLCALC, TRIG, CHOLHDL, LDLDIRECT in the last 72 hours. Thyroid function studies: No results for input(s): TSH, T4TOTAL, T3FREE, THYROIDAB in the last 72 hours.  Invalid input(s): FREET3 Anemia work up: No results for input(s): VITAMINB12, FOLATE, FERRITIN, TIBC, IRON, RETICCTPCT in the last 72 hours. Sepsis Labs:  Recent Labs Lab 04/03/16 0343 04/05/16 0536 04/06/16 0526 04/08/16 0548 04/09/16 0542  PROCALCITON <0.10  --   --   --   --   WBC 13.7* 14.9* 14.5* 17.3* 14.8*   Microbiology No results found for this or any previous visit (from the past 240 hour(s)).   Medications:   . budesonide (PULMICORT) nebulizer solution  0.5 mg Nebulization BID  . buPROPion  150 mg Oral Daily  . ceFEPime (MAXIPIME) IV  1 g Intravenous Q8H  . escitalopram  10 mg Oral Daily  . feeding supplement  1 Container Oral TID BM  . guaiFENesin  1,200 mg Oral BID  . heparin subcutaneous  5,000 Units Subcutaneous Q8H  . ipratropium  0.5 mg Nebulization Q6H  . levalbuterol  0.63 mg Nebulization Q6H   . levothyroxine  50 mcg Oral Daily  . mouth rinse  15 mL Mouth Rinse BID  . multivitamin with minerals  1 tablet Oral Daily  . sodium chloride flush  3 mL Intravenous Q12H  . vancomycin  1,000 mg Intravenous Q24H   Continuous Infusions:  Time spent: 25 min  LOS: 11 days   Marinda Elk  [facility] Pager [phone]  *Please refer to [email or web address], password [email or web address] to get updated schedule on who will round on this patient, as hospitalists switch teams weekly. If 7PM-7AM, please contact night-coverage at [email or web address], password [email or web address] for any overnight needs.  04/09/2016, 11:56 AM

## 2016-04-09 NOTE — Consult Note (Signed)
   [facility]   04/09/2016  [name] 1945/06/16 [id number]    [facility]. Chart reviewed. Noted current discharge plan is for SNF .  There are no identifiable [facility] needs at this time. If patient's post hospital needs change, please place a [facility] consult. For questions please contact:  Raiford Nobletika Cedrik Heindl, MSN-Ed, RN,BSN [facility] Hospital Liaison [phone]

## 2016-04-09 NOTE — Evaluation (Signed)
Clinical/Bedside Swallow Evaluation Patient Details  Name: Cheyenne [name] MRN: [id number] Date of Birth: 05-16-1945  Today's Date: 04/09/2016 Time: SLP Start Time (ACUTE ONLY): 1350 SLP Stop Time (ACUTE ONLY): 1412 SLP Time Calculation (min) (ACUTE ONLY): 22 min  Past Medical History:  Past Medical History:  Diagnosis Date  . Alcohol abuse   . Anxiety   . Asthma   . COPD (chronic obstructive pulmonary disease) (HCC)   . Depression   . DJD (degenerative joint disease)   . Hypothyroidism   . Osteopenia   . Psoriatic arthritis (HCC) 06/02/2009  . Vitamin B12 deficiency    Past Surgical History:  Past Surgical History:  Procedure Laterality Date  . CATARACT EXTRACTION, BILATERAL    . SPINE SURGERY  05/25/10   lesion removed from t11- Dr. Danielle DessElsner  . VITRECTOMY     right   HPI:  Pt is a 70 year old female with acute on chronic hypoxemic respiratory failure with hypercapnia secondary to acuteCOPD exacerbation/significant pneumothorax on the left secondary to fall. CT angiogram of the chest on 12.14.2017which was done was negative for pulmonary emboli however showed Severe bilateral emphysematous changes most severe in the left lower lobe. No pleural effusion. Right lower lobe airspace disease. MD concerned for aspiration pna.   Assessment / Plan / Recommendation Clinical Impression  Pt demosntrates signs concerning for a primary esohageal dysphagia that do put pt at risk of post prandial aspiration. Pt reports limiting PO intake to just a few bites/sips per meal due to feeling chest pain. SHe also reports recent weight loss. When observed tih thin liquids exclusively pt demosntrated no signs of aspiration, but when given a few bites of a cookie followed by a sip of liquid pt demosntrated delayed congested coughing and reported "I felt like I was choking."  Discussed signs of esophageal dysphagia with pt and her husband. Pt was agreeable, but tangential and anxious. Recommend esophageal  precautions such as getting up to chair for meals and staying upright for 30-60 minutes after eating, following solids with liquids and considering soft, moist solids only. May need pills crushed. Also suggest esophageal assessment, though pt may not be able to tolerate this procedure. Will f/u to reinforce precautions.     Aspiration Risk  Moderate aspiration risk    Diet Recommendation Dysphagia 2 (Fine chop);Thin liquid   Liquid Administration via: Cup;Straw Medication Administration: Crushed with puree Supervision: Patient able to self feed Compensations: Slow rate;Small sips/bites Postural Changes: Seated upright at 90 degrees (up to chair for meals)    Other  Recommendations Recommended Consults: Consider GI evaluation;Consider esophageal assessment   Follow up Recommendations 24 hour supervision/assistance      Frequency and Duration min 2x/week  2 weeks       Prognosis Prognosis for Safe Diet Advancement: Fair      Swallow Study   General HPI: Pt is a 70 year old female with acute on chronic hypoxemic respiratory failure with hypercapnia secondary to acuteCOPD exacerbation/significant pneumothorax on the left secondary to fall. CT angiogram of the chest on 12.14.2017which was done was negative for pulmonary emboli however showed Severe bilateral emphysematous changes most severe in the left lower lobe. No pleural effusion. Right lower lobe airspace disease. MD concerned for aspiration pna. Type of Study: Bedside Swallow Evaluation Previous Swallow Assessment: none Diet Prior to this Study: Regular;Thin liquids Temperature Spikes Noted: No Respiratory Status: Nasal cannula History of Recent Intubation: No Behavior/Cognition: Alert;Cooperative;Confused;Requires cueing;Distractible Oral Cavity Assessment: Within Functional Limits Oral Care  Completed by SLP: No Oral Cavity - Dentition: Adequate natural dentition Vision: Functional for self-feeding Self-Feeding Abilities:  Able to feed self Patient Positioning: Upright in bed Baseline Vocal Quality: Normal Volitional Cough: Congested Volitional Swallow: Able to elicit    Oral/Motor/Sensory Function Overall Oral Motor/Sensory Function: Within functional limits   Ice Chips     Thin Liquid Thin Liquid: Impaired Presentation: Cup;Straw;Self Fed Oral Phase Functional Implications: Oral holding    Nectar Thick Nectar Thick Liquid: Not tested   Honey Thick Honey Thick Liquid: Not tested   Puree Puree: Not tested   Solid   GO   Solid: Impaired Presentation: Self Fed Pharyngeal Phase Impairments: Cough - Delayed       Harlon DittyBonnie Andrzej Scully, MA CCC-SLP [phone]  Osbaldo Mark, Riley NearingBonnie Caroline 04/09/2016,2:24 PM

## 2016-04-09 NOTE — Progress Notes (Signed)
Nutrition Follow-up  DOCUMENTATION CODES:   Non-severe (moderate) malnutrition in context of chronic illness  INTERVENTION:  Continue Boost Breeze po TID, each supplement provides 250 kcal and 9 grams of protein.  Continue multivitamin with minerals daily.  Will monitor for discussions regarding goals of care and adjust nutrition intervention accordingly.  NUTRITION DIAGNOSIS:   Increased nutrient needs related to chronic illness (COPD) as evidenced by estimated needs (for protein).  Ongoing.  GOAL:   Patient will meet greater than or equal to 90% of their needs  Not met.  MONITOR:   PO intake, Supplement acceptance, Weight trends, Labs, I & O's  REASON FOR ASSESSMENT:   Consult Assessment of nutrition requirement/status  ASSESSMENT:    70 y.o. female with medical history significant of COPD  chronic hypoxemic respiratory failure on home oxygen 3 L, former smoker, former EtOH abuse, hx hypothroidism, DJD, anxiety, recurrent falls. Pt admitted after fall at home.  Spoke with patient and family at bedside. They were very tearful. Report patient's appetite is very poor. She is not experiencing N/V or abdominal pain anymore, but doesn't feel like eating. She is tolerating sips of Boost Breeze and Diet Coke.   Meal Completion: 10% most recently per chart. Patient was previously completing 75-100% of meals earlier this week.  Medications reviewed and include: levothyroxine, multivitamin with minerals daily, vancomycin.  Labs reviewed: Potassium 3.5 (low end of normal), Chloride 97, BUN <5, Creatinine 0.34.   Discussed with RN. Patient and family are considering hospice/comfort care. Per chart patient is DNR at this time with plan for palliative follow-up at SNF.  Diet Order:  Diet regular Room service appropriate? Yes; Fluid consistency: Thin  Skin:  Reviewed, no issues  Last BM:  04/07/2016  Height:   Ht Readings from Last 1 Encounters:  04/03/16 _0  (1.549 m)     Weight:   Wt Readings from Last 1 Encounters:  04/03/16 103 lb 13.4 oz (47.1 kg)    Ideal Body Weight:  45.45 kg  BMI:  Body mass index is 19.62 kg/m.  Estimated Nutritional Needs:   Kcal:  1135-1360 (25-30 kcal/kg)  Protein:  60-70 grams  Fluid:  >/= 1.3 L/day  EDUCATION NEEDS:   No education needs identified at this time  Willey Blade, MS, RD, LDN Pager: [phone] After Hours Pager: [phone]

## 2016-04-09 NOTE — Progress Notes (Signed)
[facility] updated facility, plan is for patient to discharge to [facility] with palliative services. [facility] will continue to follow and  assist  with patient disposition.

## 2016-04-09 NOTE — Progress Notes (Signed)
Spiritual care following.  Provided emotional support with pt at bedside.

## 2016-04-10 LAB — LEGIONELLA PNEUMOPHILA SEROGP 1 UR AG: L. PNEUMOPHILA SEROGP 1 UR AG: NEGATIVE

## 2016-04-10 MED ORDER — CLINDAMYCIN HCL 300 MG PO CAPS
300.0000 mg | ORAL_CAPSULE | Freq: Three times a day (TID) | ORAL | Status: DC
  Administered 2016-04-10 – 2016-04-11 (×3): 300 mg via ORAL
  Filled 2016-04-10 (×3): qty 1

## 2016-04-10 NOTE — Progress Notes (Signed)
[facility] PROGRESS NOTE    Progress Note  Cheyenne [name]  EAV:[id number] DOB: 1945/07/27 DOA: 03/29/2016 PCP: Cheyenne [name]     Brief Narrative:   Cheyenne [name] is an 70 y.o. female  being seen by Cheyenne [name] for severe COPD, on 3L O2 24/7, former smoker, history of ETOH abuse, chronic pain, comes in with worsening SOB. Patient was last seen by Dr. Delton [name] at the office in October 2017. She was noted to be in mild flare for COPD and was given prednisone taper over one week and levofloxacin for 7 days. She improved and went back to baseline. During that office visit note, she mentioned about having a fall. [facility]oth[facility] and COPD exacerbation s/p chest tube. [facility]  Patient with severe COPD, on 3 L oxygen 24/7, with recent acute exacerbation of COPD likely secondary to bronchitis/PNA, comes in after repeated falls over >pneumothorax on the left lung base on chest x-ray. S/P L CT by [facility] on 12/4. Patient was to be discharged yesterday however complains of shortness of breath and noted to be anxious and a such discharge was canceled. Patient tachycardic with complaints of shortness of breath. CT angiogram pending.  Assessment/Plan:   Acute on chronic hypoxemic respiratory failure with hypercapnia secondary to acute COPD exacerbation/significant pneumothorax on the left secondary to fall: - Patient status post left chest tube 03/29/2016 per cardiothoracic surgery (Cheyenne [name]), chest tube removed 12/11 with improvement on repeat x-rays with no recurrence. - Recurrent pneumothorax left 12/ 6 after being briefly placed on waterseal 12 /5. -04/04/2016 for expansion of left lung on waterseal on 2 L oxygen baseline is 3 L. - Patient was supposed to be discharged now being treated for HCAP right lower lobe pneumonia. Sputum Gram stain and culture. Check a urine Legionella antigen. Check a urine pneumococcus antigen. blood  cultures 2 negative. - Placed empirically on IV vancomycin and IV cefepime, transition to oral clindamycin on 12.16.2017. - monitor for fever over night.  Sinus Tachycardia Resolved.  Chronic pain and anxiety disorder Continue current pain regimen.  Right humerus fracture Patient's films have been seen by patient's orthopod, Cheyenne [name] who recommended to continue sarmiento brace, continue range of motion with outpatient follow-up in one month.  Aspiration pneumonia CT angiogram of the chest on 12.14.2017which was done was negative for pulmonary emboli however was consistent with a right lower lobe infiltrate.  - Sputum Gram stain and culture negative till date. Urine Legionella antigen pending. Urine pneumococcus antigen negative. Cont empirically on IV vancomycin and IV cefepime. - Swallow evaluation done that showed moderate to high risk for aspiration her diet was changed to a dysphagia 2 diet. We'll transition her to oral clindamycin, monitor fever curve over night.  DVT prophylaxis: lovenox Family Communication:husband Disposition Plan/Barrier to D/C: hopefully in am Code Status:     Code Status Orders        Start     Ordered   03/29/16 2305  Do not attempt resuscitation (DNR)  Continuous    Question Answer Comment  In the event of cardiac or respiratory ARREST Do not call a "code blue"   In the event of cardiac or respiratory ARREST Do not perform Intubation, CPR, defibrillation or ACLS   In the event of cardiac or respiratory ARREST Use medication by any route, position, wound care, and other measures to relive pain and suffering. May use oxygen, suction and manual treatment of airway obstruction  as needed for comfort.      03/29/16 2304    Code Status History    Date Active Date Inactive Code Status Order ID Comments User Context   03/29/2016  4:09 PM 03/29/2016 11:04 PM DNR [id number]  Cheyenne [name] ED    Advance Directive Documentation     Flowsheet Row Most Recent Value  Type of Advance Directive  Healthcare Power of Attorney, Living will  Pre-existing out of facility DNR order (yellow form or pink MOST form)  No data  "MOST" Form in Place?  No data        IV Access:    Peripheral IV   Procedures and diagnostic studies:   No results found.   Medical Consultants:    None.  Anti-Infectives:   IV vanc and cefepime  Subjective:    Cheyenne [name] she relates her breathing is better.  Objective:    Vitals:   04/10/16 0630 04/10/16 0700 04/10/16 0804 04/10/16 0805  BP: 119/78     Pulse: 98     Resp: 20     Temp: 98.7 F (37.1 C)     TempSrc: Oral     SpO2: 100% 100% 100% 100%  Weight:      Height:        Intake/Output Summary (Last 24 hours) at 04/10/16 0937 Last data filed at 04/10/16 0656  Gross per 24 hour  Intake              540 ml  Output                0 ml  Net              540 ml   Filed Weights   03/29/16 2311 04/03/16 1500  Weight: 45.4 kg (100 lb 1.4 oz) 47.1 kg (103 lb 13.4 oz)    Exam: General exam: In no acute distress. Respiratory system: Good air movement and clear to auscultation. Cardiovascular system: S1 & S2 heard, RRR. No JVD. Gastrointestinal system: Abdomen is nondistended, soft and nontender.  Extremities: No pedal edema. Skin: No rashes, lesions or ulcers Psychiatry: Judgement and insight appear normal. Mood & affect appropriate.    Data Reviewed:    Labs: Basic Metabolic Panel:  Recent Labs Lab 04/04/16 0541  04/05/16 0656 04/06/16 0526 04/08/16 0548 04/09/16 0542  NA  --   --  135 136 133* 133*  K  --   < > 2.7* 4.8 4.4 3.5  CL  --   --  89* 95* 95* 97*  CO2  --   --  37* 33* 30 29  GLUCOSE  --   --  104* 101* 110* 94  BUN  --   --  6 8 7  <5*  CREATININE  --   --  0.50 0.41* 0.40* 0.34*  CALCIUM  --   --  9.0 9.3 9.3 8.6*  MG 1.8  --  1.8 2.2  --   --   PHOS 3.6  --  3.5 3.5  --   --   < > = values in this interval not  displayed. GFR Estimated Creatinine Clearance: 49.3 mL/min (by C-G formula based on SCr of 0.34 mg/dL (L)). Liver Function Tests: No results for input(s): AST, ALT, ALKPHOS, BILITOT, PROT, ALBUMIN in the last 168 hours. No results for input(s): LIPASE, AMYLASE in the last 168 hours. No results for input(s): AMMONIA in the last 168 hours. Coagulation profile No results  for input(s): INR, PROTIME in the last 168 hours.  CBC:  Recent Labs Lab 04/05/16 0536 04/06/16 0526 04/08/16 0548 04/09/16 0542  WBC 14.9* 14.5* 17.3* 14.8*  NEUTROABS  --   --  14.0* 12.1*  HGB 10.3* 11.2* 10.5* 9.3*  HCT 31.3* 34.0* 32.8* 28.1*  MCV 92.3 91.2 92.9 92.1  PLT 380 447* 494* 461*   Cardiac Enzymes: No results for input(s): CKTOTAL, CKMB, CKMBINDEX, TROPONINI in the last 168 hours. BNP (last 3 results) No results for input(s): PROBNP in the last 8760 hours. CBG: No results for input(s): GLUCAP in the last 168 hours. D-Dimer: No results for input(s): DDIMER in the last 72 hours. Hgb A1c: No results for input(s): HGBA1C in the last 72 hours. Lipid Profile: No results for input(s): CHOL, HDL, LDLCALC, TRIG, CHOLHDL, LDLDIRECT in the last 72 hours. Thyroid function studies: No results for input(s): TSH, T4TOTAL, T3FREE, THYROIDAB in the last 72 hours.  Invalid input(s): FREET3 Anemia work up: No results for input(s): VITAMINB12, FOLATE, FERRITIN, TIBC, IRON, RETICCTPCT in the last 72 hours. Sepsis Labs:  Recent Labs Lab 04/05/16 0536 04/06/16 0526 04/08/16 0548 04/09/16 0542  WBC 14.9* 14.5* 17.3* 14.8*   Microbiology Recent Results (from the past 240 hour(s))  Culture, blood (routine x 2) Call [name] if unable to obtain prior to antibiotics being given     Status: None (Preliminary result)   Collection Time: 04/08/16  9:23 AM  Result Value Ref Range Status   Specimen Description BLOOD RIGHT ARM  Final   Special Requests IN PEDIATRIC BOTTLE 3CC  Final   Culture   Final    NO GROWTH 1  DAY Performed at [facility]    Report Status PENDING  Incomplete  Culture, blood (routine x 2) Call [name] if unable to obtain prior to antibiotics being given     Status: None (Preliminary result)   Collection Time: 04/08/16  9:29 AM  Result Value Ref Range Status   Specimen Description BLOOD RIGHT ARM  Final   Special Requests BOTTLES DRAWN AEROBIC ONLY 5CC  Final   Culture   Final    NO GROWTH 1 DAY Performed at [facility]    Report Status PENDING  Incomplete     Medications:   . budesonide (PULMICORT) nebulizer solution  0.5 mg Nebulization BID  . buPROPion  150 mg Oral Daily  . ceFEPime (MAXIPIME) IV  1 g Intravenous Q8H  . escitalopram  10 mg Oral Daily  . feeding supplement  1 Container Oral TID BM  . guaiFENesin  30 mL Oral Q6H  . heparin subcutaneous  5,000 Units Subcutaneous Q8H  . ipratropium  0.5 mg Nebulization Q6H  . levalbuterol  0.63 mg Nebulization Q6H  . levothyroxine  50 mcg Oral Daily  . mouth rinse  15 mL Mouth Rinse BID  . multivitamin with minerals  1 tablet Oral Daily  . sodium chloride flush  3 mL Intravenous Q12H  . tiotropium  18 mcg Inhalation Daily  . vancomycin  1,000 mg Intravenous Q24H   Continuous Infusions:  Time spent: 25 min   LOS: 12 days   Marinda Elk  [facility] Pager [phone]  *Please refer to [email or web address], password [email or web address] to get updated schedule on who will round on this patient, as hospitalists switch teams weekly. If 7PM-7AM, please contact night-coverage at [email or web address], password [email or web address] for any overnight needs.  04/10/2016, 9:37 AM

## 2016-04-11 LAB — CBC WITH DIFFERENTIAL/PLATELET
BASOS ABS: 0 10*3/uL (ref 0.0–0.1)
Basophils Relative: 0 %
EOS PCT: 0 %
Eosinophils Absolute: 0 10*3/uL (ref 0.0–0.7)
HEMATOCRIT: 30 % — AB (ref 36.0–46.0)
Hemoglobin: 9.9 g/dL — ABNORMAL LOW (ref 12.0–15.0)
LYMPHS ABS: 1.5 10*3/uL (ref 0.7–4.0)
LYMPHS PCT: 13 %
MCH: 29.9 pg (ref 26.0–34.0)
MCHC: 33 g/dL (ref 30.0–36.0)
MCV: 90.6 fL (ref 78.0–100.0)
MONO ABS: 1 10*3/uL (ref 0.1–1.0)
Monocytes Relative: 8 %
NEUTROS ABS: 9.3 10*3/uL — AB (ref 1.7–7.7)
Neutrophils Relative %: 79 %
PLATELETS: 584 10*3/uL — AB (ref 150–400)
RBC: 3.31 MIL/uL — ABNORMAL LOW (ref 3.87–5.11)
RDW: 12.7 % (ref 11.5–15.5)
WBC: 11.9 10*3/uL — ABNORMAL HIGH (ref 4.0–10.5)

## 2016-04-11 LAB — BASIC METABOLIC PANEL WITH GFR
Anion gap: 8 (ref 5–15)
BUN: <5 mg/dL — ABNORMAL LOW (ref 6–20)
CO2: 35 mmol/L — ABNORMAL HIGH (ref 22–32)
Calcium: 9 mg/dL (ref 8.9–10.3)
Chloride: 92 mmol/L — ABNORMAL LOW (ref 101–111)
Creatinine, Ser: 0.36 mg/dL — ABNORMAL LOW (ref 0.44–1.00)
GFR calc Af Amer: >60 mL/min (ref >60)
GFR calc non Af Amer: >60 mL/min (ref >60)
Glucose, Bld: 113 mg/dL — ABNORMAL HIGH (ref 65–99)
Potassium: 3.1 mmol/L — ABNORMAL LOW (ref 3.5–5.1)
Sodium: 135 mmol/L (ref 135–145)

## 2016-04-11 LAB — MAGNESIUM: Magnesium: 1.7 mg/dL (ref 1.7–2.4)

## 2016-04-11 MED ORDER — POTASSIUM CHLORIDE CRYS ER 20 MEQ PO TBCR
40.0000 meq | EXTENDED_RELEASE_TABLET | ORAL | Status: AC
  Administered 2016-04-11 (×2): 40 meq via ORAL
  Filled 2016-04-11 (×2): qty 2

## 2016-04-11 MED ORDER — LEVOFLOXACIN 750 MG PO TABS
750.0000 mg | ORAL_TABLET | Freq: Every day | ORAL | Status: DC
  Administered 2016-04-11 – 2016-04-12 (×2): 750 mg via ORAL
  Filled 2016-04-11 (×2): qty 1

## 2016-04-11 MED ORDER — MAGNESIUM SULFATE 4 GM/100ML IV SOLN
4.0000 g | Freq: Once | INTRAVENOUS | Status: AC
  Administered 2016-04-11: 4 g via INTRAVENOUS
  Filled 2016-04-11: qty 100

## 2016-04-11 NOTE — Progress Notes (Signed)
Physical Therapy Treatment Patient Details Name: Cheyenne MassedRevauda S [name] MRN: [id number] DOB: 07/17/1945 Today's Date: 04/11/2016    History of Present Illness Patient is a 70 year old female past medical history of alcohol abuse, hypothyroidism and chronic respiratory failure with hypoxia secondary to COPD on 3 L nasal cannula at home who for the past month has had several major falls at home which led to R humeral fracture treated with splinting.Patient presented to the emergency room on 12/4 with worsening cough, shortness of breath , pneumothorax.ygenation. Cardiothoracic surgery consulted who placed a chest tube. . Patient admitted to  for COPD exacerbation with possible healthcare associated pneumonia worsened from pneumothorax.    PT Comments    [facility] then to recliner only due to weakness/fatigue and HIGH anxiety.  Remained on 3 lts sats 100%.  Pt on O2 prior but unable to state how many liters. MD came into room and reduced to 1 lt with sats avg 94%.  Pt was unable to amb due to weakness/fatigue and fear.  Positioned in recliner with daughter in room.   Follow Up Recommendations  SNF     Equipment Recommendations       Recommendations for Other Services       Precautions / Restrictions Precautions Precautions: Fall Precaution Comments: R humeral fx, monitor sats and HR Required Braces or Orthoses: Other Brace/Splint Other Brace/Splint: humeral splint Restrictions Weight Bearing Restrictions: No RUE Weight Bearing: Weight bearing as tolerated Other Position/Activity Restrictions: RUE    Mobility  Bed Mobility Overal bed mobility: Needs Assistance Bed Mobility: Supine to Sit     Supine to sit: Mod assist;Max assist     General bed mobility comments: assist for trunk and use of bed pad to complete scooting to EOB  Transfers Overall transfer level: Needs assistance Equipment used: 2 person hand held assist Transfers: Sit to/from [employer]Stand;Stand Pivot  Transfers Sit to Stand: Mod assist;Max assist Stand pivot transfers: Mod assist;Max assist       General transfer comment[facility] hand held assist 1/4 turn.  High anxiety/fear of falling.  Required increased time and MAX VC's.  Remained on 3 lts sats 100%.  HR 126  Ambulation/Gait             General Gait Details: unable to attempt due to pt's anxiety and weakness during transfer.  Pt c/o dizziness.  [facility].    Stairs            Wheelchair Mobility    Modified Rankin (Stroke Patients Only)       Balance                                    Cognition Arousal/Alertness: Awake/alert Behavior During Therapy: WFL for tasks assessed/performed Overall Cognitive Status: Within Functional Limits for tasks assessed                      Exercises      General Comments        Pertinent Vitals/Pain Pain Assessment: Faces Faces Pain Scale: Hurts little more Pain Location: R arm Pain Descriptors / Indicators: Discomfort;Grimacing Pain Intervention(s): Monitored during session;Repositioned    Home Living                      Prior Function  PT Goals (current goals can now be found in the care plan section) Progress towards PT goals: Progressing toward goals    Frequency    Min 3X/week      PT Plan Current plan remains appropriate    Co-evaluation             End of Session Equipment Utilized During Treatment: Oxygen;Gait belt Activity Tolerance: Patient limited by fatigue;Other (comment) (anxiety) Patient left: in chair;with call bell/phone within reach;with family/visitor present     Time: [id number] PT Time Calculation (min) (ACUTE ONLY): 28 min  Charges:  $Therapeutic Activity: 23-37 mins                    G Codes:      Felecia ShellingLori Latrice Storlie  PTA [facility]  Acute  Rehab Pager      [phone]

## 2016-04-11 NOTE — Progress Notes (Signed)
PROGRESS NOTE    Cheyenne [name]  FAO:[id number] DOB: 1945/07/30 DOA: 03/29/2016 PCP: Gaye AlkenBARNES,[name], MD    Brief Narrative:  Patient is a 70 year old female, being seen by Dr, [name] for severe COPD, on 3L O2 24/7, former smoker, history of ETOH abuse, chronic pain, comes in with worsening SOB. Patient was last seen by Dr. [name] at the office in October 2017. She was noted to be in mild flare for COPD and was given prednisone taper over one week and levofloxacin for 7 days. She improved and went back to baseline. During that office visit note, she mentioned about having a fall.  [facility] and COPD exacerbation s/p chest tube.  [facility]   Patient with severe COPD, on 3 L oxygen 24/7, with recent acute exacerbation of COPD likely secondary to bronchitis/PNA, comes in after repeated falls over >pneumothorax on the left lung base on chest x-ray. S/P L CT by [facility] on 12/4. Patient was to be discharged yesterday however complains of shortness of breath and noted to be anxious and a such discharge was canceled. Patient tachycardic with complaints of shortness of breath. CT angiogram negative for PE however consistent with an pneumonia. Patient placed empirically on IV vancomycin and IV cefepime with continued improvement. Patient subsequently transitioned to oral Levaquin on 04/11/2016.   Assessment & Plan:   Principal Problem:   Respiratory failure with hypercapnia (HCC) Active Problems:   COPD exacerbation (HCC)   Pneumothorax   Shortness of breath   Tachycardia   Hypokalemia   Chest tube in place   Pneumothorax on left   Malnutrition of moderate degree   Right arm fracture   SOB (shortness of breath)   HCAP (healthcare-associated pneumonia)  #1 acute on chronic hypoxemic respiratory failure with hypercapnia secondary to acute COPD exacerbation/significant pneumothorax on the left secondary to fall Patient status post left  chest tube 03/29/2016 per cardiothoracic surgery (Dr. Tyrone SageGerhardt), chest tube removed 12/11 with improvement on repeat x-rays with no recurrence. Recurrent pneumothorax left 12/ 6 after being briefly placed on waterseal 12 /5. 04/04/2016 for expansion of left lung on waterseal on 2 L oxygen baseline is 3 L. Patient was seen by critical care medicine and placed on Pulmicort nebs twice daily, duo nebs 4 times a day with goal to keep sats greater than 88%. Chest tube removed, repeat films good and patient given clearance by pulmonary for discharge with outpatient follow-up with Dr. [name] in 2 weeks. Patient was supposed to be discharged however complains of shortness of breath and noted to be tachycardic.Patient now with HCAP. Per Family patient with significant immobility prior to admission. CT angiogram chest negative for pulmonary emboli however consistent with right lower lobe pneumonia. Sputum Gram stain and culture negative. Urine Legionella antigen and urine pneumococcus antigen was negative. Blood cultures with no growth to date. Patient was initially placed empirically on IV vancomycin and IV cefepime and will be transitioned to oral Levaquin. If continued improvement could be discharged on oral Levaquin to complete a course of antibiotic treatment.  #2 tachycardia Patient noted to be tachycardic however patient with anxiety disorder. Likely secondary to pneumonia noted on CT angiogram chest. EKG consistent with a sinus tachycardia. CT angiogram chest negative for pulmonary emboli and consistent with right lower lobe airspace disease concerning for pneumonia. Tachycardia improved. Patient has been pancultured. Continue nebulizer treatments. IV antibiotics will be narrowed to oral Levaquin.   #3 chronic pain and anxiety disorder Continue  current pain regimen.  #4 right humerus fracture Patient's films have been seen by patient's orthopod, [name] who recommended to continue sarmiento  brace, continue range of motion with outpatient follow-up in one month.  #5 healthcare associated pneumonia Over the past 1-2 days patient had complaints of worsening shortness of breath and some chest pain noted to be tachycardic. CT angiogram of the chest which was done was negative for pulmonary emboli however was consistent with a right lower lobe infiltrate. Patient with a worsening leukocytosis with a white count of 17.3. Patient afebrile. Sputum Gram stain and culture negative. Urine Legionella antigen and urine pneumococcus antigen negative. Blood cultures with no growth to date. Patient IV vancomycin IV cefepime were discontinued and patient started on oral clindamycin today 04/11/2016. Will discontinue oral clindamycin and place patient on oral Levaquin.  #6 hypokalemia/hypomagnesemia Replete.    DVT prophylaxis: Heparin Code Status: DO NOT RESUSCITATE Family Communication: Updated patient and best friend at bedside. Disposition Plan:  Skilled nursing facility with palliative care following, once on oral antibiotics with clinical improvement hopefully tomorrow.   Consultants:   PCCM Dr. Lucy Chris 03/29/2016  Palliative care [name] 1 04/07/2016  CVTS: SDr [name]  Procedures:   CT chest without contrast 03/29/2016  CT C-spine 03/29/2016  CT head 03/29/2016  Chest x-ray 04/06/2016, 04/05/2016, 04/04/2016, 04/03/2016, 04/02/2016, 04/01/2016, 03/31/2016, 03/30/2016, 03/29/2016  Plain films of the right knee 03/29/2016  Plain films of the right humerus 04/05/2016  Chest tube placement per Dr [name]  CT angio chest 04/07/2016.  Antimicrobials:   IV Unasyn 03/31/2016>>>> 04/02/2016  IV aztreonam 03/29/2016>>>> 03/31/2016  IV vancomycin 03/29/2016>>>> 03/31/2016  IV vancomycin 04/08/2016>>>>> 04/10/2016  IV cefepime 04/08/2016>>>. 04/10/2016  Oral clindamycin 04/11/2016 1  Oral Levaquin 04/11/2016   Subjective: Patient sitting up in bed eating breakfast.  Patient states she's feeling some better with improved shortness of breath however still fatigued. Anxiety has improved. Patient denies any chest pain.   Objective: Vitals:   04/11/16 0322 04/11/16 0419 04/11/16 0808 04/11/16 0809  BP:  (!) 145/72    Pulse:  85    Resp:  18    Temp: 98.2 F (36.8 C) 98.3 F (36.8 C)    TempSrc: Oral Oral    SpO2:  100% 100% 100%  Weight:      Height:        Intake/Output Summary (Last 24 hours) at 04/11/16 1053 Last data filed at 04/11/16 0600  Gross per 24 hour  Intake              500 ml  Output              750 ml  Net             -250 ml   Filed Weights   03/29/16 2311 04/03/16 1500  Weight: 45.4 kg (100 lb 1.4 oz) 47.1 kg (103 lb 13.4 oz)    Examination:  General exam: Sitting up eating breakfast. Respiratory system: Clear to auscultation. Poor to fair air movement. No wheezing.  Cardiovascular system: RRR. No JVD, murmurs, rubs, gallops or clicks. No pedal edema. Gastrointestinal system: Abdomen is nondistended, soft and nontender. No organomegaly or masses felt. Normal bowel sounds heard. Central nervous system: Alert and oriented. No focal neurological deficits. Extremities: Symmetric 5 x 5 power. Skin: No rashes, lesions or ulcers Psychiatry: Judgement and insight appear fair. Mood & affect appropriate.     Data Reviewed: I have personally reviewed following labs and imaging studies  CBC:  Recent Labs Lab 04/05/16 0536 04/06/16 0526 04/08/16 0548 04/09/16 0542 04/11/16 0911  WBC 14.9* 14.5* 17.3* 14.8* 11.9*  NEUTROABS  --   --  14.0* 12.1* 9.3*  HGB 10.3* 11.2* 10.5* 9.3* 9.9*  HCT 31.3* 34.0* 32.8* 28.1* 30.0*  MCV 92.3 91.2 92.9 92.1 90.6  PLT 380 447* 494* 461* 584*   Basic Metabolic Panel:  Recent Labs Lab 04/05/16 0656 04/06/16 0526 04/08/16 0548 04/09/16 0542 04/11/16 0911  NA 135 136 133* 133* 135  K 2.7* 4.8 4.4 3.5 3.1*  CL 89* 95* 95* 97* 92*  CO2 37* 33* 30 29 35*  GLUCOSE 104* 101* 110* 94  113*  BUN 6 8 7  <5* <5*  CREATININE 0.50 0.41* 0.40* 0.34* 0.36*  CALCIUM 9.0 9.3 9.3 8.6* 9.0  MG 1.8 2.2  --   --  1.7  PHOS 3.5 3.5  --   --   --    GFR: Estimated Creatinine Clearance: 49.3 mL/min (by C-G formula based on SCr of 0.36 mg/dL (L)). Liver Function Tests: No results for input(s): AST, ALT, ALKPHOS, BILITOT, PROT, ALBUMIN in the last 168 hours. No results for input(s): LIPASE, AMYLASE in the last 168 hours. No results for input(s): AMMONIA in the last 168 hours. Coagulation Profile: No results for input(s): INR, PROTIME in the last 168 hours. Cardiac Enzymes: No results for input(s): CKTOTAL, CKMB, CKMBINDEX, TROPONINI in the last 168 hours. BNP (last 3 results) No results for input(s): PROBNP in the last 8760 hours. HbA1C: No results for input(s): HGBA1C in the last 72 hours. CBG: No results for input(s): GLUCAP in the last 168 hours. Lipid Profile: No results for input(s): CHOL, HDL, LDLCALC, TRIG, CHOLHDL, LDLDIRECT in the last 72 hours. Thyroid Function Tests: No results for input(s): TSH, T4TOTAL, FREET4, T3FREE, THYROIDAB in the last 72 hours. Anemia Panel: No results for input(s): VITAMINB12, FOLATE, FERRITIN, TIBC, IRON, RETICCTPCT in the last 72 hours. Sepsis Labs: No results for input(s): PROCALCITON, LATICACIDVEN in the last 168 hours.  Recent Results (from the past 240 hour(s))  Culture, blood (routine x 2) Call MD if unable to obtain prior to antibiotics being given     Status: None (Preliminary result)   Collection Time: 04/08/16  9:23 AM  Result Value Ref Range Status   Specimen Description BLOOD RIGHT ARM  Final   Special Requests IN PEDIATRIC BOTTLE 3CC  Final   Culture   Final    NO GROWTH 2 DAYS [facility]    Report Status PENDING  Incomplete  Culture, blood (routine x 2) Call MD if unable to obtain prior to antibiotics being given     Status: None (Preliminary result)   Collection Time: 04/08/16  9:29 AM  Result  Value Ref Range Status   Specimen Description BLOOD RIGHT ARM  Final   Special Requests BOTTLES DRAWN AEROBIC ONLY 5CC  Final   Culture   Final    NO GROWTH 2 DAYS [facility]    Report Status PENDING  Incomplete         Radiology Studies: No results found.      Scheduled Meds: . budesonide (PULMICORT) nebulizer solution  0.5 mg Nebulization BID  . buPROPion  150 mg Oral Daily  . escitalopram  10 mg Oral Daily  . feeding supplement  1 Container Oral TID BM  . guaiFENesin  30 mL Oral Q6H  . heparin subcutaneous  5,000 Units Subcutaneous Q8H  . ipratropium  0.5  mg Nebulization Q6H  . levalbuterol  0.63 mg Nebulization Q6H  . levofloxacin  750 mg Oral Daily  . levothyroxine  50 mcg Oral Daily  . magnesium sulfate 1 - 4 g bolus IVPB  4 g Intravenous Once  . mouth rinse  15 mL Mouth Rinse BID  . multivitamin with minerals  1 tablet Oral Daily  . potassium chloride  40 mEq Oral Q4H  . sodium chloride flush  3 mL Intravenous Q12H  . tiotropium  18 mcg Inhalation Daily   Continuous Infusions:   LOS: 13 days    Time spent: 35 minutes    THOMPSON,DANIEL, MD [facility] Pager [phone]  If 7PM-7AM, please contact night-coverage [email or web address] Password [email or web address] 04/11/2016, 10:53 AM

## 2016-04-12 DIAGNOSIS — J441 Chronic obstructive pulmonary disease with (acute) exacerbation: Secondary | ICD-10-CM | POA: Diagnosis not present

## 2016-04-12 DIAGNOSIS — E860 Dehydration: Secondary | ICD-10-CM | POA: Diagnosis not present

## 2016-04-12 DIAGNOSIS — R1314 Dysphagia, pharyngoesophageal phase: Secondary | ICD-10-CM | POA: Diagnosis not present

## 2016-04-12 DIAGNOSIS — J189 Pneumonia, unspecified organism: Secondary | ICD-10-CM | POA: Diagnosis not present

## 2016-04-12 DIAGNOSIS — R2681 Unsteadiness on feet: Secondary | ICD-10-CM | POA: Diagnosis not present

## 2016-04-12 DIAGNOSIS — S42331D Displaced oblique fracture of shaft of humerus, right arm, subsequent encounter for fracture with routine healing: Secondary | ICD-10-CM | POA: Diagnosis not present

## 2016-04-12 DIAGNOSIS — R413 Other amnesia: Secondary | ICD-10-CM | POA: Diagnosis not present

## 2016-04-12 DIAGNOSIS — K5909 Other constipation: Secondary | ICD-10-CM | POA: Diagnosis not present

## 2016-04-12 DIAGNOSIS — R0602 Shortness of breath: Secondary | ICD-10-CM | POA: Diagnosis not present

## 2016-04-12 DIAGNOSIS — R627 Adult failure to thrive: Secondary | ICD-10-CM | POA: Diagnosis not present

## 2016-04-12 DIAGNOSIS — M545 Low back pain: Secondary | ICD-10-CM | POA: Diagnosis not present

## 2016-04-12 DIAGNOSIS — J9602 Acute respiratory failure with hypercapnia: Secondary | ICD-10-CM | POA: Diagnosis not present

## 2016-04-12 DIAGNOSIS — J449 Chronic obstructive pulmonary disease, unspecified: Secondary | ICD-10-CM | POA: Diagnosis not present

## 2016-04-12 DIAGNOSIS — J939 Pneumothorax, unspecified: Secondary | ICD-10-CM | POA: Diagnosis not present

## 2016-04-12 DIAGNOSIS — F418 Other specified anxiety disorders: Secondary | ICD-10-CM | POA: Diagnosis not present

## 2016-04-12 DIAGNOSIS — R531 Weakness: Secondary | ICD-10-CM | POA: Diagnosis not present

## 2016-04-12 DIAGNOSIS — E876 Hypokalemia: Secondary | ICD-10-CM

## 2016-04-12 DIAGNOSIS — R2689 Other abnormalities of gait and mobility: Secondary | ICD-10-CM | POA: Diagnosis not present

## 2016-04-12 DIAGNOSIS — E440 Moderate protein-calorie malnutrition: Secondary | ICD-10-CM | POA: Diagnosis not present

## 2016-04-12 DIAGNOSIS — R0609 Other forms of dyspnea: Secondary | ICD-10-CM | POA: Diagnosis not present

## 2016-04-12 DIAGNOSIS — R41841 Cognitive communication deficit: Secondary | ICD-10-CM | POA: Diagnosis not present

## 2016-04-12 DIAGNOSIS — R279 Unspecified lack of coordination: Secondary | ICD-10-CM | POA: Diagnosis not present

## 2016-04-12 DIAGNOSIS — M6281 Muscle weakness (generalized): Secondary | ICD-10-CM | POA: Diagnosis not present

## 2016-04-12 DIAGNOSIS — M25511 Pain in right shoulder: Secondary | ICD-10-CM | POA: Diagnosis not present

## 2016-04-12 LAB — BASIC METABOLIC PANEL
Anion gap: 8 (ref 5–15)
BUN: 5 mg/dL — AB (ref 6–20)
CHLORIDE: 95 mmol/L — AB (ref 101–111)
CO2: 35 mmol/L — ABNORMAL HIGH (ref 22–32)
Calcium: 9.4 mg/dL (ref 8.9–10.3)
Creatinine, Ser: 0.43 mg/dL — ABNORMAL LOW (ref 0.44–1.00)
Glucose, Bld: 109 mg/dL — ABNORMAL HIGH (ref 65–99)
POTASSIUM: 3.8 mmol/L (ref 3.5–5.1)
SODIUM: 138 mmol/L (ref 135–145)

## 2016-04-12 LAB — MAGNESIUM: MAGNESIUM: 2.2 mg/dL (ref 1.7–2.4)

## 2016-04-12 MED ORDER — IPRATROPIUM BROMIDE 0.02 % IN SOLN
0.5000 mg | Freq: Four times a day (QID) | RESPIRATORY_TRACT | Status: DC
  Filled 2016-04-12: qty 2.5

## 2016-04-12 MED ORDER — LEVOFLOXACIN 500 MG PO TABS: 500.0000 mg | ORAL_TABLET | Freq: Every day | ORAL | 0 refills | Status: DC

## 2016-04-12 MED ORDER — LEVOFLOXACIN 750 MG PO TABS: 750.0000 mg | ORAL_TABLET | Freq: Every day | ORAL | 0 refills | Status: DC

## 2016-04-12 MED ORDER — LEVALBUTEROL HCL 0.63 MG/3ML IN NEBU
0.6300 mg | INHALATION_SOLUTION | Freq: Four times a day (QID) | RESPIRATORY_TRACT | Status: DC
  Filled 2016-04-12: qty 3

## 2016-04-12 NOTE — Progress Notes (Signed)
Date: April 12, 2016 Discharge orders checked for needs. No case management needs present at time of discharge. Rhonda Davis, RN, BSN, CCM   [phone] 

## 2016-04-12 NOTE — Discharge Summary (Signed)
Physician Discharge Summary  Cheyenne [name] ZHY:[id number] DOB: [birth year] DOA: 03/29/2016  PCP: [name], MD  Admit date: 03/29/2016 Discharge date: 04/12/2016  Admitted From: Home Disposition:  SNF  Recommendations for Outpatient Follow-up:  1. Follow up with PCP in 1-2 weeks 2. Please obtain BMP/CBC in one week 3. Complete levofloxacin after 12/20 dose. Last dose received on 12/28   Discharge Condition:Stable CODE STATUS:DNR Diet recommendation: Dysphagia 2 with thin liquids   Brief/Interim Summary: 70 year old female, being seen by Dr, [name] for severe COPD, on 3L O2 24/7, former smoker, history of ETOH abuse, chronic pain, comes in with worsening SOB. Patient was last seen by Dr. [name] at the office in October 2017. She was noted to be in mild flare for COPD and was given prednisone taper over one week and levofloxacin for 7 days. She improved and went back to baseline. During that office visit note, she mentioned about having a fall. Pt admitted by [facility] service with a pneumothorax and COPD exacerbation s/p chest tube. Transferred to [facility] 12/10.  Patient with severe COPD, on 3 L oxygen 24/7, with recent acute exacerbation of COPD likely secondary to bronchitis/PNA, comes in after repeated falls over >pneumothorax on the left lung base on chest x-ray. S/P L CT by [facility] on 12/4. Patient was to be discharged yesterday however complains of shortness of breath and noted to be anxious and a such discharge was canceled. Patient tachycardic with complaints of shortness of breath. CT angiogram negative for PE however consistent with an pneumonia. Patient placed empirically on IV vancomycin and IV cefepime with continued improvement. Patient subsequently transitioned to oral Levaquin on 04/11/2016 and remained stable  #1 acute on chronic hypoxemic respiratory failure with hypercapnia secondary to acute COPD exacerbation/significant pneumothorax on the left secondary to  fall Patient status post left chest tube 03/29/2016 per cardiothoracic surgery (Dr. [name]), chest tube removed 12/11 with improvement on repeat x-rays with no recurrence. Recurrent pneumothorax left 12/ 6 after being briefly placed on waterseal 12 /5. 04/04/2016 for expansion of left lung on waterseal on 2 L oxygen baseline is 3 L. Patient was seen by critical care medicine and placed on Pulmicort nebs twice daily, duo nebs 4 times a day with goal to keep sats greater than 88%. Chest tube removed, repeat films good and patient given clearance by pulmonary for discharge with outpatient follow-up with Dr. [name] in 2 weeks. Patient was supposed to be discharged however complains of shortness of breath and noted to be tachycardic.Patient now with HCAP. Per Family patient with significant immobility prior to admission. CT angiogram chest negative for pulmonary emboli however consistent with right lower lobe pneumonia. Sputum Gram stain and culture negative. Urine Legionella antigen and urine pneumococcus antigen was negative. Blood cultures with no growth to date. Patient was initially placed empirically on IV vancomycin and IV cefepime and will be transitioned to oral Levaquin. Patient remained stable  #2 tachycardia Patient noted to be tachycardic however patient with anxiety disorder. Likely secondary to pneumonia noted on CT angiogram chest. EKG consistent with a sinus tachycardia. CT angiogram chest negative for pulmonary emboli and consistent with right lower lobe airspace disease concerning for pneumonia. Tachycardia improved. Patient has been pancultured. Continue nebulizer treatments. IV antibiotics will be narrowed to oral Levaquin.   #3 chronic pain and anxiety disorder Continue current pain regimen.  #4 right humerus fracture Patient's films have been seen by patient's orthopod, [name] who recommended to continue sarmiento brace, continue range of motion with  outpatient  follow-up in one month.  #5 healthcare associated pneumonia Over the past 1-2 days patient had complaints of worsening shortness of breath and some chest pain noted to be tachycardic. CT angiogram of the chest which was done was negative for pulmonary emboli however was consistent with a right lower lobe infiltrate. Patient with a worsening leukocytosis with a white count of 17.3. Patient afebrile. Sputum Gram stain and culture negative. Urine Legionella antigen and urine pneumococcus antigen negative. Blood cultures with no growth to date. Patient IV vancomycin IV cefepime were discontinued and patient started on oral clindamycin today 04/11/2016. Will discontinue oral clindamycin and place patient on oral Levaquin.  #6 hypokalemia/hypomagnesemia Replaced  Discharge Diagnoses:  Principal Problem:   Respiratory failure with hypercapnia (HCC) Active Problems:   COPD exacerbation (HCC)   Hypokalemia   Pneumothorax   Chest tube in place   Pneumothorax on left   Shortness of breath   Malnutrition of moderate degree   Tachycardia   Right arm fracture   SOB (shortness of breath)   HCAP (healthcare-associated pneumonia)    Discharge Instructions  Discharge Instructions    Increase activity slowly    Complete by:  As directed      Allergies as of 04/12/2016      Reactions   Penicillins Anaphylaxis   Has patient had a PCN reaction causing immediate rash, facial/tongue/throat swelling, SOB or lightheadedness with hypotension: yes Has patient had a PCN reaction causing severe rash involving mucus membranes or skin necrosis: unknown Has patient had a PCN reaction that required hospitalization: unknown Has patient had a PCN reaction occurring within the last 10 years: no If all of the above answers are "NO", then may proceed with Cephalosporin use. HAS TOLERATED AMPICILLIN SINCE RX AS CHILD   Promethazine Hcl Diarrhea, Nausea And Vomiting      Medication List    STOP taking these  medications   MUCINEX SINUS-MAX DAY/NIGHT Liquid Misc Generic drug:  PE-GG-APAP & PE-DPH-APAP   oxyCODONE 5 MG immediate release tablet Commonly known as:  Oxy IR/ROXICODONE   oxyCODONE-acetaminophen 5-325 MG tablet Commonly known as:  PERCOCET/ROXICET   predniSONE 10 MG tablet Commonly known as:  DELTASONE     TAKE these medications   BREO ELLIPTA 100-25 MCG/INH Aepb Generic drug:  fluticasone furoate-vilanterol INHALE ONE PUFF INTO THE LUNGS ONCE DAILY   buPROPion 150 MG 24 hr tablet Commonly known as:  WELLBUTRIN XL Take 150 mg by mouth daily.   calcium carbonate 600 MG Tabs tablet Commonly known as:  OS-CAL Take 600 mg by mouth daily.   escitalopram 10 MG tablet Commonly known as:  LEXAPRO Take 10 mg by mouth daily.   feeding supplement Liqd Take 1 Container by mouth 3 (three) times daily between meals.   Fish Oil 1000 MG Caps Take 1 capsule by mouth every evening.   fluticasone 50 MCG/ACT nasal spray Commonly known as:  FLONASE PLACE 2 SPRAYS INTO BOTH NOSTRILS DAILY What changed:  See the new instructions.   FLUTTER Devi Use as directed   glucosamine-chondroitin 500-400 MG tablet Take 1 tablet by mouth 2 (two) times daily.   HYDROcodone-acetaminophen 7.5-325 MG tablet Commonly known as:  NORCO Take 1 tablet by mouth 3 (three) times daily as needed. What changed:  how much to take  reasons to take this   levofloxacin 500 MG tablet Commonly known as:  LEVAQUIN Take 1 tablet (500 mg total) by mouth daily. Start taking on:  04/13/2016   levothyroxine 50  MCG tablet Commonly known as:  SYNTHROID, LEVOTHROID Take 50 mcg by mouth daily.   methylcellulose 1 % ophthalmic solution Commonly known as:  ARTIFICIAL TEARS Place 1 drop into both eyes daily as needed (allergies/dry eyes).   multivitamin with minerals Tabs tablet Take 1 tablet by mouth daily.   OXYGEN 24/7- 2lpm with sleep and 2-4 with exertion   PROAIR HFA 108 (90 Base) MCG/ACT  inhaler Generic drug:  albuterol INHALE UP TO 2 PUFFS EVERY 4 HOURS AS NEEDED What changed:  See the new instructions.   SPIRIVA HANDIHALER 18 MCG inhalation capsule Generic drug:  tiotropium USE ONE (1) CAPSULE INHALATION ONCE DAILY What changed:  See the new instructions.      Follow-up Information    THOMPSON, DAVID A., MD. Schedule an appointment as soon as possible for a visit.   Specialty:  Orthopedic Surgery Why:  for first week in January Contact information: [address] [place] [address] [phone]        Leslye Peer., MD. Schedule an appointment as soon as possible for a visit in 2 week(s).   Specialty:  Pulmonary Disease Contact information: [address] [place] [address] [phone]        [name], MD. Schedule an appointment as soon as possible for a visit in 1 week(s).   Specialty:  Family Medicine Why:  Hospital Follow uP  Contact information: [address] [place] [address] [phone]          Allergies  Allergen Reactions  . Penicillins Anaphylaxis    Has patient had a PCN reaction causing immediate rash, facial/tongue/throat swelling, SOB or lightheadedness with hypotension: yes Has patient had a PCN reaction causing severe rash involving mucus membranes or skin necrosis: unknown Has patient had a PCN reaction that required hospitalization: unknown Has patient had a PCN reaction occurring within the last 10 years: no If all of the above answers are "NO", then may proceed with Cephalosporin use. HAS TOLERATED AMPICILLIN SINCE RX AS CHILD   . Promethazine Hcl Diarrhea and Nausea And Vomiting    Consultations:  Pulmonary  Orthopedic Surgery  Procedures/Studies: Dg Chest 2 View  Result Date: 03/29/2016 CLINICAL DATA:  Fall. Recent humerus fracture. Cough. Congestion. COPD/asthma. EXAM: CHEST  2 VIEW COMPARISON:  02/10/2016 FINDINGS: Moderately degraded lateral view, secondary to patient  positioning, including arms not raised above the head. Mid thoracic compression deformity is moderate and similar. Midline trachea. Normal heart size. Atherosclerosis in the transverse aorta. No pleural fluid. Moderate left-sided pneumothorax, primarily identified inferiorly and laterally. On the order of 40%. Underlying COPD/chronic bronchitis, as evidenced by interstitial thickening and hyperinflation. IMPRESSION: Moderate size left-sided pneumothorax. Aortic atherosclerosis. Critical test results telephoned to dr. Adela Lank. at the time of interpretation at 11:56 a.m.on 03/29/2016. Electronically Signed   By: Jeronimo Greaves M.D.   On: 03/29/2016 11:56   Ct Head Wo Contrast  Result Date: 03/29/2016 CLINICAL DATA:  Recent falls.  Right arm fracture. EXAM: CT HEAD WITHOUT CONTRAST CT CERVICAL SPINE WITHOUT CONTRAST TECHNIQUE: Multidetector CT imaging of the head and cervical spine was performed following the standard protocol without intravenous contrast. Multiplanar CT image reconstructions of the cervical spine were also generated. COMPARISON:  Cervical spine myelogram 05/07/2010 FINDINGS: CT HEAD FINDINGS Brain: Mild generalized atrophy and white matter disease is somewhat advanced for age no acute infarct, hemorrhage, or mass lesion is present. The ventricles are proportionate to the degree of atrophy. No significant extra-axial fluid collection is present. Vascular: Atherosclerotic  calcifications are present within the cavernous internal carotid arteries bilaterally. There is no hyperdense vessel. Skull: The calvarium is intact. No acute fractures are present. No focal lytic or blastic lesions are present. No significant extracranial soft tissue injury is present. Sinuses/Orbits: The paranasal sinuses and mastoid air cells are clear. Bilateral lens replacements are present. The globes and orbits are otherwise intact. CT CERVICAL SPINE FINDINGS Alignment: Slight retrolisthesis at C3-4 at C4-5 is stable. AP  alignment is otherwise anatomic. Skull base and vertebrae: The craniocervical junction is within normal limits. Soft tissues and spinal canal: Dense atherosclerotic calcifications are present at the carotid bifurcations bilaterally with possible stenosis on the left. The soft tissues the neck are otherwise unremarkable. The spinal canal is grossly patent. Disc levels: Multilevel stenosis of the cervical spine is again noted. The most severe level is at C4-5 with moderate central and bilateral foraminal stenosis. Upper chest: A prominent left pneumothorax is noted. This was called earlier today. Severe centrilobular emphysema is noted. IMPRESSION: 1. Persistent left-sided pneumothorax. 2. Emphysema. 3. Multilevel spondylosis of the cervical spine without acute fracture or traumatic subluxation. 4. Moderate atrophy and white matter disease likely reflects the sequela of chronic microvascular ischemia. 5. No acute intracranial abnormality. These results will be called to the ordering clinician or representative by the Radiologist Assistant, and communication documented in the PACS or zVision Dashboard. Electronically Signed   By: Marin Roberts M.D.   On: 03/29/2016 14:23   Ct Chest Wo Contrast  Result Date: 03/29/2016 CLINICAL DATA:  Chest tightness and worsening cough in patient with history of COPD. Pneumothorax by plain films earlier today. EXAM: CT CHEST WITHOUT CONTRAST TECHNIQUE: Multidetector CT imaging of the chest was performed following the standard protocol without IV contrast. COMPARISON:  PA and lateral chest this same day and 07/31/2015. CT chest 11/20/2012. FINDINGS: Cardiovascular: Calcific aortic and coronary atherosclerosis is identified. No pericardial effusion. Heart size is normal. Mediastinum/Nodes: No enlarged mediastinal or axillary lymph nodes. Thyroid gland, trachea and esophagus demonstrate no significant findings. Lungs/Pleura: The patient has a known left hydropneumothorax with  only a small amount of pleural fluid. Pneumothorax estimated at 70%. The lungs demonstrate extensive emphysematous disease. There is some atelectasis bilaterally, worse on the left. Upper Abdomen: The patient is status post cholecystectomy. No acute abnormality is identified. Musculoskeletal: T7 and T8 compression fracture appears are identified as seen 07/31/2015 exam. No worrisome lesion is identified. IMPRESSION: Left hydropneumothorax. There is only a small amount of pleural fluid. Pneumothorax is estimated at 70%. Severe emphysema. Atherosclerosis. Critical Value/emergent results were called by telephone at the time of interpretation on 03/29/2016 at 5:25 pm to [facility] who verbally acknowledged these results. Electronically Signed   By: Drusilla Kanner M.D.   On: 03/29/2016 17:27   Ct Angio Chest Pe W Or Wo Contrast  Result Date: 04/07/2016 CLINICAL DATA:  Shortness of breath, tachycardia EXAM: CT ANGIOGRAPHY CHEST WITH CONTRAST TECHNIQUE: Multidetector CT imaging of the chest was performed using the standard protocol during bolus administration of intravenous contrast. Multiplanar CT image reconstructions and MIPs were obtained to evaluate the vascular anatomy. CONTRAST:  56.6 mL Isovue 370 COMPARISON:  03/29/2016, 05/12/2012 FINDINGS: Cardiovascular: Satisfactory opacification of the pulmonary arteries to the segmental level. No evidence of pulmonary embolism. Normal heart size. No pericardial effusion. Thoracic aortic atherosclerosis. Coronary artery atherosclerosis of the LAD, ramus intermedius and RCA. Mediastinum/Nodes: No enlarged mediastinal, hilar, or axillary lymph nodes. Thyroid gland, trachea, and esophagus demonstrate no significant findings. Right lower lobe  airspace disease. Lungs/Pleura: No pneumothorax. Severe bilateral emphysematous changes most severe in the left lower lobe. No pleural effusion. Right lower lobe airspace disease. 7 mm right lower lobe pulmonary nodule  unchanged compared with 05/12/2012. Upper Abdomen: No acute abnormality. Musculoskeletal: No acute osseous abnormality. Chronic T7 and T8 vertebral body compression fractures. Degenerative disc disease throughout the thoracic spine. Small amount of soft tissue emphysema in the left lateral chest wall. Review of the MIP images confirms the above findings. IMPRESSION: 1. No evidence of pulmonary embolus. 2. Right lower lobe airspace disease concerning for pneumonia. 3.  Aortic Atherosclerosis (ICD10-170.0) 4.  Emphysema. ([id number]) Electronically Signed   By: Elige Ko   On: 04/07/2016 18:16   Ct Cervical Spine Wo Contrast  Result Date: 03/29/2016 CLINICAL DATA:  Recent falls.  Right arm fracture. EXAM: CT HEAD WITHOUT CONTRAST CT CERVICAL SPINE WITHOUT CONTRAST TECHNIQUE: Multidetector CT imaging of the head and cervical spine was performed following the standard protocol without intravenous contrast. Multiplanar CT image reconstructions of the cervical spine were also generated. COMPARISON:  Cervical spine myelogram 05/07/2010 FINDINGS: CT HEAD FINDINGS Brain: Mild generalized atrophy and white matter disease is somewhat advanced for age no acute infarct, hemorrhage, or mass lesion is present. The ventricles are proportionate to the degree of atrophy. No significant extra-axial fluid collection is present. Vascular: Atherosclerotic calcifications are present within the cavernous internal carotid arteries bilaterally. There is no hyperdense vessel. Skull: The calvarium is intact. No acute fractures are present. No focal lytic or blastic lesions are present. No significant extracranial soft tissue injury is present. Sinuses/Orbits: The paranasal sinuses and mastoid air cells are clear. Bilateral lens replacements are present. The globes and orbits are otherwise intact. CT CERVICAL SPINE FINDINGS Alignment: Slight retrolisthesis at C3-4 at C4-5 is stable. AP alignment is otherwise anatomic. Skull base and  vertebrae: The craniocervical junction is within normal limits. Soft tissues and spinal canal: Dense atherosclerotic calcifications are present at the carotid bifurcations bilaterally with possible stenosis on the left. The soft tissues the neck are otherwise unremarkable. The spinal canal is grossly patent. Disc levels: Multilevel stenosis of the cervical spine is again noted. The most severe level is at C4-5 with moderate central and bilateral foraminal stenosis. Upper chest: A prominent left pneumothorax is noted. This was called earlier today. Severe centrilobular emphysema is noted. IMPRESSION: 1. Persistent left-sided pneumothorax. 2. Emphysema. 3. Multilevel spondylosis of the cervical spine without acute fracture or traumatic subluxation. 4. Moderate atrophy and white matter disease likely reflects the sequela of chronic microvascular ischemia. 5. No acute intracranial abnormality. These results will be called to the ordering clinician or representative by the Radiologist Assistant, and communication documented in the PACS or zVision Dashboard. Electronically Signed   By: Marin Roberts M.D.   On: 03/29/2016 14:23   Dg Chest Port 1 View  Result Date: 04/06/2016 CLINICAL DATA:  Follow-up pneumothorax EXAM: PORTABLE CHEST 1 VIEW COMPARISON:  Portable chest x-ray of April 05, 2016 and July 31, 2015. FINDINGS: The lungs are mildly hyperinflated. The pleural line demonstrated on yesterday's study on the left in the upper hemithorax is not evident today. There remains hyperlucency in the mid and lower left hemithorax which may reflect a pneumothorax lying anterior or posterior to re- inflated lung parenchyma or may be normal for the patient. The right lung is clear. The heart and pulmonary vascularity are normal. There is calcification in the wall of the aortic arch. A small amount of subcutaneous emphysema is noted on the  left. IMPRESSION: Interval clearing of the small apical pneumothorax. No  definite residual pneumothorax though there is hyperlucency in the inferior aspect of the left hemithorax which is similar to that seen on the July 31, 2015 study. Thoracic aortic atherosclerosis. Electronically Signed   By: David  [place] M.D.   On: 04/06/2016 07:55   Dg Chest Port 1 View  Result Date: 04/05/2016 CLINICAL DATA:  Follow-up for pneumothorax. Status post left chest tube removal. EXAM: PORTABLE CHEST 1 VIEW COMPARISON:  04/04/2016 FINDINGS: Since prior exam, the left chest tube has been removed. There is a minimal left apical pneumothorax. Lungs remain hyperexpanded. There is stable scarring. No acute abnormalities in the lungs. No pleural effusion. Cardiac silhouette is normal in size. No mediastinal or hilar masses. Left lateral chest wall subcutaneous emphysema is stable from the prior study. IMPRESSION: 1. Minimal left apical pneumothorax is seen following left chest tube removal. 2. No other change. Electronically Signed   By: Amie Portland M.D.   On: 04/05/2016 15:30   Dg Chest Port 1 View  Result Date: 04/04/2016 CLINICAL DATA:  COPD.  Pneumothorax followup EXAM: PORTABLE CHEST 1 VIEW COMPARISON:  04/03/2016 FINDINGS: Left chest tube remains in place.  No pneumothorax. COPD with hyperinflation of the lungs. Right lower lobe mild airspace disease unchanged. Negative for heart failure or effusion. IMPRESSION: Left chest tube in place.  No pneumothorax No change right lower lobe airspace disease. Electronically Signed   By: Marlan Palau M.D.   On: 04/04/2016 06:54   Dg Chest Port 1 View  Result Date: 04/03/2016 CLINICAL DATA:  Followup LEFT pneumothorax EXAM: PORTABLE CHEST 1 VIEW COMPARISON:  Portable exam 1448 hours compared to 0455 hours FINDINGS: LEFT thoracostomy tube stable. Normal heart size, mediastinal contours, and pulmonary vascularity. Lungs hyperinflated/emphysematous but clear. No acute infiltrate or pleural effusion. No pneumothorax. LEFT lateral chest wall emphysema  again noted. Bones demineralized. IMPRESSION: LEFT chest tube without pneumothorax. Underlying emphysematous changes. Electronically Signed   By: Ulyses Southward M.D.   On: 04/03/2016 15:10   Dg Chest Port 1 View  Result Date: 04/03/2016 CLINICAL DATA:  Patient with pneumothorax.  Re-evaluation. EXAM: PORTABLE CHEST 1 VIEW COMPARISON:  Chest radiograph 04/02/2016. FINDINGS: Monitoring leads overlie the patient. Normal cardiac and mediastinal contours. Emphysematous change. Left chest tube remains in place. No definite left-sided pneumothorax. Subcutaneous emphysema left chest wall. IMPRESSION: No definite pneumothorax.  Left chest tube in place. Electronically Signed   By: Annia Belt M.D.   On: 04/03/2016 07:01   Dg Chest Port 1 View  Result Date: 04/02/2016 CLINICAL DATA:  Pneumothorax EXAM: PORTABLE CHEST 1 VIEW COMPARISON:  04/01/2016 FINDINGS: Cardiomediastinal silhouette is stable. Left chest tube is unchanged in position. Subcutaneous emphysema left chest wall. No infiltrate or pulmonary edema. No pneumothorax. IMPRESSION: Stable left chest tube position. No pneumothorax. Subcutaneous emphysema left chest wall again noted. Electronically Signed   By: Natasha Mead M.D.   On: 04/02/2016 09:05   Dg Chest Port 1 View  Result Date: 04/01/2016 CLINICAL DATA:  COPD. Status post fall 03/27/2016. Pneumothorax diagnosed 03/29/2016. Chest tube in place. EXAM: PORTABLE CHEST 1 VIEW COMPARISON:  Single-view of the chest 03/31/2016 and 03/30/2016. FINDINGS: Left chest tube remains in place. Subcutaneous emphysema is seen along the left chest wall. No pneumothorax is identified. The lungs are emphysematous. No focal airspace disease. Heart size normal. Atherosclerosis noted. IMPRESSION: Left chest tube in place.  Negative for pneumothorax. Emphysema. Atherosclerosis. Electronically Signed   By: Drusilla Kanner M.D.  On: 04/01/2016 07:51   Dg Chest Port 1 View  Result Date: 03/31/2016 CLINICAL DATA:   Pneumothorax. EXAM: PORTABLE CHEST 1 VIEW COMPARISON:  Portable film earlier today. FINDINGS: LEFT chest tube is again noted. The LEFT lung is now re-expanded. No significant LEFT pneumothorax is observed. Mild subsegmental atelectasis at both bases without consolidation or effusion. Normal cardiomediastinal silhouette. Bones unremarkable. IMPRESSION: No significant pneumothorax is now observed. Re-expansion of the LEFT lung. Electronically Signed   By: Elsie StainJohn T Curnes M.D.   On: 03/31/2016 10:37   Dg Chest Port 1 View  Result Date: 03/31/2016 CLINICAL DATA:  Follow-up pneumothorax EXAM: PORTABLE CHEST 1 VIEW COMPARISON:  03/30/2016 FINDINGS: Left-sided thoracostomy catheter is again noted in stable position. There has been recurrence of the patient's left-sided pneumothorax however predominately involving the upper lobe. Mild bibasilar atelectatic changes are noted particularly on the left. No other focal abnormality is seen. IMPRESSION: Recurrent left pneumothorax involving predominately the upper lobe. Critical Value/emergent results were called by telephone at the time of interpretation on 03/31/2016 at 7:25 am to [facility], the pts nurse, who verbally acknowledged these results. Electronically Signed   By: Alcide CleverMark  Lukens M.D.   On: 03/31/2016 07:27   Dg Chest Port 1 View  Result Date: 03/30/2016 CLINICAL DATA:  Left pneumothorax. COPD. Respiratory failure with hypercapnia. EXAM: PORTABLE CHEST 1 VIEW COMPARISON:  03/29/2016 FINDINGS: Left chest tube remains in place. No residual pneumothorax visualized. Decreased atelectasis or infiltrate is seen in the left retrocardiac lung base. Right lung is clear. Heart size is normal. IMPRESSION: Decreased atelectasis or infiltrate in the left track lung base. No residual pneumothorax visualized. Electronically Signed   By: Myles RosenthalJohn  Stahl M.D.   On: 03/30/2016 07:18   Dg Chest Portable 1 View  Result Date: 03/29/2016 CLINICAL DATA:  Status post left chest tube placement  today for pneumothorax. EXAM: PORTABLE CHEST 1 VIEW COMPARISON:  CT chest and PA and lateral chest earlier today. FINDINGS: New left chest tube is in place. Left pneumothorax is nearly completely resolved with only a very small apical component remaining. Lungs are emphysematous. IMPRESSION: Near complete resolution of left pneumothorax after chest tube placement. Emphysema. Electronically Signed   By: Drusilla Kannerhomas  Dalessio M.D.   On: 03/29/2016 20:07   Dg Knee Complete 4 Views Right  Result Date: 03/29/2016 CLINICAL DATA:  Fall EXAM: RIGHT KNEE - COMPLETE 4+ VIEW COMPARISON:  None. FINDINGS: Four views of the right knee submitted. No acute fracture or subluxation. There is mild narrowing of medial joint compartment. Chondrocalcinosis is noted. Mild narrowing of patellofemoral joint space. IMPRESSION: No acute fracture or subluxation. Chondrocalcinosis. Osteoarthritic changes. Electronically Signed   By: Natasha MeadLiviu  Pop M.D.   On: 03/29/2016 17:26   Dg Humerus Right  Result Date: 04/05/2016 CLINICAL DATA:  Follow-up of right humeral fracture from 3 weeks ago. History of osteopenia. EXAM: RIGHT HUMERUS - 2+ VIEW COMPARISON:  Right humerus series of March 03, 2016. FINDINGS: The images are obtained in a splint. The midshaft spiral fracture is again demonstrated. The angulation has been reduced. The fracture fragments remain displaced by 1/2 shaft width. There is periosteal reaction noted about the fracture site. IMPRESSION: Ongoing healing of the mildly displaced midshaft right humeral fracture. Periosteal reaction is present but no bony bridging is observed. Electronically Signed   By: David  SwazilandJordan M.D.   On: 04/05/2016 15:31    Subjective: No complaints  Discharge Exam: Vitals:   04/12/16 0510 04/12/16 0515  BP: (!) 153/82   Pulse: Marland Kitchen(!)  146 (!) 101  Resp: 19   Temp: 98.7 F (37.1 C)    Vitals:   04/11/16 1335 04/11/16 2030 04/12/16 0510 04/12/16 0515  BP: (!) 134/104 116/69 (!) 153/82   Pulse: (!)  102 95 (!) 146 (!) 101  Resp: 20 19 19    Temp: 98.7 F (37.1 C) 98.5 F (36.9 C) 98.7 F (37.1 C)   TempSrc: Oral Oral Oral   SpO2: 96% 97% 97%   Weight:      Height:        General: Pt is alert, awake, not in acute distress Cardiovascular: RRR, S1/S2 +, no rubs, no gallops Respiratory: CTA bilaterally, no wheezing, no rhonchi Abdominal: Soft, NT, ND, bowel sounds + Extremities: no edema, no cyanosis   The results of significant diagnostics from this hospitalization (including imaging, microbiology, ancillary and laboratory) are listed below for reference.     Microbiology: Recent Results (from the past 240 hour(s))  Culture, blood (routine x 2) Call MD if unable to obtain prior to antibiotics being given     Status: None (Preliminary result)   Collection Time: 04/08/16  9:23 AM  Result Value Ref Range Status   Specimen Description BLOOD RIGHT ARM  Final   Special Requests IN PEDIATRIC BOTTLE 3CC  Final   Culture   Final    NO GROWTH 3 DAYS Performed at [facility]    Report Status PENDING  Incomplete  Culture, blood (routine x 2) Call MD if unable to obtain prior to antibiotics being given     Status: None (Preliminary result)   Collection Time: 04/08/16  9:29 AM  Result Value Ref Range Status   Specimen Description BLOOD RIGHT ARM  Final   Special Requests BOTTLES DRAWN AEROBIC ONLY 5CC  Final   Culture   Final    NO GROWTH 3 DAYS Performed at [facility]    Report Status PENDING  Incomplete     Labs: BNP (last 3 results) No results for input(s): BNP in the last 8760 hours. Basic Metabolic Panel:  Recent Labs Lab 04/06/16 0526 04/08/16 0548 04/09/16 0542 04/11/16 0911 04/12/16 0555  NA 136 133* 133* 135 138  K 4.8 4.4 3.5 3.1* 3.8  CL 95* 95* 97* 92* 95*  CO2 33* 30 29 35* 35*  GLUCOSE 101* 110* 94 113* 109*  BUN 8 7 <5* <5* 5*  CREATININE 0.41* 0.40* 0.34* 0.36* 0.43*  CALCIUM 9.3 9.3 8.6* 9.0 9.4  MG 2.2  --   --  1.7 2.2  PHOS  3.5  --   --   --   --    Liver Function Tests: No results for input(s): AST, ALT, ALKPHOS, BILITOT, PROT, ALBUMIN in the last 168 hours. No results for input(s): LIPASE, AMYLASE in the last 168 hours. No results for input(s): AMMONIA in the last 168 hours. CBC:  Recent Labs Lab 04/06/16 0526 04/08/16 0548 04/09/16 0542 04/11/16 0911  WBC 14.5* 17.3* 14.8* 11.9*  NEUTROABS  --  14.0* 12.1* 9.3*  HGB 11.2* 10.5* 9.3* 9.9*  HCT 34.0* 32.8* 28.1* 30.0*  MCV 91.2 92.9 92.1 90.6  PLT 447* 494* 461* 584*   Cardiac Enzymes: No results for input(s): CKTOTAL, CKMB, CKMBINDEX, TROPONINI in the last 168 hours. BNP: Invalid input(s): POCBNP CBG: No results for input(s): GLUCAP in the last 168 hours. D-Dimer No results for input(s): DDIMER in the last 72 hours. Hgb A1c No results for input(s): HGBA1C in the last 72 hours. Lipid Profile No  results for input(s): CHOL, HDL, LDLCALC, TRIG, CHOLHDL, LDLDIRECT in the last 72 hours. Thyroid function studies No results for input(s): TSH, T4TOTAL, T3FREE, THYROIDAB in the last 72 hours.  Invalid input(s): FREET3 Anemia work up No results for input(s): VITAMINB12, FOLATE, FERRITIN, TIBC, IRON, RETICCTPCT in the last 72 hours. Urinalysis    Component Value Date/Time   COLORURINE YELLOW 03/29/2016 1211   APPEARANCEUR CLEAR 03/29/2016 1211   LABSPEC 1.008 03/29/2016 1211   PHURINE 6.5 03/29/2016 1211   GLUCOSEU NEGATIVE 03/29/2016 1211   HGBUR SMALL (A) 03/29/2016 1211   BILIRUBINUR NEGATIVE 03/29/2016 1211   KETONESUR 40 (A) 03/29/2016 1211   PROTEINUR NEGATIVE 03/29/2016 1211   UROBILINOGEN 0.2 12/12/2006 1153   NITRITE NEGATIVE 03/29/2016 1211   LEUKOCYTESUR NEGATIVE 03/29/2016 1211   Sepsis Labs Invalid input(s): PROCALCITONIN,  WBC,  LACTICIDVEN Microbiology Recent Results (from the past 240 hour(s))  Culture, blood (routine x 2) Call MD if unable to obtain prior to antibiotics being given     Status: None (Preliminary result)    Collection Time: 04/08/16  9:23 AM  Result Value Ref Range Status   Specimen Description BLOOD RIGHT ARM  Final   Special Requests IN PEDIATRIC BOTTLE 3CC  Final   Culture   Final    NO GROWTH 3 DAYS [facility]formed at [facility]    Report Status PENDING  Incomplete  Culture, blood (routine x 2) Call MD if unable to obtain prior to antibiotics being given     Status: None (Preliminary result)   Collection Time: 04/08/16  9:29 AM  Result Value Ref Range Status   Specimen Description BLOOD RIGHT ARM  Final   Special Requests BOTTLES DRAWN AEROBIC ONLY 5CC  Final   Culture   Final    NO GROWTH 3 DAYS Performed at [facility]    Report Status PENDING  Incomplete     SIGNED:   Jerald KiefHIU, STEPHEN K, MD  [facility] 04/12/2016, 11:13 AM  If 7PM-7AM, please contact night-coverage [email or web address] Password [email or web address]

## 2016-04-12 NOTE — Progress Notes (Signed)
Called report to [facility]. Gave report to RN. Awaiting PTAR

## 2016-04-12 NOTE — Clinical Social Work Placement (Signed)
   CLINICAL SOCIAL WORK PLACEMENT  NOTE  Date:  04/12/2016  Patient Details  Name: Cheyenne [name] MRN: [id number] Date of Birth: 1945/11/01  Clinical Social Work is seeking post-discharge placement for this patient at the Skilled  Nursing Facility level of care (*CSW will initial, date and re-position this form in  chart as items are completed):  Yes   Patient/family provided with [facility] Clinical Social Work Department's list of facilities offering this level of care within the geographic area requested by the patient (or if unable, by the patient's family).  Yes   Patient/family informed of their freedom to choose among providers that offer the needed level of care, that participate in Medicare, Medicaid or managed care program needed by the patient, have an available bed and are willing to accept the patient.  Yes   Patient/family informed of [facility]'s ownership interest in [facility] and [facility], as well as of the fact that they are under no obligation to receive care at these facilities.  PASRR submitted to EDS on       PASRR number received on       Existing PASRR number confirmed on 04/12/16     FL2 transmitted to all facilities in geographic area requested by pt/family on       FL2 transmitted to all facilities within larger geographic area on 04/08/16     Patient informed that his/her managed care company has contracts with or will negotiate with certain facilities, including the following:        Yes   Patient/family informed of bed offers received.  Patient chooses bed at [facility] [place]     Physician recommends and patient chooses bed at [facility] [place]    Patient to be transferred to [facility] [place] on 04/12/16.  Patient to be transferred to facility by PTAR     Patient family notified on 04/12/16 of transfer.  Name of family member notified:  Husband      PHYSICIAN Please prepare priority discharge summary,  including medications, Please sign DNR     Additional Comment:    _______________________________________________ Clearance CootsNicole A Dena Esperanza, LCSW 04/12/2016, 10:40 AM

## 2016-04-13 LAB — CULTURE, BLOOD (ROUTINE X 2)
CULTURE: NO GROWTH
Culture: NO GROWTH

## 2016-04-16 DIAGNOSIS — E440 Moderate protein-calorie malnutrition: Secondary | ICD-10-CM | POA: Diagnosis not present

## 2016-04-16 DIAGNOSIS — R413 Other amnesia: Secondary | ICD-10-CM | POA: Diagnosis not present

## 2016-04-16 DIAGNOSIS — K5909 Other constipation: Secondary | ICD-10-CM | POA: Diagnosis not present

## 2016-04-16 DIAGNOSIS — J449 Chronic obstructive pulmonary disease, unspecified: Secondary | ICD-10-CM | POA: Diagnosis not present

## 2016-04-16 DIAGNOSIS — R0609 Other forms of dyspnea: Secondary | ICD-10-CM | POA: Diagnosis not present

## 2016-04-16 DIAGNOSIS — R2689 Other abnormalities of gait and mobility: Secondary | ICD-10-CM | POA: Diagnosis not present

## 2016-04-16 DIAGNOSIS — F418 Other specified anxiety disorders: Secondary | ICD-10-CM | POA: Diagnosis not present

## 2016-04-16 DIAGNOSIS — E860 Dehydration: Secondary | ICD-10-CM | POA: Diagnosis not present

## 2016-04-30 DIAGNOSIS — R627 Adult failure to thrive: Secondary | ICD-10-CM | POA: Diagnosis not present

## 2016-05-05 ENCOUNTER — Ambulatory Visit: Payer: Medicare Other | Admitting: Emergency Medicine

## 2016-05-18 DIAGNOSIS — S42331D Displaced oblique fracture of shaft of humerus, right arm, subsequent encounter for fracture with routine healing: Secondary | ICD-10-CM | POA: Diagnosis not present

## 2016-05-24 ENCOUNTER — Ambulatory Visit (INDEPENDENT_AMBULATORY_CARE_PROVIDER_SITE_OTHER): Payer: Medicare Other | Admitting: Emergency Medicine

## 2016-05-24 ENCOUNTER — Encounter: Payer: Self-pay | Admitting: Emergency Medicine

## 2016-05-24 DIAGNOSIS — R911 Solitary pulmonary nodule: Secondary | ICD-10-CM | POA: Diagnosis not present

## 2016-05-24 DIAGNOSIS — J449 Chronic obstructive pulmonary disease, unspecified: Secondary | ICD-10-CM

## 2016-05-24 DIAGNOSIS — J939 Pneumothorax, unspecified: Secondary | ICD-10-CM | POA: Diagnosis not present

## 2016-05-24 NOTE — Assessment & Plan Note (Signed)
Recent acute exacerbation compounded by a traumatic left pneumothorax status post chest tube. She has recovered and is back home from rehabilitation. Continue her current Breo, Spiriva, albuterol. Continue current oxygen at 2 L/m. I confirmed her DNR/DNI status today.

## 2016-05-24 NOTE — Progress Notes (Signed)
Subjective:    Patient ID: Cheyenne [name], female    DOB: Jul 02, 1945, 71 y.o.   MRN: [id number]  HPI [age] yo smoker, former EtOH abuse, hx hypothroidism, DJD, anxiety. Was diagnosed clinically with "asthma" as an adult in 2000 in setting bronchospasm and the flu.    ROV 02/10/16 -- this follow-up visit for history of severe COPD with associated chronic hypoxemic respiratory failure. She was treated 11/13/15 with antibiotics and steroids for an acute exacerbation of her COPD. She returns today for regular maintenance visit.  She reports that for the last couple weeks she has been worse - more sinus fullness and congestion, associated with HA. Over the last 2-3 days much more cough, occas productive Thick phlegm, gray. Maybe some blood in it 2 weeks ago. She has been weak. She fell once since last visit, she feels that imbalance contributed. She has more wheeze, dyspnea. Her o2 is on 3L/min.        ROV 05/24/16 --  This follow-up visit for history of chronic hypoxemic respiratory failure in the setting of Gold D COPD. She reports that she unfortunately had a hospitalization for an acute exacerbation in early December, a fall and a L hydropneumothorax. She was able to get her chest tube out, discharge to [facility] for rehab. She has been home for a few days. She is on  Breo, Spiriva, albuterol prn - has needed rarely since the hospitalization. Oxygen at 2L/min. She is improved, is now close to her breathing baseline. She is coughing about once a day, has some mucous to clear.                                                                                           CAT Score 02/21/2013 04/28/2012  Total CAT Score 16 16   Gt  Review of Systems As per HPI    Objective:   Physical Exam Vitals:   05/24/16 1038  BP: 110/70  BP Location: Left Arm  Patient Position: Sitting  Cuff Size: Normal  Pulse: 90  SpO2: 91%  Weight: 93 lb 6.4 oz (42.4 kg)  Height: 5\' 1"  (1.549 m)   Gen: Pleasant, thin, some mild  resp distress, some difficulty completing a full sentence.   ENT: No lesions,  mouth clear,  oropharynx clear, no postnasal drip  Neck: No JVD, no TMG, no carotid bruits  Lungs: No use of accessory muscles, no wheezing  Cardiovascular: RRR, heart sounds normal, no murmur or gallops, no peripheral edema  Musculoskeletal: No deformities, no cyanosis or clubbing  Neuro: awake, she is a bit slower to respond today.   Skin: Warm, no lesions or rashes   04/07/16  --  COMPARISON:  03/29/2016, 05/12/2012  FINDINGS: Cardiovascular: Satisfactory opacification of the pulmonary arteries to the segmental level. No evidence of pulmonary embolism. Normal heart size. No pericardial effusion. Thoracic aortic atherosclerosis. Coronary artery atherosclerosis of the LAD, ramus intermedius and RCA.  Mediastinum/Nodes: No enlarged mediastinal, hilar, or axillary lymph nodes. Thyroid gland, trachea, and esophagus demonstrate no significant findings. Right lower lobe airspace disease.  Lungs/Pleura: No pneumothorax. Severe bilateral emphysematous changes most severe in the  left lower lobe. No pleural effusion. Right lower lobe airspace disease. 7 mm right lower lobe pulmonary nodule unchanged compared with 05/12/2012.  Upper Abdomen: No acute abnormality.  Musculoskeletal: No acute osseous abnormality. Chronic T7 and T8 vertebral body compression fractures. Degenerative disc disease throughout the thoracic spine. Small amount of soft tissue emphysema in the left lateral chest wall.  Review of the MIP images confirms the above findings.  IMPRESSION: 1. No evidence of pulmonary embolus. 2. Right lower lobe airspace disease concerning for pneumonia. 3.  Aortic Atherosclerosis (ICD10-170.0) 4.  Emphysema. (ICD10-J43.9   Assessment & Plan:  COPD  D  Recent acute exacerbation compounded by a traumatic left pneumothorax status post chest tube. She has recovered and is back home from  rehabilitation. Continue her current Breo, Spiriva, albuterol. Continue current oxygen at 2 L/m. I confirmed her DNR/DNI status today.  Pneumothorax on left Resolved on serial imaging while she was in the hospital. Do not believe there is an indication to repeat her imaging today.  Lung nodule Stable on serial CT scans. Likely benign.  Levy Pupa, MD, PhD 05/24/2016, 11:05 AM [facility] [phone] or if no answer [phone]

## 2016-05-24 NOTE — Assessment & Plan Note (Signed)
Resolved on serial imaging while she was in the hospital. Do not believe there is an indication to repeat her imaging today.

## 2016-05-24 NOTE — Assessment & Plan Note (Signed)
Stable on serial CT scans. Likely benign.

## 2016-05-24 NOTE — Patient Instructions (Signed)
Please continue your current medications as you are taking them.  Continue your oxygen at 2L/min Follow with Dr [name] in 3 months or sooner if you have any problems.

## 2016-05-26 ENCOUNTER — Telehealth: Payer: Self-pay | Admitting: Emergency Medicine

## 2016-05-26 NOTE — Telephone Encounter (Signed)
LMTCB for Cheyenne [name]  

## 2016-05-26 NOTE — Telephone Encounter (Signed)
Yes please arrange to have available while at home.

## 2016-05-26 NOTE — Telephone Encounter (Signed)
Pt is being discharged from [facility]. She will going back home. Pt's family would like to continue palliative care services.  RB - are you okay with continuing this for the pt? Thanks.

## 2016-05-27 NOTE — Telephone Encounter (Signed)
Spoke with PPL CorporationBetty. She is aware that RB is okay with continuing palliative care services. Nothing further was needed.

## 2016-05-28 DIAGNOSIS — R296 Repeated falls: Secondary | ICD-10-CM | POA: Diagnosis not present

## 2016-05-28 DIAGNOSIS — R634 Abnormal weight loss: Secondary | ICD-10-CM | POA: Diagnosis not present

## 2016-05-28 DIAGNOSIS — R627 Adult failure to thrive: Secondary | ICD-10-CM | POA: Diagnosis not present

## 2016-05-28 DIAGNOSIS — Z8709 Personal history of other diseases of the respiratory system: Secondary | ICD-10-CM | POA: Diagnosis not present

## 2016-05-28 DIAGNOSIS — F339 Major depressive disorder, recurrent, unspecified: Secondary | ICD-10-CM | POA: Diagnosis not present

## 2016-05-28 DIAGNOSIS — F419 Anxiety disorder, unspecified: Secondary | ICD-10-CM | POA: Diagnosis not present

## 2016-05-28 DIAGNOSIS — Z9981 Dependence on supplemental oxygen: Secondary | ICD-10-CM | POA: Diagnosis not present

## 2016-05-28 DIAGNOSIS — J449 Chronic obstructive pulmonary disease, unspecified: Secondary | ICD-10-CM | POA: Diagnosis not present

## 2016-05-31 DIAGNOSIS — M25511 Pain in right shoulder: Secondary | ICD-10-CM | POA: Diagnosis not present

## 2016-05-31 DIAGNOSIS — S42331D Displaced oblique fracture of shaft of humerus, right arm, subsequent encounter for fracture with routine healing: Secondary | ICD-10-CM | POA: Diagnosis not present

## 2016-05-31 DIAGNOSIS — M6281 Muscle weakness (generalized): Secondary | ICD-10-CM | POA: Diagnosis not present

## 2016-05-31 DIAGNOSIS — M25611 Stiffness of right shoulder, not elsewhere classified: Secondary | ICD-10-CM | POA: Diagnosis not present

## 2016-06-02 ENCOUNTER — Telehealth: Payer: Self-pay | Admitting: Emergency Medicine

## 2016-06-02 ENCOUNTER — Ambulatory Visit (INDEPENDENT_AMBULATORY_CARE_PROVIDER_SITE_OTHER)
Admission: RE | Admit: 2016-06-02 | Discharge: 2016-06-02 | Disposition: A | Discharge status: Home / Self Care | Payer: Medicare Other | Source: Ambulatory Visit | Attending: Adult Health | Admitting: Adult Health

## 2016-06-02 ENCOUNTER — Ambulatory Visit (INDEPENDENT_AMBULATORY_CARE_PROVIDER_SITE_OTHER): Payer: Medicare Other | Admitting: Adult Health

## 2016-06-02 ENCOUNTER — Encounter: Payer: Self-pay | Admitting: Adult Health

## 2016-06-02 VITALS — BP 106/58 | HR 103

## 2016-06-02 DIAGNOSIS — J449 Chronic obstructive pulmonary disease, unspecified: Secondary | ICD-10-CM

## 2016-06-02 DIAGNOSIS — J9692 Respiratory failure, unspecified with hypercapnia: Secondary | ICD-10-CM

## 2016-06-02 DIAGNOSIS — R0602 Shortness of breath: Secondary | ICD-10-CM | POA: Diagnosis not present

## 2016-06-02 MED ORDER — PREDNISONE 10 MG PO TABS: ORAL_TABLET | ORAL | 0 refills | Status: DC

## 2016-06-02 MED ORDER — AZITHROMYCIN 250 MG PO TABS: ORAL_TABLET | ORAL | 0 refills | Status: AC

## 2016-06-02 NOTE — Patient Instructions (Addendum)
Zpack take as directed. -take w/ food  Prednisone taper over next week.  Chest xray today .  Continue on Spiriva and Breo Continue on Oyxgen 2l/m  Follow-up with Dr. [name] in 2- 3 months and As needed   Please contact office for sooner follow up if symptoms do not improve or worsen or seek emergency care

## 2016-06-02 NOTE — Progress Notes (Signed)
@Patient  ID: Cheyenne [name], female    DOB: 04-23-1946, 71 y.o.   MRN: [id number]  Chief Complaint  Patient presents with  . Acute Visit    COPD     Referring provider: Juluis Rainier, MD  HPI: 71 yo female former smoker followed for severe COPD , O2 RF    06/02/2016 Acute OV  Pt presents for an acute office visit. Complains of 2-3 days of increased cough, congestion w/ thick green mucus and more dyspnea. Felt feverish and chilled yesterday . Appetite is fair w/ no n/v.d.  Weight is trending down . Lost 10 lb when in Nursing Home last month .  Remains on BREO and Spiriva.   Allergies  Allergen Reactions  . Penicillins Anaphylaxis    Has patient had a PCN reaction causing immediate rash, facial/tongue/throat swelling, SOB or lightheadedness with hypotension: yes Has patient had a PCN reaction causing severe rash involving mucus membranes or skin necrosis: unknown Has patient had a PCN reaction that required hospitalization: unknown Has patient had a PCN reaction occurring within the last 10 years: no If all of the above answers are "NO", then may proceed with Cephalosporin use. HAS TOLERATED AMPICILLIN SINCE RX AS CHILD   . Promethazine Hcl Diarrhea and Nausea And Vomiting    Immunization History  Administered Date(s) Administered  . Influenza Split 04/27/2011, 01/07/2012  . Influenza Whole 12/30/2009  . Influenza, High Dose Seasonal PF 01/25/2016  . Influenza,inj,Quad PF,36+ Mos 01/23/2014, 02/18/2015  . Pneumococcal Conjugate-13 12/25/2013    Past Medical History:  Diagnosis Date  . Alcohol abuse   . Anxiety   . Asthma   . COPD (chronic obstructive pulmonary disease) (HCC)   . Depression   . DJD (degenerative joint disease)   . Hypothyroidism   . Osteopenia   . Psoriatic arthritis (HCC) 06/02/2009  . Vitamin B12 deficiency     Tobacco History: History  Smoking Status  . Former Smoker  . Packs/day: 0.50  . Years: 40.00  . Types: Cigarettes  . Quit date:  04/26/2009  Smokeless Tobacco  . Never Used   Counseling given: Not Answered   Outpatient Encounter Prescriptions as of 06/02/2016  Medication Sig  . BREO ELLIPTA 100-25 MCG/INH AEPB INHALE ONE PUFF INTO THE LUNGS ONCE DAILY  . calcium carbonate (OS-CAL) 600 MG TABS Take 600 mg by mouth daily.    . feeding supplement (BOOST / RESOURCE BREEZE) LIQD Take 1 Container by mouth 3 (three) times daily between meals.  . fluticasone (FLONASE) 50 MCG/ACT nasal spray PLACE 2 SPRAYS INTO BOTH NOSTRILS DAILY (Patient taking differently: PLACE 2 SPRAYS INTO BOTH NOSTRILS twice daily as needed for allergies)  . glucosamine-chondroitin 500-400 MG tablet Take 1 tablet by mouth 2 (two) times daily.  Marland Kitchen HYDROcodone-acetaminophen (NORCO) 7.5-325 MG tablet Take 1 tablet by mouth 3 (three) times daily as needed.  Marland Kitchen levothyroxine (SYNTHROID, LEVOTHROID) 50 MCG tablet Take 50 mcg by mouth daily.    Marland Kitchen LORazepam (ATIVAN) 0.5 MG tablet Take 0.5 mg by mouth 2 (two) times daily as needed.  . methylcellulose (ARTIFICIAL TEARS) 1 % ophthalmic solution Place 1 drop into both eyes daily as needed (allergies/dry eyes).   . Multiple Vitamin (MULTIVITAMIN WITH MINERALS) TABS tablet Take 1 tablet by mouth daily.  . Omega-3 Fatty Acids (FISH OIL) 1000 MG CAPS Take 1 capsule by mouth every evening.   . OXYGEN 24/7- 2lpm with sleep and 2-4 with exertion  . PROAIR HFA 108 (90 Base) MCG/ACT inhaler INHALE UP  TO 2 PUFFS EVERY 4 HOURS AS NEEDED (Patient taking differently: INHALE UP TO 2 PUFFS EVERY 4 HOURS AS NEEDED for shortness of breath/wheezing)  . Respiratory Therapy Supplies (FLUTTER) DEVI Use as directed  . SPIRIVA HANDIHALER 18 MCG inhalation capsule USE ONE (1) CAPSULE INHALATION ONCE DAILY (Patient taking differently: USE ONE (1) CAPSULE INHALATION every evening)  . temazepam (RESTORIL) 15 MG capsule Take 15 mg by mouth at bedtime as needed for sleep.  . [DISCONTINUED] levofloxacin (LEVAQUIN) 500 MG tablet Take 1 tablet (500 mg  total) by mouth daily. (Patient not taking: Reported on 06/02/2016)   No facility-administered encounter medications on file as of 06/02/2016.      Review of Systems  Constitutional:   No  night sweats,  Fevers, chills +, fatigue, or  lassitude.  HEENT:   No headaches,  Difficulty swallowing,  Tooth/dental problems, or  Sore throat,                No sneezing, itching, ear ache, nasal congestion, post nasal drip,   CV:  No chest pain,  Orthopnea, PND, swelling in lower extremities, anasarca, dizziness, palpitations, syncope.   GI  No heartburn, indigestion, abdominal pain, nausea, vomiting, diarrhea, change in bowel habits, loss of appetite, bloody stools.   Resp:   No chest wall deformity  Skin: no rash or lesions.  GU: no dysuria, change in color of urine, no urgency or frequency.  No flank pain, no hematuria   MS:  No joint pain or swelling.  No decreased range of motion.  No back pain.    Physical Exam  BP (!) 106/58 (BP Location: Left Arm, Cuff Size: Normal)   Pulse (!) 103   SpO2 95%   GEN: A/Ox3; pleasant , NAD frail    HEENT:  [place]/AT,  EACs-clear, TMs-wnl, NOSE-clear, THROAT-clear, no lesions, no postnasal drip or exudate noted.   NECK:  Supple w/ fair ROM; no JVD; normal carotid impulses w/o bruits; no thyromegaly or nodules palpated; no lymphadenopathy.    RESP  Decreased BS in bases , . no accessory muscle use, no dullness to percussion  CARD:  RRR, no m/r/g, no peripheral edema, pulses intact, no cyanosis or clubbing.  GI:   Soft & nt; nml bowel sounds; no organomegaly or masses detected.   Musco: Warm bil, no deformities or joint swelling noted.   Neuro: alert, no focal deficits noted.    Skin: Warm, no lesions or rashes BNP No results found for: BNP  ProBNP No results found for: PROBNP  Imaging: No results found.   Assessment & Plan:   No problem-specific Assessment & Plan notes found for this encounter.     Cheyenne Oaksammy Marirose Deveney, NP 06/02/2016

## 2016-06-02 NOTE — Telephone Encounter (Signed)
Pt seen in the office today by TP for acute visit Pt reported that she recently had appt with her PCP [name]( BARNES,[name], MD ) and asked her for refills on her Lorazepam 0.5mg  and Temazepam 15mg  but Dr. Zachery DauerBarnes was hesitant to have patient continue these meds because they are controlled.  Pt would like to know if [facility] would write Rx's.  Dr. [name] please advise, thank you.

## 2016-06-04 MED ORDER — TEMAZEPAM 15 MG PO CAPS: 15.0000 mg | ORAL_CAPSULE | Freq: Every evening | ORAL | 5 refills | Status: DC | PRN

## 2016-06-04 MED ORDER — LORAZEPAM 0.5 MG PO TABS: 0.5000 mg | ORAL_TABLET | Freq: Two times a day (BID) | ORAL | 5 refills | Status: DC | PRN

## 2016-06-04 NOTE — Telephone Encounter (Signed)
Patient returned call.  Patient states please send RX's to [facility], phone [phone].   Patient's CB [phone] if need to speak with her.

## 2016-06-04 NOTE — Telephone Encounter (Signed)
Called and lmomtcb x 1 to make sure we have the correct pharmacy to call her meds to.

## 2016-06-04 NOTE — Telephone Encounter (Signed)
Yes I am Ok filling these prescriptions.

## 2016-06-04 NOTE — Telephone Encounter (Signed)
Spoke with pt. She is aware that we will be refilling her prescriptions. Rx have been called in. Nothing further was needed.

## 2016-06-07 NOTE — Progress Notes (Signed)
Spoke with patient and informed her of results and recommendations. Pt asked when her next appointment was and was told it was 08/23/16 at 230. Nothing further needed.

## 2016-06-08 DIAGNOSIS — M6281 Muscle weakness (generalized): Secondary | ICD-10-CM | POA: Diagnosis not present

## 2016-06-08 DIAGNOSIS — M25511 Pain in right shoulder: Secondary | ICD-10-CM | POA: Diagnosis not present

## 2016-06-08 DIAGNOSIS — M25611 Stiffness of right shoulder, not elsewhere classified: Secondary | ICD-10-CM | POA: Diagnosis not present

## 2016-06-08 DIAGNOSIS — S42331D Displaced oblique fracture of shaft of humerus, right arm, subsequent encounter for fracture with routine healing: Secondary | ICD-10-CM | POA: Diagnosis not present

## 2016-06-09 ENCOUNTER — Other Ambulatory Visit: Payer: Self-pay | Admitting: Adult Health

## 2016-06-09 DIAGNOSIS — J181 Lobar pneumonia, unspecified organism: Secondary | ICD-10-CM

## 2016-06-09 NOTE — Progress Notes (Signed)
Spoke with patient and informed her she needs to be seen sooner than previous discussed appointment. An appointment was scheduled for 3/6 at 215 with TP as Dr. [name] schedule was full. An order was placed for an chest xray. Nothing further is needed.

## 2016-06-10 DIAGNOSIS — S42331D Displaced oblique fracture of shaft of humerus, right arm, subsequent encounter for fracture with routine healing: Secondary | ICD-10-CM | POA: Diagnosis not present

## 2016-06-10 DIAGNOSIS — M6281 Muscle weakness (generalized): Secondary | ICD-10-CM | POA: Diagnosis not present

## 2016-06-10 DIAGNOSIS — M25611 Stiffness of right shoulder, not elsewhere classified: Secondary | ICD-10-CM | POA: Diagnosis not present

## 2016-06-10 DIAGNOSIS — M25511 Pain in right shoulder: Secondary | ICD-10-CM | POA: Diagnosis not present

## 2016-06-10 NOTE — Assessment & Plan Note (Signed)
Exacerbation   Plan  Patient Instructions  Zpack take as directed. -take w/ food  Prednisone taper over next week.  Chest xray today .  Continue on Spiriva and Breo Continue on Oyxgen 2l/m  Follow-up with Dr. [name] in 2- 3 months and As needed   Please contact office for sooner follow up if symptoms do not improve or worsen or seek emergency care

## 2016-06-10 NOTE — Assessment & Plan Note (Signed)
Cont on O2 .  

## 2016-06-15 DIAGNOSIS — M25611 Stiffness of right shoulder, not elsewhere classified: Secondary | ICD-10-CM | POA: Diagnosis not present

## 2016-06-15 DIAGNOSIS — S42331D Displaced oblique fracture of shaft of humerus, right arm, subsequent encounter for fracture with routine healing: Secondary | ICD-10-CM | POA: Diagnosis not present

## 2016-06-15 DIAGNOSIS — M6281 Muscle weakness (generalized): Secondary | ICD-10-CM | POA: Diagnosis not present

## 2016-06-15 DIAGNOSIS — M25511 Pain in right shoulder: Secondary | ICD-10-CM | POA: Diagnosis not present

## 2016-06-17 DIAGNOSIS — S42331D Displaced oblique fracture of shaft of humerus, right arm, subsequent encounter for fracture with routine healing: Secondary | ICD-10-CM | POA: Diagnosis not present

## 2016-06-17 DIAGNOSIS — M6281 Muscle weakness (generalized): Secondary | ICD-10-CM | POA: Diagnosis not present

## 2016-06-17 DIAGNOSIS — M25611 Stiffness of right shoulder, not elsewhere classified: Secondary | ICD-10-CM | POA: Diagnosis not present

## 2016-06-17 DIAGNOSIS — M25511 Pain in right shoulder: Secondary | ICD-10-CM | POA: Diagnosis not present

## 2016-06-22 ENCOUNTER — Other Ambulatory Visit: Payer: Self-pay | Admitting: Emergency Medicine

## 2016-06-24 DIAGNOSIS — M25511 Pain in right shoulder: Secondary | ICD-10-CM | POA: Diagnosis not present

## 2016-06-24 DIAGNOSIS — S42331D Displaced oblique fracture of shaft of humerus, right arm, subsequent encounter for fracture with routine healing: Secondary | ICD-10-CM | POA: Diagnosis not present

## 2016-06-24 DIAGNOSIS — M6281 Muscle weakness (generalized): Secondary | ICD-10-CM | POA: Diagnosis not present

## 2016-06-24 DIAGNOSIS — M25611 Stiffness of right shoulder, not elsewhere classified: Secondary | ICD-10-CM | POA: Diagnosis not present

## 2016-06-28 DIAGNOSIS — S42331D Displaced oblique fracture of shaft of humerus, right arm, subsequent encounter for fracture with routine healing: Secondary | ICD-10-CM | POA: Diagnosis not present

## 2016-06-29 ENCOUNTER — Ambulatory Visit (INDEPENDENT_AMBULATORY_CARE_PROVIDER_SITE_OTHER)
Admission: RE | Admit: 2016-06-29 | Discharge: 2016-06-29 | Disposition: A | Discharge status: Home / Self Care | Payer: Medicare Other | Source: Ambulatory Visit | Attending: Adult Health | Admitting: Adult Health

## 2016-06-29 ENCOUNTER — Ambulatory Visit (INDEPENDENT_AMBULATORY_CARE_PROVIDER_SITE_OTHER): Payer: Medicare Other | Admitting: Adult Health

## 2016-06-29 ENCOUNTER — Encounter: Payer: Self-pay | Admitting: Adult Health

## 2016-06-29 VITALS — BP 112/74 | HR 98

## 2016-06-29 DIAGNOSIS — J9612 Chronic respiratory failure with hypercapnia: Secondary | ICD-10-CM

## 2016-06-29 DIAGNOSIS — J189 Pneumonia, unspecified organism: Secondary | ICD-10-CM | POA: Insufficient documentation

## 2016-06-29 DIAGNOSIS — M6281 Muscle weakness (generalized): Secondary | ICD-10-CM | POA: Diagnosis not present

## 2016-06-29 DIAGNOSIS — J449 Chronic obstructive pulmonary disease, unspecified: Secondary | ICD-10-CM

## 2016-06-29 DIAGNOSIS — M25511 Pain in right shoulder: Secondary | ICD-10-CM | POA: Diagnosis not present

## 2016-06-29 DIAGNOSIS — M25611 Stiffness of right shoulder, not elsewhere classified: Secondary | ICD-10-CM | POA: Diagnosis not present

## 2016-06-29 DIAGNOSIS — S42331D Displaced oblique fracture of shaft of humerus, right arm, subsequent encounter for fracture with routine healing: Secondary | ICD-10-CM | POA: Diagnosis not present

## 2016-06-29 NOTE — Assessment & Plan Note (Signed)
Cont on O2 .  

## 2016-06-29 NOTE — Patient Instructions (Addendum)
Set up for CT chest w/o contrast. Continue on Spiriva and Breo Continue on Oyxgen 2l/m  Follow-up with Dr. [name] in 6 weeks and As needed   Please contact office for sooner follow up if symptoms do not improve or worsen or seek emergency care

## 2016-06-29 NOTE — Assessment & Plan Note (Signed)
Recent flare with ?RLL PNA   Plan  Patient Instructions  Set up for CT chest w/o contrast. Continue on Spiriva and Breo Continue on Oyxgen 2l/m  Follow-up with Dr. [name] in 6 weeks and As needed   Please contact office for sooner follow up if symptoms do not improve or worsen or seek emergency care

## 2016-06-29 NOTE — Progress Notes (Signed)
 @Patient  ID: Cheyenne [name], female    DOB: [birth year], 71 y.o.   MRN: [id number]  Chief Complaint  Patient presents with  . Follow-up    COPD Marliss Czar/PNA     Referring provider: Juluis RainierBarnes, [name], MD  HPI: 71 yo female former smoker followed for severe COPD , O2 RF   TEST  pfts 2/011   FEV1 0.79 (40%) ratio 33   06/29/2016 Follow up : COPD /PNA  Patient returns for a one-month follow-up. Patient was seen last visit with a COPD flare and suspected right lower lobe pneumonia. She was treated with a Z-Pak up. Patient returns today feeling some better , still has some sinus drainage and congestion . No fever, edema . Did see some blood tinged mucus after coughing really hard. No further episodes. Cough is decreased last few days.  X-ray shows persistent increased marking along RLL .  She remains on Spiriva and BREO .  Appetite is fair.  No n/d/v. No dysphagia   Remains on Oxygen 2l/m .     Allergies  Allergen Reactions  . Penicillins Anaphylaxis    Has patient had a PCN reaction causing immediate rash, facial/tongue/throat swelling, SOB or lightheadedness with hypotension: yes Has patient had a PCN reaction causing severe rash involving mucus membranes or skin necrosis: unknown Has patient had a PCN reaction that required hospitalization: unknown Has patient had a PCN reaction occurring within the last 10 years: no If all of the above answers are "NO", then may proceed with Cephalosporin use. HAS TOLERATED AMPICILLIN SINCE RX AS CHILD   . Promethazine Hcl Diarrhea and Nausea And Vomiting    Immunization History  Administered Date(s) Administered  . Influenza Split 04/27/2011, 01/07/2012  . Influenza Whole 12/30/2009  . Influenza, High Dose Seasonal PF 01/25/2016  . Influenza,inj,Quad PF,36+ Mos 01/23/2014, 02/18/2015  . Pneumococcal Conjugate-13 12/25/2013    Past Medical History:  Diagnosis Date  . Alcohol abuse   . Anxiety   . Asthma   . COPD (chronic obstructive  pulmonary disease) (HCC)   . Depression   . DJD (degenerative joint disease)   . Hypothyroidism   . Osteopenia   . Psoriatic arthritis (HCC) 06/02/2009  . Vitamin B12 deficiency     Tobacco History: History  Smoking Status  . Former Smoker  . Packs/day: 0.50  . Years: 40.00  . Types: Cigarettes  . Quit date: 04/26/2009  Smokeless Tobacco  . Never Used   Counseling given: Not Answered   Outpatient Encounter Prescriptions as of 06/29/2016  Medication Sig  . BREO ELLIPTA 100-25 MCG/INH AEPB INHALE ONE PUFF INTO THE LUNGS ONCE DAILY  . calcium carbonate (OS-CAL) 600 MG TABS Take 600 mg by mouth daily.    . feeding supplement (BOOST / RESOURCE BREEZE) LIQD Take 1 Container by mouth 3 (three) times daily between meals.  . fluticasone (FLONASE) 50 MCG/ACT nasal spray PLACE 2 SPRAYS INTO BOTH NOSTRILS DAILY  . glucosamine-chondroitin 500-400 MG tablet Take 1 tablet by mouth 2 (two) times daily.  Marland Kitchen. HYDROcodone-acetaminophen (NORCO) 7.5-325 MG tablet Take 1 tablet by mouth 3 (three) times daily as needed.  Marland Kitchen. levothyroxine (SYNTHROID, LEVOTHROID) 50 MCG tablet Take 50 mcg by mouth daily.    Marland Kitchen. LORazepam (ATIVAN) 0.5 MG tablet Take 1 tablet (0.5 mg total) by mouth 2 (two) times daily as needed.  . methylcellulose (ARTIFICIAL TEARS) 1 % ophthalmic solution Place 1 drop into both eyes daily as needed (allergies/dry eyes).   . Multiple Vitamin (MULTIVITAMIN WITH MINERALS)  TABS tablet Take 1 tablet by mouth daily.  . Omega-3 Fatty Acids (FISH OIL) 1000 MG CAPS Take 1 capsule by mouth every evening.   . OXYGEN 24/7- 2lpm with sleep and 2-4 with exertion  . PROAIR HFA 108 (90 Base) MCG/ACT inhaler INHALE UP TO 2 PUFFS EVERY 4 HOURS AS NEEDED (Patient taking differently: INHALE UP TO 2 PUFFS EVERY 4 HOURS AS NEEDED for shortness of breath/wheezing)  . Respiratory Therapy Supplies (FLUTTER) DEVI Use as directed  . SPIRIVA HANDIHALER 18 MCG inhalation capsule USE ONE (1) CAPSULE INHALATION ONCE DAILY  (Patient taking differently: USE ONE (1) CAPSULE INHALATION every evening)  . temazepam (RESTORIL) 15 MG capsule Take 1 capsule (15 mg total) by mouth at bedtime as needed for sleep.  . [DISCONTINUED] predniSONE (DELTASONE) 10 MG tablet 4 tabs for 2 days, then 3 tabs for 2 days, 2 tabs for 2 days, then 1 tab for 2 days, then stop (Patient not taking: Reported on 06/29/2016)   No facility-administered encounter medications on file as of 06/29/2016.      Review of Systems  Constitutional:   No  weight loss, night sweats,  Fevers, chills, fatigue, or  lassitude.  HEENT:   No headaches,  Difficulty swallowing,  Tooth/dental problems, or  Sore throat,                No sneezing, itching, ear ache, + nasal congestion, post nasal drip,   CV:  No chest pain,  Orthopnea, PND, swelling in lower extremities, anasarca, dizziness, palpitations, syncope.   GI  No heartburn, indigestion, abdominal pain, nausea, vomiting, diarrhea, change in bowel habits, loss of appetite, bloody stools.   Resp:    No chest wall deformity  Skin: no rash or lesions.  GU: no dysuria, change in color of urine, no urgency or frequency.  No flank pain, no hematuria   MS:  No joint pain or swelling.  No decreased range of motion.  No back pain.    Physical Exam  Pulse 98   SpO2 95%   GEN: A/Ox3; pleasant , NAD, thin and frail , in wc on O2    HEENT:  [place]/AT,  EACs-clear, TMs-wnl, NOSE-clear, THROAT-clear, no lesions, no postnasal drip or exudate noted.   NECK:  Supple w/ fair ROM; no JVD; normal carotid impulses w/o bruits; no thyromegaly or nodules palpated; no lymphadenopathy.    RESP  Decreased BS in bases , no accessory muscle use, no dullness to percussion  CARD:  RRR, no m/r/g, no peripheral edema, pulses intact, no cyanosis or clubbing.  GI:   Soft & nt; nml bowel sounds; no organomegaly or masses detected.   Musco: Warm bil, no deformities or joint swelling noted.   Neuro: alert, no focal deficits  noted.    Skin: Warm, no lesions or rashes    Lab Results:  BNP No results found for: BNP  ProBNP No results found for: PROBNP  Imaging: Dg Chest 2 View  Result Date: 06/29/2016 CLINICAL DATA:  Pneumonia right lung due to infectious organism, follow-up EXAM: CHEST  2 VIEW COMPARISON:  06/02/2016, chest CT 04/07/2016 FINDINGS: COPD and emphysema. Lungs are hyperinflated and hyperlucent diffusely. Increased lung markings in the right lung base. Right medial lung base density unchanged. New area of platelike atelectasis or infiltrate in the right lung base above the diaphragm. Review of the recent CT chest reveals a small area of infiltrate in the right lung base. This appears to have progressed in the interval. Right  upper lobe calcified nodule unchanged. Negative for pleural effusion. Chronic compression fracture in the mid thoracic spine unchanged. IMPRESSION: Advanced chronic lung disease with emphysema and hyperinflation. Progression of right lower lobe density. This may be due to recurrent or residual pneumonia, possibly with superimposed atelectasis. No mass lesion was seen in this area on the recent CT of 04/25/2016. Correlate with persistent chest symptoms. Follow-up chest x-ray and/or chest CT is suggested in 3-4 weeks. Electronically Signed   By: Marlan Palau M.D.   On: 06/29/2016 14:41   Dg Chest 2 View  Result Date: 06/02/2016 CLINICAL DATA:  Fever with shortness of breath and congestion EXAM: CHEST  2 VIEW COMPARISON:  Chest radiograph March 29, 2016; chest CT April 07, 2016 FINDINGS: There is underlying emphysematous change. There is extensive bullous disease throughout much of the left mid lower lung zones with diminished vascularity in these areas. There are areas of scarring on the right. There is a focal area of opacity in posteromedial right base, suspicious for pneumonia. The heart size is within normal limits. The pulmonary vascularity reflects underlying emphysema. There  is atherosclerotic calcification in the aorta. No adenopathy. Anterior wedging of the T8 vertebral body appears stable. IMPRESSION: Underlying emphysema. Opacity posteromedial right base, likely pneumonia. Areas of scarring on the right. Aortic atherosclerosis. Stable anterior wedging T8 vertebral body. Followup PA and lateral chest radiographs recommended in 3-4 weeks following trial of antibiotic therapy to ensure resolution and exclude underlying malignancy. These results will be called to the ordering clinician or representative by the Radiologist Assistant, and communication documented in the PACS or zVision Dashboard. Electronically Signed   By: Bretta Bang III M.D.   On: 06/02/2016 14:14     Assessment & Plan:   COPD  D  Recent flare with ?RLL PNA   Plan  Patient Instructions  Set up for CT chest w/o contrast. Continue on Spiriva and Breo Continue on Oyxgen 2l/m  Follow-up with Dr. [name] in 6 weeks and As needed   Please contact office for sooner follow up if symptoms do not improve or worsen or seek emergency care      Respiratory failure with hypercapnia (HCC) Cont on O2   Pneumonia Lingering area along RLL since DEC ? Acute vs chronic  Check CT chest w/o contrast.      Rubye Oaks, NP 06/29/2016

## 2016-06-29 NOTE — Assessment & Plan Note (Signed)
Lingering area along RLL since DEC ? Acute vs chronic  Check CT chest w/o contrast.

## 2016-07-01 DIAGNOSIS — M6281 Muscle weakness (generalized): Secondary | ICD-10-CM | POA: Diagnosis not present

## 2016-07-01 DIAGNOSIS — M25511 Pain in right shoulder: Secondary | ICD-10-CM | POA: Diagnosis not present

## 2016-07-01 DIAGNOSIS — M25611 Stiffness of right shoulder, not elsewhere classified: Secondary | ICD-10-CM | POA: Diagnosis not present

## 2016-07-01 DIAGNOSIS — S42331D Displaced oblique fracture of shaft of humerus, right arm, subsequent encounter for fracture with routine healing: Secondary | ICD-10-CM | POA: Diagnosis not present

## 2016-07-07 DIAGNOSIS — M5441 Lumbago with sciatica, right side: Secondary | ICD-10-CM | POA: Diagnosis not present

## 2016-07-07 DIAGNOSIS — G8929 Other chronic pain: Secondary | ICD-10-CM | POA: Diagnosis not present

## 2016-07-07 DIAGNOSIS — M5442 Lumbago with sciatica, left side: Secondary | ICD-10-CM | POA: Diagnosis not present

## 2016-07-09 ENCOUNTER — Telehealth: Payer: Self-pay | Admitting: Emergency Medicine

## 2016-07-09 NOTE — Telephone Encounter (Signed)
Spoke with Thayer Ohmhris at [facility], states pt arrived for a PT visit today on 2lpm and her 02 was at 71%- Thayer OhmChris notes that pt has cold hands and poor circulation, had to use several machines to get a reading on patient.  After bumping 02 to 3lpm at rest 02 readings came up to 89%.  I advised Thayer OhmChris that her 4702 rx is for her to wear 3-4lpm with exertion.  Thayer OhmChris expressed understanding, increased 02 to 4lpm.  Pt will not undergo any exertional therapy today.  Per Thayer Ohmhris, pt denied any breathing complaints (no SOB, dizziness, cough, chest tightness, no fever in clinic).  Thayer OhmChris states that pt has a more accurate pulse ox at home, will have her check readings at home and contact our office if her sats do not rise or if she develops any breathing complaints.    Will close encounter.

## 2016-07-12 DIAGNOSIS — J449 Chronic obstructive pulmonary disease, unspecified: Secondary | ICD-10-CM | POA: Diagnosis not present

## 2016-07-12 DIAGNOSIS — Z9981 Dependence on supplemental oxygen: Secondary | ICD-10-CM | POA: Diagnosis not present

## 2016-07-12 DIAGNOSIS — D510 Vitamin B12 deficiency anemia due to intrinsic factor deficiency: Secondary | ICD-10-CM | POA: Diagnosis not present

## 2016-07-13 DIAGNOSIS — M6281 Muscle weakness (generalized): Secondary | ICD-10-CM | POA: Diagnosis not present

## 2016-07-13 DIAGNOSIS — M25511 Pain in right shoulder: Secondary | ICD-10-CM | POA: Diagnosis not present

## 2016-07-13 DIAGNOSIS — M25611 Stiffness of right shoulder, not elsewhere classified: Secondary | ICD-10-CM | POA: Diagnosis not present

## 2016-07-13 DIAGNOSIS — S42331D Displaced oblique fracture of shaft of humerus, right arm, subsequent encounter for fracture with routine healing: Secondary | ICD-10-CM | POA: Diagnosis not present

## 2016-07-15 DIAGNOSIS — M25611 Stiffness of right shoulder, not elsewhere classified: Secondary | ICD-10-CM | POA: Diagnosis not present

## 2016-07-15 DIAGNOSIS — S42331D Displaced oblique fracture of shaft of humerus, right arm, subsequent encounter for fracture with routine healing: Secondary | ICD-10-CM | POA: Diagnosis not present

## 2016-07-15 DIAGNOSIS — M25511 Pain in right shoulder: Secondary | ICD-10-CM | POA: Diagnosis not present

## 2016-07-15 DIAGNOSIS — M6281 Muscle weakness (generalized): Secondary | ICD-10-CM | POA: Diagnosis not present

## 2016-07-20 ENCOUNTER — Ambulatory Visit (INDEPENDENT_AMBULATORY_CARE_PROVIDER_SITE_OTHER)
Admission: RE | Admit: 2016-07-20 | Discharge: 2016-07-20 | Disposition: A | Discharge status: Home / Self Care | Payer: Medicare Other | Source: Ambulatory Visit | Attending: Adult Health | Admitting: Adult Health

## 2016-07-20 DIAGNOSIS — J449 Chronic obstructive pulmonary disease, unspecified: Secondary | ICD-10-CM | POA: Diagnosis not present

## 2016-07-20 DIAGNOSIS — S42331D Displaced oblique fracture of shaft of humerus, right arm, subsequent encounter for fracture with routine healing: Secondary | ICD-10-CM | POA: Diagnosis not present

## 2016-07-20 DIAGNOSIS — M6281 Muscle weakness (generalized): Secondary | ICD-10-CM | POA: Diagnosis not present

## 2016-07-20 DIAGNOSIS — J189 Pneumonia, unspecified organism: Secondary | ICD-10-CM | POA: Diagnosis not present

## 2016-07-20 DIAGNOSIS — M25611 Stiffness of right shoulder, not elsewhere classified: Secondary | ICD-10-CM | POA: Diagnosis not present

## 2016-07-20 DIAGNOSIS — M25511 Pain in right shoulder: Secondary | ICD-10-CM | POA: Diagnosis not present

## 2016-07-20 NOTE — Progress Notes (Signed)
LMOMTCB x 1 

## 2016-07-21 ENCOUNTER — Encounter: Payer: Self-pay | Admitting: Emergency Medicine

## 2016-07-21 ENCOUNTER — Telehealth: Payer: Self-pay | Admitting: Emergency Medicine

## 2016-07-21 ENCOUNTER — Ambulatory Visit (INDEPENDENT_AMBULATORY_CARE_PROVIDER_SITE_OTHER): Payer: Medicare Other | Admitting: Emergency Medicine

## 2016-07-21 DIAGNOSIS — R918 Other nonspecific abnormal finding of lung field: Secondary | ICD-10-CM

## 2016-07-21 DIAGNOSIS — J301 Allergic rhinitis due to pollen: Secondary | ICD-10-CM

## 2016-07-21 DIAGNOSIS — J189 Pneumonia, unspecified organism: Secondary | ICD-10-CM

## 2016-07-21 DIAGNOSIS — J449 Chronic obstructive pulmonary disease, unspecified: Secondary | ICD-10-CM | POA: Diagnosis not present

## 2016-07-21 NOTE — Progress Notes (Signed)
Patient returned call.  Advised of CT results / recs as stated by TP.  Pt verbalized understanding and denied any questions.  Appt scheduled with RB this afternoon at 2pm.

## 2016-07-21 NOTE — Assessment & Plan Note (Signed)
RLL PNA with residual infiltrate on CT from 3/27. Hopefull she will continue to clear the airs[pace disease. Will repeat her CXR in April to assess.

## 2016-07-21 NOTE — Progress Notes (Signed)
Subjective:    Patient ID: Cheyenne [name], female    DOB: 16-Sep-1945, 71 y.o.   MRN: [id number]  HPI 71 yo smoker, former EtOH abuse, hx hypothroidism, DJD, anxiety. Was diagnosed clinically with "asthma" as an adult in 2000 in setting bronchospasm and the flu.    ROV 02/10/16 -- this follow-up visit for history of severe COPD with associated chronic hypoxemic respiratory failure. She was treated 11/13/15 with antibiotics and steroids for an acute exacerbation of her COPD. She returns today for regular maintenance visit.  She reports that for the last couple weeks she has been worse - more sinus fullness and congestion, associated with HA. Over the last 2-3 days much more cough, occas productive Thick phlegm, gray. Maybe some blood in it 2 weeks ago. She has been weak. She fell once since last visit, she feels that imbalance contributed. She has more wheeze, dyspnea. Her o2 is on 3L/min.        ROV 05/24/16 --  This follow-up visit for history of chronic hypoxemic respiratory failure in the setting of Gold D COPD. She reports that she unfortunately had a hospitalization for an acute exacerbation in early December, a fall and a L hydropneumothorax. She was able to get her chest tube out, discharge to [facility] for rehab. She has been home for a few days. She is on  Breo, Spiriva, albuterol prn - has needed rarely since the hospitalization. Oxygen at 2L/min. She is improved, is now close to her breathing baseline. She is coughing about once a day, has some mucous to clear.          ROV 07/21/16 --           Patient has a history of severe COPD with associated hypoxemic respiratory failure. Several months difficulty. She had a fall with a left hydropneumothorax and chest tube as detailed above. She has also experienced R lower lobe pneumonia with airspace disease on CT scan of the chest. This was treated with anabiotic's. CXR with a RLL infiltrate. She underwent a repeat CT scan yesterday that I have personally  reviewed. Shows some RLL medial infiltrate, two nodules of unclear significance on the R. She finished abx and pred, treated in Feb March. She is still having some weakness, fatigue. She has not been coughing until last week, productive of thick mucous.   She is on flonase, spiriva + breo, she uses albuterol daily. Her O2 is at 2-3L/min. She is doing PT.    No flowsheet data found. Gt  Review of Systems As per HPI    Objective:   Physical Exam Vitals:   07/21/16 1401  BP: (!) 92/58  Pulse: 94  SpO2: 91%  Weight: 97 lb (44 kg)  Height: 5\' 1"  (1.549 m)   Gen: Pleasant, thin, some mild resp distress, some difficulty completing a full sentence.   ENT: No lesions,  mouth clear,  oropharynx clear, no postnasal drip  Neck: No JVD, no TMG, no carotid bruits  Lungs: , No accessory muscle use, some right nasal or inspiratory crackles, distant throughout, no wheezing  Cardiovascular: RRR, heart sounds normal, no murmur or gallops, no peripheral edema  Musculoskeletal: No deformities, no cyanosis or clubbing  Neuro: awake, she is a bit slower to respond today.   Skin: Warm, no lesions or rashes   04/07/16  --  COMPARISON:  03/29/2016, 05/12/2012  FINDINGS: Cardiovascular: Satisfactory opacification of the pulmonary arteries to the segmental level. No evidence of pulmonary embolism. Normal heart size.  No pericardial effusion. Thoracic aortic atherosclerosis. Coronary artery atherosclerosis of the LAD, ramus intermedius and RCA.  Mediastinum/Nodes: No enlarged mediastinal, hilar, or axillary lymph nodes. Thyroid gland, trachea, and esophagus demonstrate no significant findings. Right lower lobe airspace disease.  Lungs/Pleura: No pneumothorax. Severe bilateral emphysematous changes most severe in the left lower lobe. No pleural effusion. Right lower lobe airspace disease. 7 mm right lower lobe pulmonary nodule unchanged compared with 05/12/2012.  Upper Abdomen: No acute  abnormality.  Musculoskeletal: No acute osseous abnormality. Chronic T7 and T8 vertebral body compression fractures. Degenerative disc disease throughout the thoracic spine. Small amount of soft tissue emphysema in the left lateral chest wall.  Review of the MIP images confirms the above findings.  IMPRESSION: 1. No evidence of pulmonary embolus. 2. Right lower lobe airspace disease concerning for pneumonia. 3.  Aortic Atherosclerosis (ICD10-170.0) 4.  Emphysema. (ICD10-J43.9   Assessment & Plan:  Allergic rhinitis Continue current therapy  COPD  D  continue current Breo and spiriva, albuterol prn She is not back to baseline post-RLL PNA. I broached the subject of daily pred w her today. This may be needed if she does not improve significantly. f/u in April w a CXR. Revisit then depending on her daily sx  Pneumonia RLL PNA with residual infiltrate on CT from 3/27. Hopefull she will continue to clear the airs[pace disease. Will repeat her CXR in April to assess.   Pulmonary nodules Will follow these conservatively, next w a CXR. Then decide regarding timing of repeat Ct scan this Spring  Levy Pupaobert Providencia Hottenstein, MD, PhD 07/21/2016, 2:33 PM [facility] [phone] or if no answer [phone]

## 2016-07-21 NOTE — Assessment & Plan Note (Signed)
continue current Breo and spiriva, albuterol prn She is not back to baseline post-RLL PNA. I broached the subject of daily pred w her today. This may be needed if she does not improve significantly. f/u in April w a CXR. Revisit then depending on her daily sx

## 2016-07-21 NOTE — Assessment & Plan Note (Signed)
Will follow these conservatively, next w a CXR. Then decide regarding timing of repeat Ct scan this Spring

## 2016-07-21 NOTE — Patient Instructions (Signed)
Please continue your Breo and Spiriva as you are taking them Take albuterol 2 puffs up to every 4 hours if needed for shortness of breath.  continue your oxygen at all times We will follow up in April as already scheduled. We will perform a repeat CXR that day

## 2016-07-21 NOTE — Telephone Encounter (Signed)
See 3.27.18 CT Chest result note Will sign off

## 2016-07-21 NOTE — Assessment & Plan Note (Signed)
Continue current therapy 

## 2016-07-27 ENCOUNTER — Other Ambulatory Visit: Payer: Self-pay | Admitting: Emergency Medicine

## 2016-07-29 DIAGNOSIS — M25511 Pain in right shoulder: Secondary | ICD-10-CM | POA: Diagnosis not present

## 2016-07-29 DIAGNOSIS — M25611 Stiffness of right shoulder, not elsewhere classified: Secondary | ICD-10-CM | POA: Diagnosis not present

## 2016-07-29 DIAGNOSIS — S42331D Displaced oblique fracture of shaft of humerus, right arm, subsequent encounter for fracture with routine healing: Secondary | ICD-10-CM | POA: Diagnosis not present

## 2016-07-29 DIAGNOSIS — M6281 Muscle weakness (generalized): Secondary | ICD-10-CM | POA: Diagnosis not present

## 2016-08-12 DIAGNOSIS — H6521 Chronic serous otitis media, right ear: Secondary | ICD-10-CM | POA: Diagnosis not present

## 2016-08-23 ENCOUNTER — Ambulatory Visit (INDEPENDENT_AMBULATORY_CARE_PROVIDER_SITE_OTHER): Payer: Medicare Other | Admitting: Emergency Medicine

## 2016-08-23 ENCOUNTER — Ambulatory Visit (INDEPENDENT_AMBULATORY_CARE_PROVIDER_SITE_OTHER)
Admission: RE | Admit: 2016-08-23 | Discharge: 2016-08-23 | Disposition: A | Discharge status: Home / Self Care | Payer: Medicare Other | Source: Ambulatory Visit | Attending: Emergency Medicine | Admitting: Emergency Medicine

## 2016-08-23 ENCOUNTER — Encounter: Payer: Self-pay | Admitting: Emergency Medicine

## 2016-08-23 VITALS — BP 110/60 | HR 77 | Ht 61.0 in | Wt 97.0 lb

## 2016-08-23 DIAGNOSIS — J449 Chronic obstructive pulmonary disease, unspecified: Secondary | ICD-10-CM

## 2016-08-23 DIAGNOSIS — J189 Pneumonia, unspecified organism: Secondary | ICD-10-CM

## 2016-08-23 DIAGNOSIS — J9611 Chronic respiratory failure with hypoxia: Secondary | ICD-10-CM

## 2016-08-23 DIAGNOSIS — J301 Allergic rhinitis due to pollen: Secondary | ICD-10-CM | POA: Diagnosis not present

## 2016-08-23 NOTE — Progress Notes (Signed)
Subjective:    Patient ID: Cheyenne [name], female    DOB: [birth year], 71 y.o.   MRN: [id number]  HPI 71 yo smoker, former EtOH abuse, hx hypothroidism, DJD, anxiety. Was diagnosed clinically with "asthma" as an adult in 2000 in setting bronchospasm and the flu.    ROV 02/10/16 -- this follow-up visit for history of severe COPD with associated chronic hypoxemic respiratory failure. She was treated 11/13/15 with antibiotics and steroids for an acute exacerbation of her COPD. She returns today for regular maintenance visit.  She reports that for the last couple weeks she has been worse - more sinus fullness and congestion, associated with HA. Over the last 2-3 days much more cough, occas productive Thick phlegm, gray. Maybe some blood in it 2 weeks ago. She has been weak. She fell once since last visit, she feels that imbalance contributed. She has more wheeze, dyspnea. Her o2 is on 3L/min.        ROV 05/24/16 --  This follow-up visit for history of chronic hypoxemic respiratory failure in the setting of Gold D COPD. She reports that she unfortunately had a hospitalization for an acute exacerbation in early December, a fall and a L hydropneumothorax. She was able to get her chest tube out, discharge to [facility] for rehab. She has been home for a few days. She is on  Breo, Spiriva, albuterol prn - has needed rarely since the hospitalization. Oxygen at 2L/min. She is improved, is now close to her breathing baseline. She is coughing about once a day, has some mucous to clear.          ROV 07/21/16 --           Patient has a history of severe COPD with associated hypoxemic respiratory failure. Several months difficulty. She had a fall with a left hydropneumothorax and chest tube as detailed above. She has also experienced R lower lobe pneumonia with airspace disease on CT scan of the chest. This was treated with anabiotic's. CXR with a RLL infiltrate. She underwent a repeat CT scan yesterday that I have personally  reviewed. Shows some RLL medial infiltrate, two nodules of unclear significance on the R. She finished abx and pred, treated in Feb March. She is still having some weakness, fatigue. She has not been coughing until last week, productive of thick mucous.   She is on flonase, spiriva + breo, she uses albuterol daily. Her O2 is at 2-3L/min. She is doing PT.   ROV 08/23/16 -- this follow-up visit for patient with a history of severe COPD, chronic hypoxemic respiratory failure. Recent traumatic left hydropneumothorax as detailed above. She reports that she has some R sided insp pain, more in the evening or night. She continues to be limited w exertion. She uses albuterol about once every 2 days, for exertional SOB or wheeze. Minimal cough. She is on 2-3L/min at all times. No flares since last time.   No flowsheet data found. Gt  Review of Systems As per HPI    Objective:   Physical Exam Vitals:   08/23/16 1441 08/23/16 1446  BP: 110/60   Pulse: 66 77  SpO2: (!) 85% 95%  Weight: 97 lb (44 kg)   Height:  (1.549 m)    Gen: Pleasant, thin, some mild resp distress, some difficulty completing a full sentence.   ENT: No lesions,  mouth clear,  oropharynx clear, no postnasal drip  Neck: No JVD, no TMG, no carotid bruits  Lungs: , No accessory  muscle use, some right nasal or inspiratory crackles, distant throughout, no wheezing  Cardiovascular: RRR, heart sounds normal, no murmur or gallops, no peripheral edema  Musculoskeletal: No deformities, no cyanosis or clubbing  Neuro: awake, she is a bit slower to respond today.   Skin: Warm, no lesions or rashes   04/07/16  --  COMPARISON:  03/29/2016, 05/12/2012  FINDINGS: Cardiovascular: Satisfactory opacification of the pulmonary arteries to the segmental level. No evidence of pulmonary embolism. Normal heart size. No pericardial effusion. Thoracic aortic atherosclerosis. Coronary artery atherosclerosis of the LAD, ramus intermedius and  RCA.  Mediastinum/Nodes: No enlarged mediastinal, hilar, or axillary lymph nodes. Thyroid gland, trachea, and esophagus demonstrate no significant findings. Right lower lobe airspace disease.  Lungs/Pleura: No pneumothorax. Severe bilateral emphysematous changes most severe in the left lower lobe. No pleural effusion. Right lower lobe airspace disease. 7 mm right lower lobe pulmonary nodule unchanged compared with 05/12/2012.  Upper Abdomen: No acute abnormality.  Musculoskeletal: No acute osseous abnormality. Chronic T7 and T8 vertebral body compression fractures. Degenerative disc disease throughout the thoracic spine. Small amount of soft tissue emphysema in the left lateral chest wall.  Review of the MIP images confirms the above findings.  IMPRESSION: 1. No evidence of pulmonary embolus. 2. Right lower lobe airspace disease concerning for pneumonia. 3.  Aortic Atherosclerosis (ICD10-170.0) 4.  Emphysema. (ICD10-J43.9   Assessment & Plan:  Pneumonia Chest x-ray today confirms that she has some slight residual right middle lobe scar but that this is for the most part resolved. There may be a small area of right pleural tenting.   COPD  D  Continue same regimen, clinically stable at this time.   Allergic rhinitis Continue fluticasone nasal spray.   Chronic respiratory failure (HCC) With associated hypoxemia. Continue oxygen at 2-3 L/m depending on level of exertion.  Levy Pupa, MD, PhD 08/23/2016, 3:16 PM [facility] [phone] or if no answer [phone]

## 2016-08-23 NOTE — Assessment & Plan Note (Signed)
With associated hypoxemia. Continue oxygen at 2-3 L/m depending on level of exertion.

## 2016-08-23 NOTE — Patient Instructions (Addendum)
Please continue your inhaled medications as you have been taking them  Continue your oxygen at 2-3L/min as you are doing.  Continue flonase nasal spray as you are taking it.  Follow with Dr [name] in 3 months or sooner if you have any problems.

## 2016-08-23 NOTE — Assessment & Plan Note (Signed)
Chest x-ray today confirms that she has some slight residual right middle lobe scar but that this is for the most part resolved. There may be a small area of right pleural tenting.

## 2016-08-23 NOTE — Assessment & Plan Note (Signed)
Continue same regimen, clinically stable at this time.

## 2016-08-23 NOTE — Assessment & Plan Note (Signed)
Continue fluticasone nasal spray.

## 2016-08-30 ENCOUNTER — Other Ambulatory Visit: Payer: Self-pay | Admitting: Emergency Medicine

## 2016-10-21 IMAGING — DX DG CHEST 2V
2 series · 2 of 2 positions shown · non-contrast
Comparison: Chest x-ray of 11/13/2015

CLINICAL DATA: Cough, low-grade fever, substernal chest pain for 2
weeks, former smoking history

EXAM:
CHEST  2 VIEW

[chest pa]
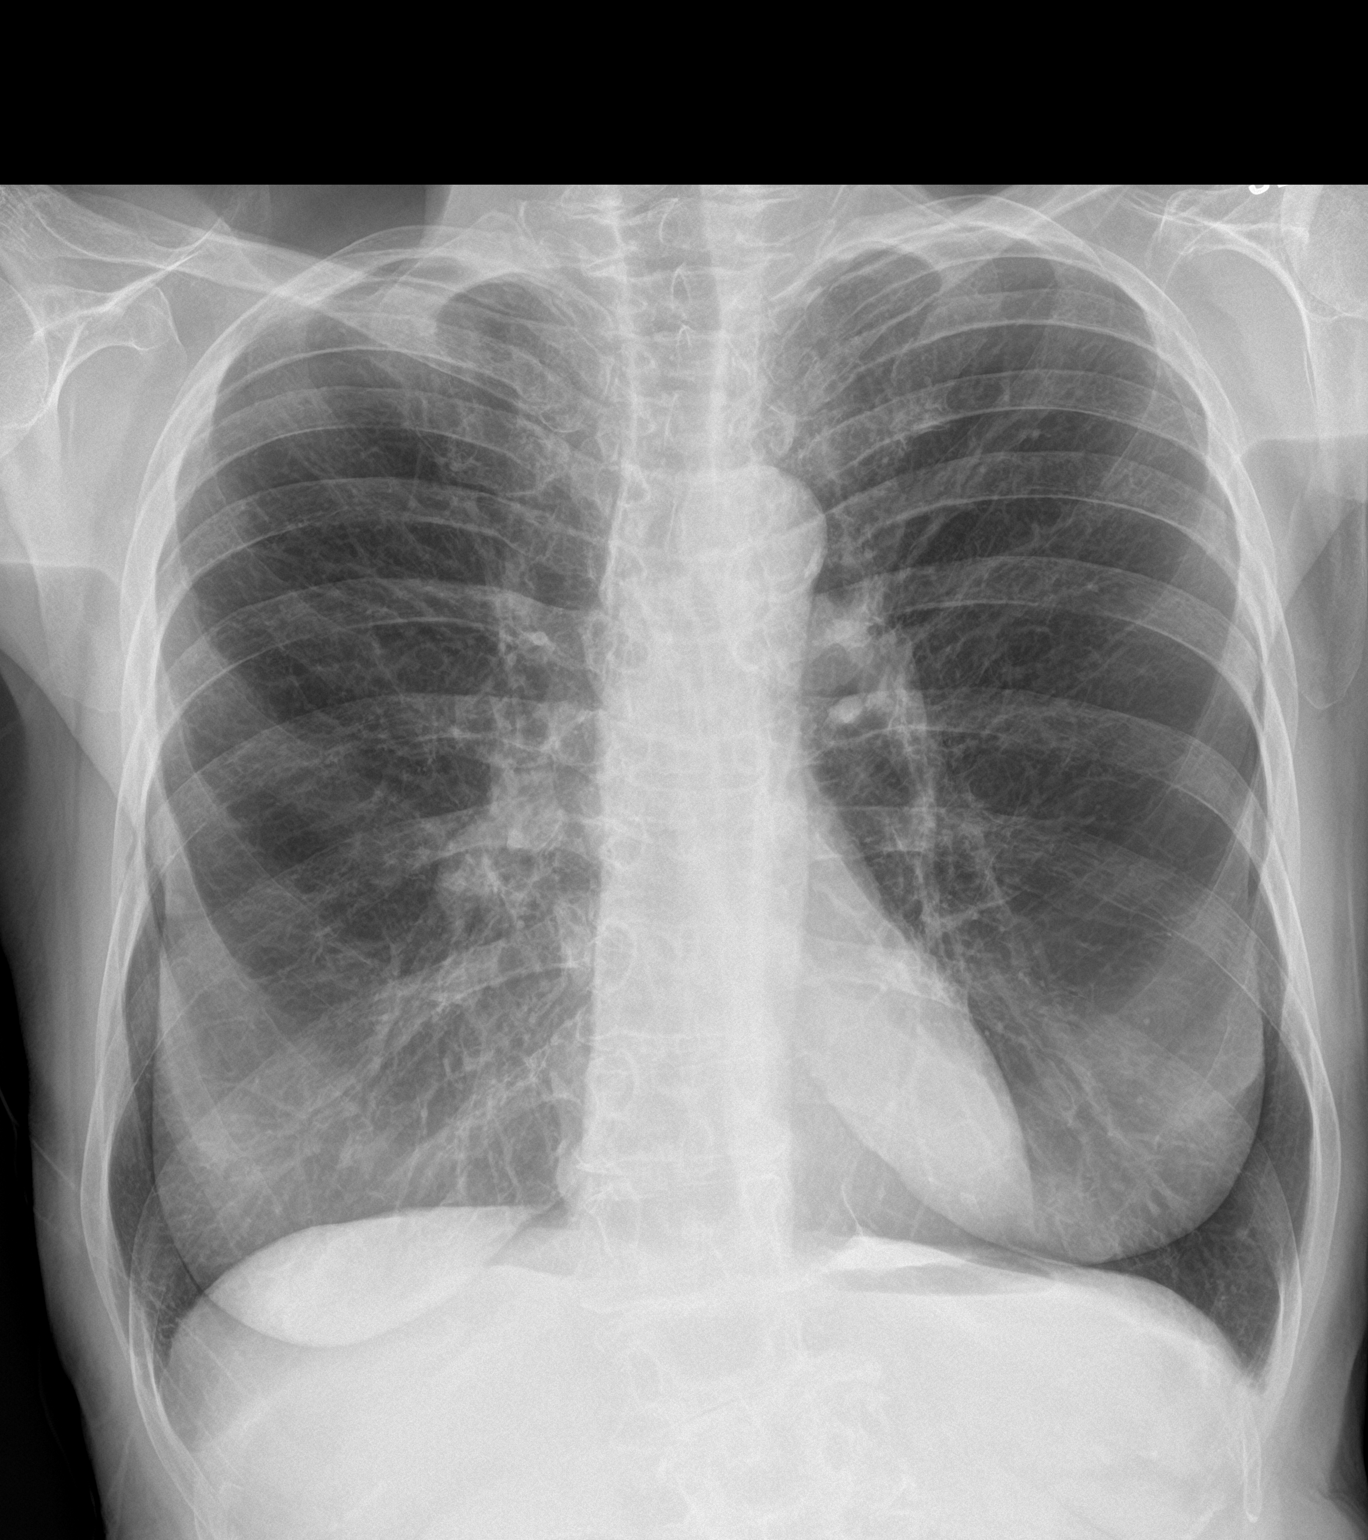

[chest lat]
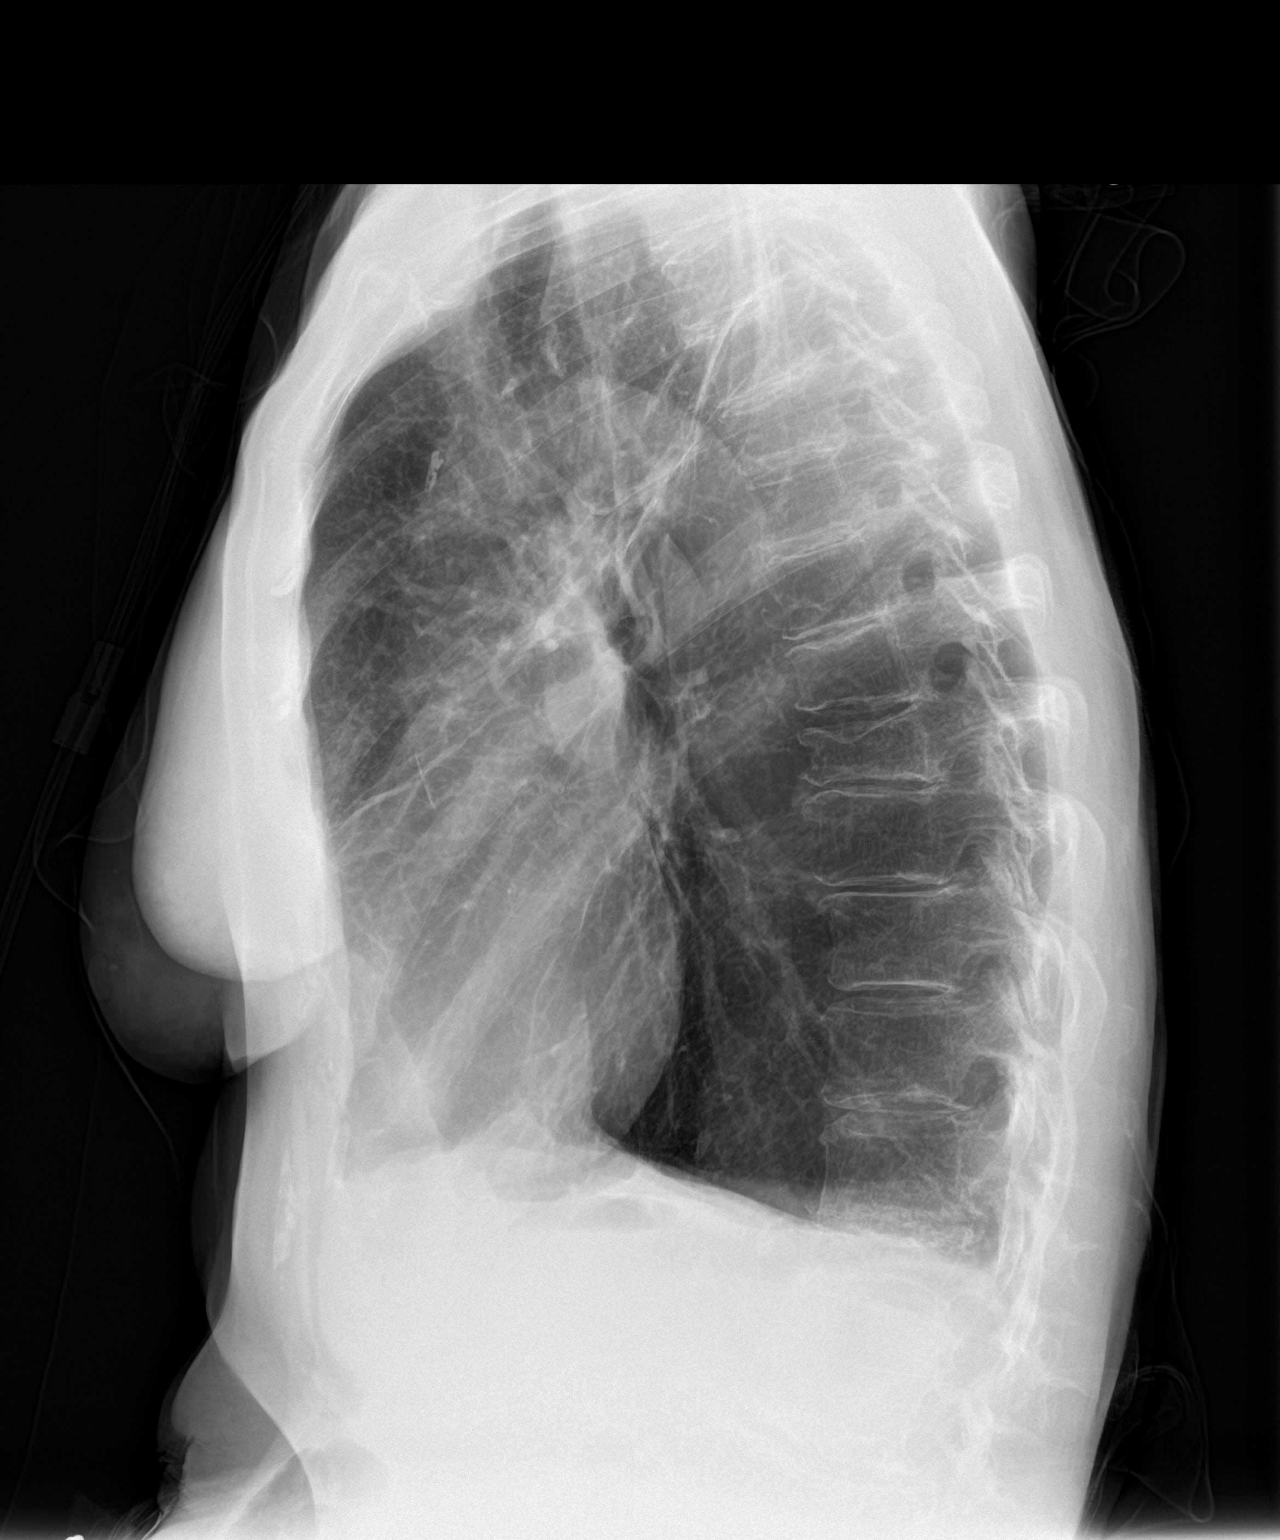

[2 of 2 positions shown; findings below may reference images not displayed]

FINDINGS: No active infiltrate or effusion is seen. The lungs are hyperaerated
with increased AP diameter and some flattening of the hemidiaphragms
indicative of emphysema. Mediastinal and hilar contours are
unremarkable. The heart is within normal limits in size. A partially
compressed mid thoracic vertebral body appears stable.
IMPRESSION: Emphysema.  No active lung disease.

## 2016-11-15 ENCOUNTER — Encounter: Payer: Self-pay | Admitting: Emergency Medicine

## 2016-11-15 ENCOUNTER — Ambulatory Visit (INDEPENDENT_AMBULATORY_CARE_PROVIDER_SITE_OTHER): Payer: Medicare Other | Admitting: Emergency Medicine

## 2016-11-15 DIAGNOSIS — J449 Chronic obstructive pulmonary disease, unspecified: Secondary | ICD-10-CM

## 2016-11-15 DIAGNOSIS — J9611 Chronic respiratory failure with hypoxia: Secondary | ICD-10-CM | POA: Diagnosis not present

## 2016-11-15 DIAGNOSIS — J301 Allergic rhinitis due to pollen: Secondary | ICD-10-CM | POA: Diagnosis not present

## 2016-11-15 NOTE — Assessment & Plan Note (Signed)
Continue fluticasone nasal spray as you are taking it

## 2016-11-15 NOTE — Assessment & Plan Note (Signed)
Please continue your inhaled medications as you have been taking them Use Mucinex as needed to assist with secretion clearance Continue your flutter valve Continue oxygen at 2-3 L/m Follow with Dr [name] in 4 months or sooner if you have any problems.

## 2016-11-15 NOTE — Patient Instructions (Addendum)
Please continue your inhaled medications as you have been taking them Use Mucinex as needed to assist with secretion clearance Continue your flutter valve Continue fluticasone nasal spray as you are taking it Continue oxygen at 2-3 L/m Follow with Dr [name] in 4 months or sooner if you have any problems.

## 2016-11-15 NOTE — Progress Notes (Signed)
Subjective:    Patient ID: Cheyenne [name], female    DOB: March 02, 1946, 71 y.o.   MRN: [id number]  HPI 71 yo smoker, former EtOH abuse, hx hypothroidism, DJD, anxiety. Was diagnosed clinically with "asthma" as an adult in 2000 in setting bronchospasm and the flu.    ROV 02/10/16 -- this follow-up visit for history of severe COPD with associated chronic hypoxemic respiratory failure. She was treated 11/13/15 with antibiotics and steroids for an acute exacerbation of her COPD. She returns today for regular maintenance visit.  She reports that for the last couple weeks she has been worse - more sinus fullness and congestion, associated with HA. Over the last 2-3 days much more cough, occas productive Thick phlegm, gray. Maybe some blood in it 2 weeks ago. She has been weak. She fell once since last visit, she feels that imbalance contributed. She has more wheeze, dyspnea. Her o2 is on 3L/min.        ROV 05/24/16 --  This follow-up visit for history of chronic hypoxemic respiratory failure in the setting of Gold D COPD. She reports that she unfortunately had a hospitalization for an acute exacerbation in early December, a fall and a L hydropneumothorax. She was able to get her chest tube out, discharge to [facility] for rehab. She has been home for a few days. She is on  Breo, Spiriva, albuterol prn - has needed rarely since the hospitalization. Oxygen at 2L/min. She is improved, is now close to her breathing baseline. She is coughing about once a day, has some mucous to clear.          ROV 07/21/16 --           Patient has a history of severe COPD with associated hypoxemic respiratory failure. Several months difficulty. She had a fall with a left hydropneumothorax and chest tube as detailed above. She has also experienced R lower lobe pneumonia with airspace disease on CT scan of the chest. This was treated with anabiotic's. CXR with a RLL infiltrate. She underwent a repeat CT scan yesterday that I have personally  reviewed. Shows some RLL medial infiltrate, two nodules of unclear significance on the R. She finished abx and pred, treated in Feb March. She is still having some weakness, fatigue. She has not been coughing until last week, productive of thick mucous.   She is on flonase, spiriva + breo, she uses albuterol daily. Her O2 is at 2-3L/min. She is doing PT.   ROV 08/23/16 -- this follow-up visit for patient with a history of severe COPD, chronic hypoxemic respiratory failure. Recent traumatic left hydropneumothorax as detailed above. She reports that she has some R sided insp pain, more in the evening or night. She continues to be limited w exertion. She uses albuterol about once every 2 days, for exertional SOB or wheeze. Minimal cough. She is on 2-3L/min at all times. No flares since last time.   ROV 11/15/16 -- Cheyenne [name] has a hx severe COPD with associated chronic hypoxemic respiratory failure. She reports that she has recovered some since the recent PNA and PTX. Feels that she is back up to baseline. She is on Spiriva and Breo, reliable. Uses SABA about 2x a day. She does have DOE even w short distances. Some cough, thick secretions, uses mucinex prn.    No flowsheet data found. Gt  Review of Systems As per HPI    Objective:   Physical Exam Vitals:   11/15/16 1441  BP: 106/68  Pulse: (!) 113  SpO2: 94%  Weight: 95 lb 9.6 oz (43.4 kg)  Height: 5\' 1"  (1.549 m)   Gen: Pleasant, thin, some mild resp distress, some difficulty completing a full sentence.   ENT: No lesions,  mouth clear,  oropharynx clear, no postnasal drip  Neck: No JVD, no TMG, no carotid bruits  Lungs: , No accessory muscle use, Clear bilaterally with a normal respiratory cycle, exp wheeze on forced expiration.  Cardiovascular: RRR, heart sounds normal, no murmur or gallops, no peripheral edema  Musculoskeletal: No deformities, no cyanosis or clubbing  Neuro: awake, she is a bit slower to respond today.   Skin: Warm,  no lesions or rashes   04/07/16  --  COMPARISON:  03/29/2016, 05/12/2012  FINDINGS: Cardiovascular: Satisfactory opacification of the pulmonary arteries to the segmental level. No evidence of pulmonary embolism. Normal heart size. No pericardial effusion. Thoracic aortic atherosclerosis. Coronary artery atherosclerosis of the LAD, ramus intermedius and RCA.  Mediastinum/Nodes: No enlarged mediastinal, hilar, or axillary lymph nodes. Thyroid gland, trachea, and esophagus demonstrate no significant findings. Right lower lobe airspace disease.  Lungs/Pleura: No pneumothorax. Severe bilateral emphysematous changes most severe in the left lower lobe. No pleural effusion. Right lower lobe airspace disease. 7 mm right lower lobe pulmonary nodule unchanged compared with 05/12/2012.  Upper Abdomen: No acute abnormality.  Musculoskeletal: No acute osseous abnormality. Chronic T7 and T8 vertebral body compression fractures. Degenerative disc disease throughout the thoracic spine. Small amount of soft tissue emphysema in the left lateral chest wall.  Review of the MIP images confirms the above findings.  IMPRESSION: 1. No evidence of pulmonary embolus. 2. Right lower lobe airspace disease concerning for pneumonia. 3.  Aortic Atherosclerosis (ICD10-170.0) 4.  Emphysema. (ICD10-J43.9   Assessment & Plan:  COPD  D  Please continue your inhaled medications as you have been taking them Use Mucinex as needed to assist with secretion clearance Continue your flutter valve Continue oxygen at 2-3 L/m Follow with Dr [name] in 4 months or sooner if you have any problems.  Allergic rhinitis Continue fluticasone nasal spray as you are taking it  Chronic respiratory failure (HCC) Continue current oxygen at 2-3 L/m. She knows to increase with exertion.  Levy Pupaobert Jasha Hodzic, MD, PhD 11/15/2016, 3:04 PM [facility] [phone] or if no answer [phone]

## 2016-11-15 NOTE — Assessment & Plan Note (Signed)
Continue current oxygen at 2-3 L/m. She knows to increase with exertion.

## 2016-11-30 ENCOUNTER — Other Ambulatory Visit: Payer: Self-pay | Admitting: Emergency Medicine

## 2016-12-04 ENCOUNTER — Other Ambulatory Visit: Payer: Self-pay | Admitting: Emergency Medicine

## 2017-01-05 DIAGNOSIS — M5416 Radiculopathy, lumbar region: Secondary | ICD-10-CM | POA: Diagnosis not present

## 2017-02-14 DIAGNOSIS — Z23 Encounter for immunization: Secondary | ICD-10-CM | POA: Diagnosis not present

## 2017-02-23 ENCOUNTER — Ambulatory Visit (INDEPENDENT_AMBULATORY_CARE_PROVIDER_SITE_OTHER): Payer: Medicare Other | Admitting: Pulmonary Disease

## 2017-02-23 ENCOUNTER — Ambulatory Visit: Payer: Medicare Other | Admitting: Pulmonary Disease

## 2017-02-23 ENCOUNTER — Encounter: Payer: Self-pay | Admitting: Pulmonary Disease

## 2017-02-23 VITALS — BP 110/70 | HR 109 | Ht 61.0 in | Wt 93.8 lb

## 2017-02-23 DIAGNOSIS — J441 Chronic obstructive pulmonary disease with (acute) exacerbation: Secondary | ICD-10-CM | POA: Diagnosis not present

## 2017-02-23 DIAGNOSIS — J449 Chronic obstructive pulmonary disease, unspecified: Secondary | ICD-10-CM | POA: Diagnosis not present

## 2017-02-23 MED ORDER — VALACYCLOVIR HCL 1 G PO TABS: 1000.0000 mg | ORAL_TABLET | Freq: Three times a day (TID) | ORAL | 0 refills | Status: AC

## 2017-02-23 MED ORDER — PREDNISONE 10 MG PO TABS: ORAL_TABLET | ORAL | 0 refills | Status: DC

## 2017-02-23 MED ORDER — AZITHROMYCIN 250 MG PO TABS: ORAL_TABLET | ORAL | 0 refills | Status: AC

## 2017-02-23 NOTE — Patient Instructions (Addendum)
We will give you a prescription for Z-Pak and a prednisone taper starting at 40 mg.  Reduce prednisone by 10 mg every 3 days We will give you a prescription was for valacyclovir 1 g 3 times daily for 7 days Continue using the Breo and Spiriva.  Follow-up with Dr. [name] in 1 month If your symptoms worsen then please go to the emergency room.

## 2017-02-23 NOTE — Progress Notes (Signed)
Cheyenne [name]    [id number]    [birth year]  Primary Care Physician:Barnes, Lanora Manis, MD  Referring Physician: Juluis Rainier, MD [address] [place], [place] [address]  Chief complaint:   Acute visit for COPD exacerbation, rash  HPI: 71 year old with severe COPD, asthma, chronic hypoxic respiratory failure.  Complains of cough with white mucus, increased dyspnea with wheezing for the past 3 days.  She also reports vesicular rash over the neck and back with pain. Denies any fevers, chills.  Outpatient Encounter Prescriptions as of 02/23/2017  Medication Sig  . BREO ELLIPTA 100-25 MCG/INH AEPB INHALE ONE PUFF INTO THE LUNGS ONCE DAILY  . calcium carbonate (OS-CAL) 600 MG TABS Take 600 mg by mouth daily.    . feeding supplement (BOOST / RESOURCE BREEZE) LIQD Take 1 Container by mouth 3 (three) times daily between meals.  . fluticasone (FLONASE) 50 MCG/ACT nasal spray PLACE 2 SPRAYS INTO BOTH NOSTRILS DAILY  . glucosamine-chondroitin 500-400 MG tablet Take 1 tablet by mouth 2 (two) times daily.  Marland Kitchen HYDROcodone-acetaminophen (NORCO) 7.5-325 MG tablet Take 1 tablet by mouth 3 (three) times daily as needed.  Marland Kitchen levothyroxine (SYNTHROID, LEVOTHROID) 50 MCG tablet Take 50 mcg by mouth daily.    Marland Kitchen LORazepam (ATIVAN) 0.5 MG tablet TAKE ONE (1) TABLET BY MOUTH TWO (2) TIMES DAILY AS NEEDED  . methylcellulose (ARTIFICIAL TEARS) 1 % ophthalmic solution Place 1 drop into both eyes daily as needed (allergies/dry eyes).   . Multiple Vitamin (MULTIVITAMIN WITH MINERALS) TABS tablet Take 1 tablet by mouth daily.  . Omega-3 Fatty Acids (FISH OIL) 1000 MG CAPS Take 1 capsule by mouth every evening.   . OXYGEN 24/7- 2lpm with sleep and 2-4 with exertion  . PROAIR HFA 108 (90 Base) MCG/ACT inhaler INHALE UP TO 2 PUFFS EVERY 4 HOURS AS NEEDED (Patient taking differently: INHALE UP TO 2 PUFFS EVERY 4 HOURS AS NEEDED for shortness of breath/wheezing)  . Respiratory Therapy Supplies (FLUTTER)  DEVI Use as directed  . SPIRIVA HANDIHALER 18 MCG inhalation capsule USE ONE (1) CAPSULE INHALATION ONCE DAILY  . temazepam (RESTORIL) 15 MG capsule TAKE ONE CAPSULE BY MOUTH EVERY NIGHT ATBEDTIME AS NEEDED FOR SLEEP   No facility-administered encounter medications on file as of 02/23/2017.     Allergies as of 02/23/2017 - Review Complete 02/23/2017  Allergen Reaction Noted  . Penicillins Anaphylaxis   . Promethazine hcl Diarrhea and Nausea And Vomiting     Past Medical History:  Diagnosis Date  . Alcohol abuse   . Anxiety   . Asthma   . COPD (chronic obstructive pulmonary disease) (HCC)   . Depression   . DJD (degenerative joint disease)   . Hypothyroidism   . Osteopenia   . Psoriatic arthritis (HCC) 06/02/2009  . Vitamin B12 deficiency     Past Surgical History:  Procedure Laterality Date  . CATARACT EXTRACTION, BILATERAL    . SPINE SURGERY  05/25/10   lesion removed from t11- Dr. Danielle Dess  . VITRECTOMY     right    Family History  Problem Relation Age of Onset  . Other Father   . CAD Father   . Diabetes Other   . Stroke Other     Social History   Social History  . Marital status: Married    Spouse name: N/A  . Number of children: N/A  . Years of education: N/A   Occupational History  . Government social research officer    Social  History Main Topics  . Smoking status: Former Smoker    Packs/day: 0.50    Years: 40.00    Types: Cigarettes    Quit date: 04/26/2009  . Smokeless tobacco: Never Used  . Alcohol use No  . Drug use: No  . Sexual activity: Not on file   Other Topics Concern  . Not on file   Social History Narrative   Married, but husband is ill   She is primary caregiver for her mother   Government social research officerffice assistant, Former Engineer, civil (consulting)nurse    Review of systems: Review of Systems  Constitutional: Negative for fever and chills.  HENT: Negative.   Eyes: Negative for blurred vision.  Respiratory: as per HPI  Cardiovascular: Negative for chest pain and palpitations.    Gastrointestinal: Negative for vomiting, diarrhea, blood per rectum. Genitourinary: Negative for dysuria, urgency, frequency and hematuria.  Musculoskeletal: Negative for myalgias, back pain and joint pain.  Skin: Negative for itching and rash.  Neurological: Negative for dizziness, tremors, focal weakness, seizures and loss of consciousness.  Endo/Heme/Allergies: Negative for environmental allergies.  Psychiatric/Behavioral: Negative for depression, suicidal ideas and hallucinations.  All other systems reviewed and are negative.  Physical Exam: Blood pressure 110/70, pulse (!) 109, height 5\' 1"  (1.549 m), weight 93 lb 12.8 oz (42.5 kg), SpO2 90 %. Gen:      Moderate respiratory distress HEENT:  EOMI, sclera anicteric Neck:     No masses; no thyromegaly Lungs:    Bilateral crackles, expiratory wheeze; increased respiratory effort CV:         Regular rate and rhythm; no murmurs Abd:      + bowel sounds; soft, non-tender; no palpable masses, no distension Ext:    No edema; adequate peripheral perfusion Skin:      Warm and dry; vesicular rash with erythema in dermatomal distribution over left neck, back and anterior chest Neuro: alert and oriented x 3 Psych: normal mood and affect   Data Reviewed: CT chest 07/20/16-lower lobe airspace densities, subcentimeter pulmonary nodules, severe emphysema, coronary and aortic atherosclerosis. Chest x-ray 08/23/16- right lower lobe infiltrate I have reviewed images personally.  Assessment:  Severe COPD with acute exacerbation Treat with course of azithromycin and prednisone taper Continue current inhalers and supplemental oxygen. I have instructed her to go to the emergency room if there is any worsening of symptoms. Defer CXR today as she may be contagious with shingles.   Rash Likely has shingles.  Empirically treat with valacyclovir for 7 days. No need for PCR or antibody testing as rash appears characteristic.   Abnormal chest x-ray, CT  scan Previous imaging noted for lung nodules and right lower lobe infiltrate Follow-up with Dr. [name] in 1 month.  Plan/Recommendations: - Z-Pak, prednisone taper - Valacyclovir 1 g 3 times daily for 7 days.  Chilton GreathousePraveen Cruise Baumgardner MD [facility] Pager [phone] 02/23/2017, 11:09 AM  CC: Juluis RainierBarnes, [name], MD

## 2017-03-07 ENCOUNTER — Other Ambulatory Visit: Payer: Self-pay | Admitting: Emergency Medicine

## 2017-03-15 ENCOUNTER — Telehealth: Payer: Self-pay | Admitting: Emergency Medicine

## 2017-03-15 MED ORDER — HYDROCODONE-ACETAMINOPHEN 7.5-325 MG PO TABS: 1.0000 | ORAL_TABLET | Freq: Three times a day (TID) | ORAL | 0 refills | Status: DC | PRN

## 2017-03-15 NOTE — Telephone Encounter (Signed)
Yes that will be fine.  We can give her a prescription till she can get refills from her primary care.

## 2017-03-15 NOTE — Telephone Encounter (Signed)
Spoke with pt. States that she has shingles and her pain level is through the roof. Pt is prescribed Hydrocodone by Dr. Danielle DessElsner but she can't refill her prescription until Friday. She is wanting to know if our office would be willing to give her 3 tablets to hold her over until then. Advised her that RB is not here to address this matter, she asks that I send the message to PM since he recently saw her.  PM - please advise. Thanks!

## 2017-03-15 NOTE — Addendum Note (Signed)
Addended by: Cydney OkAUGUSTIN, Kenlie Seki N on: 03/15/2017 12:34 PM   Modules accepted: Orders

## 2017-03-15 NOTE — Telephone Encounter (Addendum)
Rx printed.and left on PM desk. Will await signature.

## 2017-03-21 NOTE — Telephone Encounter (Signed)
It appears that Rx for Norco #3 was placed in PM's cubby after signature. I have attempted to call pt to arrange pickup-no answer with no option to leave vm.  Will call back.

## 2017-03-22 ENCOUNTER — Ambulatory Visit (INDEPENDENT_AMBULATORY_CARE_PROVIDER_SITE_OTHER): Payer: Medicare Other | Admitting: Emergency Medicine

## 2017-03-22 ENCOUNTER — Encounter: Payer: Self-pay | Admitting: Emergency Medicine

## 2017-03-22 DIAGNOSIS — R918 Other nonspecific abnormal finding of lung field: Secondary | ICD-10-CM

## 2017-03-22 DIAGNOSIS — Z7189 Other specified counseling: Secondary | ICD-10-CM

## 2017-03-22 DIAGNOSIS — B029 Zoster without complications: Secondary | ICD-10-CM | POA: Insufficient documentation

## 2017-03-22 DIAGNOSIS — J449 Chronic obstructive pulmonary disease, unspecified: Secondary | ICD-10-CM | POA: Diagnosis not present

## 2017-03-22 DIAGNOSIS — J9611 Chronic respiratory failure with hypoxia: Secondary | ICD-10-CM

## 2017-03-22 MED ORDER — GABAPENTIN 300 MG PO CAPS: ORAL_CAPSULE | ORAL | 1 refills | Status: DC

## 2017-03-22 MED ORDER — HYDROCODONE-ACETAMINOPHEN 5-325 MG PO TABS: 1.0000 | ORAL_TABLET | Freq: Four times a day (QID) | ORAL | 0 refills | Status: AC | PRN

## 2017-03-22 NOTE — Assessment & Plan Note (Signed)
This appears to be her most active problem at this time.  She is having significant pain.  Her lesions are beginning to crust over.  We discussed gabapentin and I am happy to order this.  She will likely need to have it up titrated by her primary physician.  I will also give her prescription for Norco for her to use temporarily in addition to her standard pain medication for her DJD (managfed by Dr Danielle DessElsner).

## 2017-03-22 NOTE — Assessment & Plan Note (Signed)
Continue oxygen as ordered 

## 2017-03-22 NOTE — Assessment & Plan Note (Signed)
I will plan to repeat her CT scan of the chest in March 2019 to look for interval change.

## 2017-03-22 NOTE — Assessment & Plan Note (Signed)
With a recent exacerbation.  She remains limited and is having continued cough but she is improved.  I do not believe she is back to her usual baseline but I do not think this is an acute flare.  Continue her maintenance bronchodilators, Brio, Spiriva.

## 2017-03-22 NOTE — Patient Instructions (Addendum)
Please start gabapentin.  Take 300 mg on the first day, then 300 mg twice a day on the second day, then advance to the standard dose which is 300 mg 3 times a day.  It may be necessary to titrate this up slowly as you go forward. Please take Norco 5/325 mg up to every 4 hours if needed for shingles pain.  Please discuss your shingles with your primary physician so that further management with either pain medicine or increased gabapentin can be planned. Please continue Breo, Spiriva as you have been taking them Please continue oxygen as you have been using it Follow with Dr [name] in 4 months or sooner if you have any problems.

## 2017-03-22 NOTE — Progress Notes (Signed)
Subjective:    Patient ID: Cheyenne [name], female    DOB: 1945-12-22, 71 y.o.   MRN: [id number]  HPI 71 yo smoker, former EtOH abuse, hx hypothroidism, DJD, anxiety. Was diagnosed clinically with "asthma" as an adult in 2000 in setting bronchospasm and the flu.    ROV 08/23/16 -- this follow-up visit for patient with a history of severe COPD, chronic hypoxemic respiratory failure. Recent traumatic left hydropneumothorax as detailed above. She reports that she has some R sided insp pain, more in the evening or night. She continues to be limited w exertion. She uses albuterol about once every 2 days, for exertional SOB or wheeze. Minimal cough. She is on 2-3L/min at all times. No flares since last time.   ROV 11/15/16 -- Cheyenne [name] has a hx severe COPD with associated chronic hypoxemic respiratory failure. She reports that she has recovered some since the recent PNA and PTX. Feels that she is back up to baseline. She is on Spiriva and Breo, reliable. Uses SABA about 2x a day. She does have DOE even w short distances. Some cough, thick secretions, uses mucinex prn.   ROV 03/22/17 --this is a follow-up visit for patient with a history of severe COPD and chronic hypoxemic respiratory failure.  Also with allergic rhinitis and abnormal CT scan of the chest which has shown areas of post pneumonia scarring, new pulmonary nodular disease noted 07/20/16. She was treated for an AE end of October, felt that she improved after pred and azithro. She was also treated with valaciclovir for shingles - this is her driving problem right now. She feels that she needs a new PCP, that she can;t get the shingles managed there.    No flowsheet data found. Gt  Review of Systems As per HPI    Objective:   Physical Exam Vitals:   03/22/17 1143  BP: 122/74  Pulse: 61  SpO2: 96%  Weight: 94 lb 9.6 oz (42.9 kg)  Height: 5\' 1"  (1.549 m)   Gen: Pleasant, thin, uncomfortable but in no respiratory distress  ENT: No  lesions,  mouth clear,  oropharynx clear, no postnasal drip  Neck: No JVD, no TMG, no carotid bruits  Lungs: , No accessory muscle use, very distant, no wheezing.  Cardiovascular: RRR, heart sounds normal, no murmur or gallops, no peripheral edema  Musculoskeletal: No deformities, no cyanosis or clubbing  Neuro: awake, she is a bit slower to respond today.   Skin: Warm, rash over her left chest, left shoulder to scapula  CT chest 07/20/16 --  COMPARISON:  06/09/2015  FINDINGS: Cardiovascular: Normal heart size. There is no pericardial effusion. Aortic atherosclerosis. Calcifications within the RCA, LAD and left circumflex coronary artery noted.  Mediastinum/Nodes: No enlarged mediastinal or axillary lymph nodes. Thyroid gland, trachea, and esophagus demonstrate no significant findings.  Lungs/Pleura: No pleural effusion. Advanced changes of centrilobular and paraseptal emphysema identified. Diffuse bronchial wall thickening noted. Within the posteromedial right lower lobe there is a patchy area of progressive airspace consolidation which is increased from previous exam, image number 111 of series 3. Small pulmonary nodules are again identified. New nodule within the left upper lobe measures 4 mm, image 38 of series 3. The previous index nodule within the right lower lobe is unchanged measuring 6 mm, image 94 of series 3. Stable granuloma within the right middle lobe, image 68 of series 3. New nodular density in the right upper lobe measures 10 mm, image 60 of series 3. Also new is a  right upper lobe lung nodule measuring 1 cm, image 50 of series 3.  Upper Abdomen: No acute abnormality.  Musculoskeletal: There is degenerative disc disease identified within the thoracic spine. Stable chronic compression deformities within the mid thoracic spine.  IMPRESSION: 1. Progressive airspace densities within the posteromedial right lower lobe may represent the sequelae of  chronic aspiration and/or infection. 2. There are several pulmonary nodules which appear new from previous exam. The largest measures 10 mm. Non-contrast chest CT at 3-6 months is recommended. If the nodules are stable at time of repeat CT, then future CT at 18-24 months (from today's scan) is considered optional for low-risk patients, but is recommended for high-risk patients. This recommendation follows the consensus statement: Guidelines for Management of Incidental Pulmonary Nodules Detected on CT Images: From the Fleischner Society 2017; Radiology 2017; 284:228-243. 3. Emphysema 4. Aortic atherosclerosis and multi vessel coronary artery calcification.    Assessment & Plan:  COPD  D  With a recent exacerbation.  She remains limited and is having continued cough but she is improved.  I do not believe she is back to her usual baseline but I do not think this is an acute flare.  Continue her maintenance bronchodilators, Brio, Spiriva.  Chronic respiratory failure (HCC) Continue oxygen as ordered  Pulmonary nodules I will plan to repeat her CT scan of the chest in March 2019 to look for interval change.  Shingles This appears to be her most active problem at this time.  She is having significant pain.  Her lesions are beginning to crust over.  We discussed gabapentin and I am happy to order this.  She will likely need to have it up titrated by her primary physician.  I will also give her prescription for Norco for her to use temporarily in addition to her standard pain medication for her DJD (managfed by Dr Danielle DessElsner).   Levy Pupaobert Runette Scifres, MD, PhD 03/22/2017, 12:08 PM [facility] [phone] or if no answer [phone]

## 2017-03-31 ENCOUNTER — Other Ambulatory Visit: Payer: Self-pay | Admitting: Emergency Medicine

## 2017-03-31 IMAGING — CT CT CHEST W/O CM
2 of 3 series · 15 of 36 positions shown, 18 images · non-contrast
Comparison: 06/09/2015

CLINICAL DATA: Pneumonia.

EXAM:
CT CHEST WITHOUT CONTRAST
TECHNIQUE: Multidetector CT imaging of the chest was performed following the
standard protocol without IV contrast.

[Series 2: thorax · axial · 0.64mm/px · z∈[-304,-44]mm · 12 of 154 slices shown, 15 images]
[im 12/154  mediastinal]
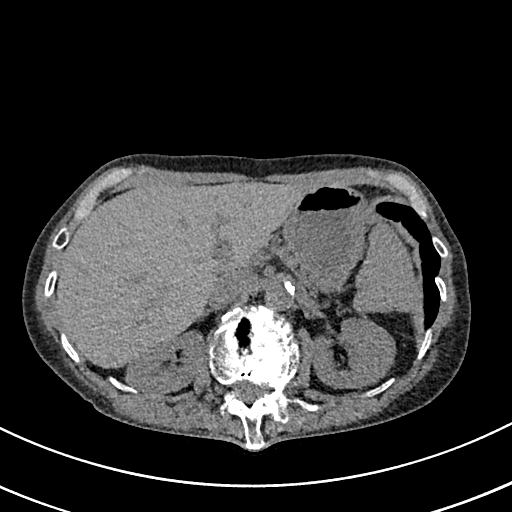
[im 12/154  lung]
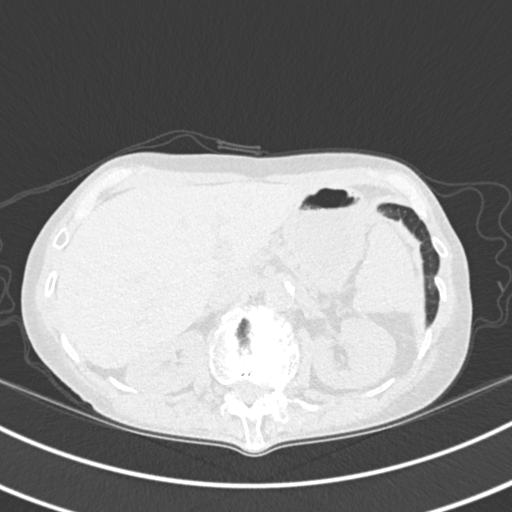
[im 23/154  lung]
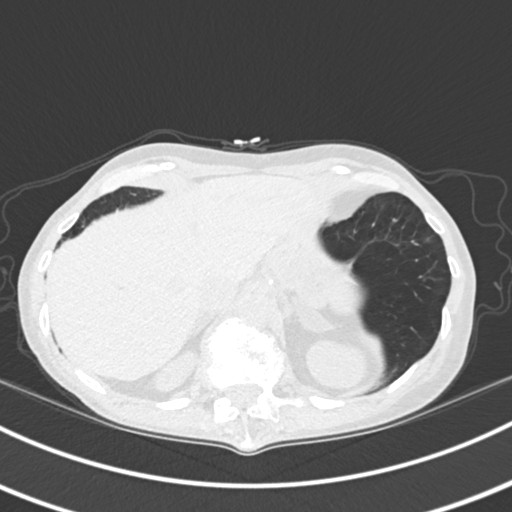
[im 35/154  lung]
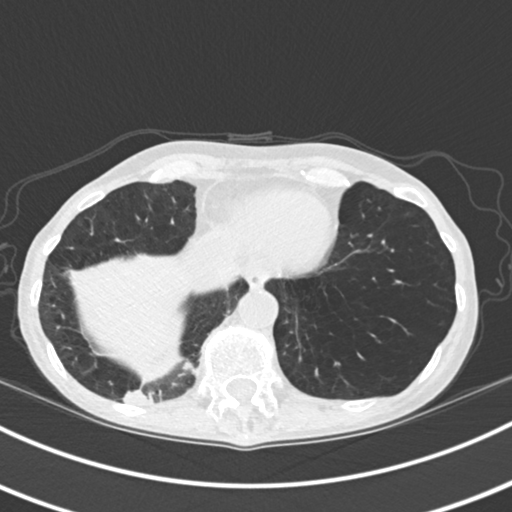
[im 46/154  lung]
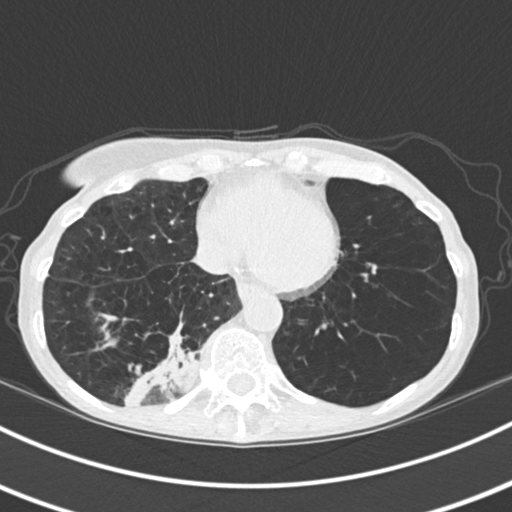
[im 57/154  mediastinal]
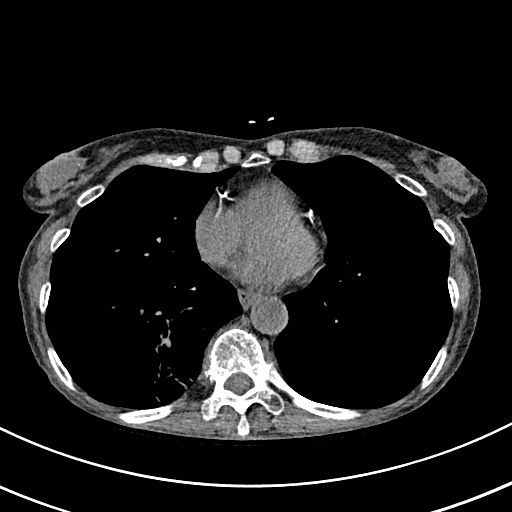
[im 57/154  lung]
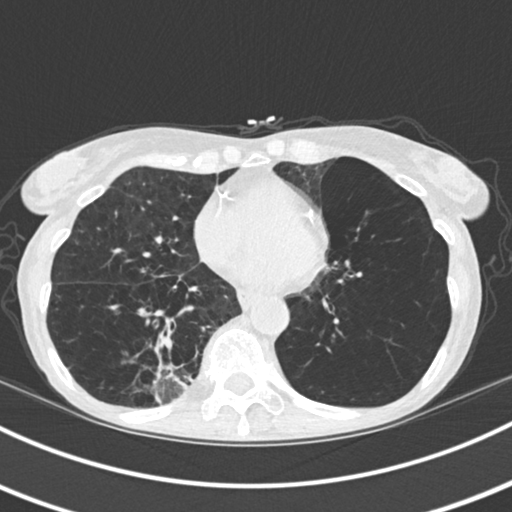
[im 69/154  lung]
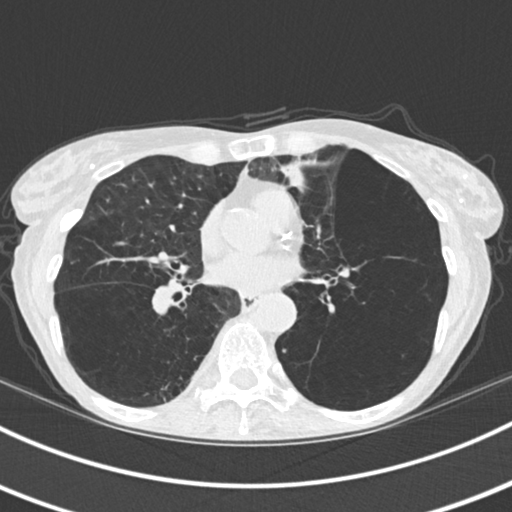
[im 86/154  lung]
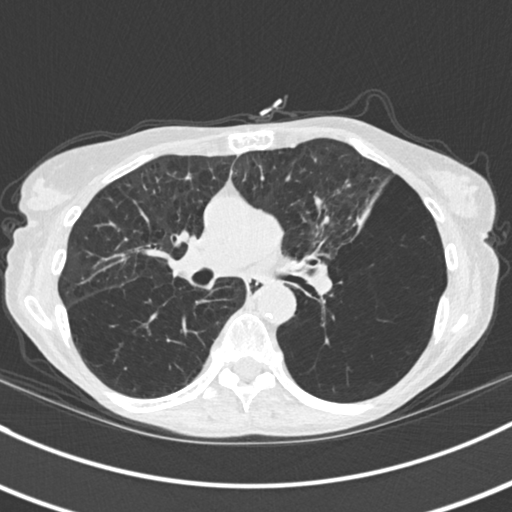
[im 97/154  lung]
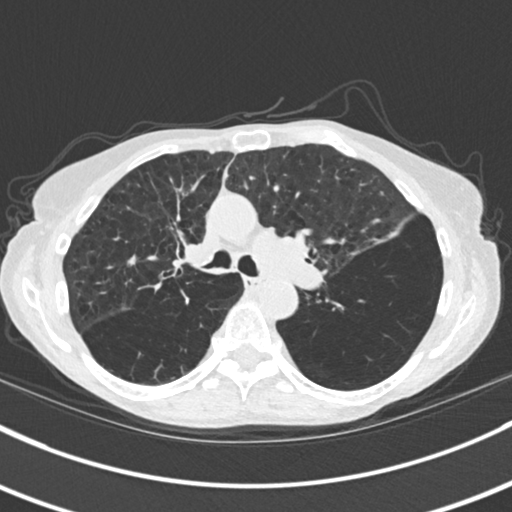
[im 108/154  mediastinal]
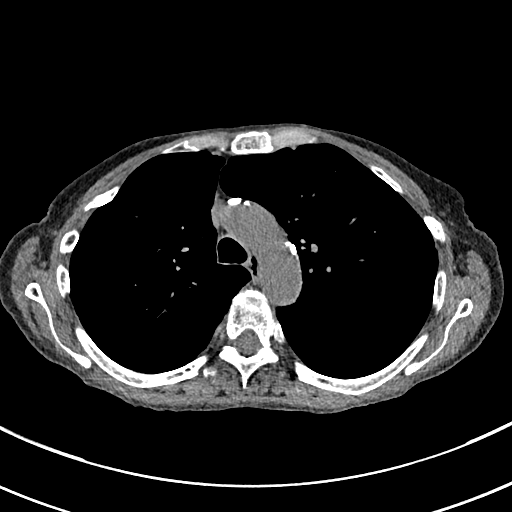
[im 108/154  lung]
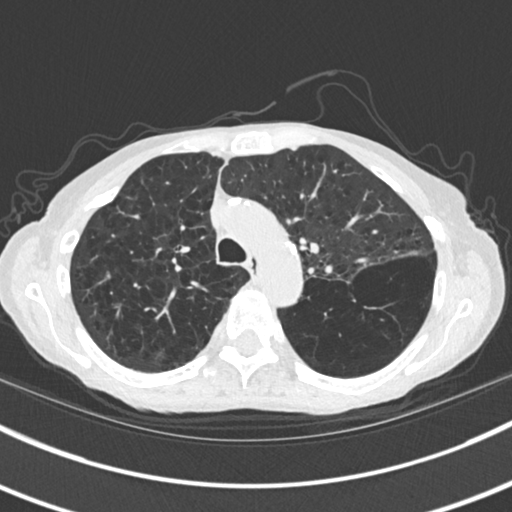
[im 120/154  lung]
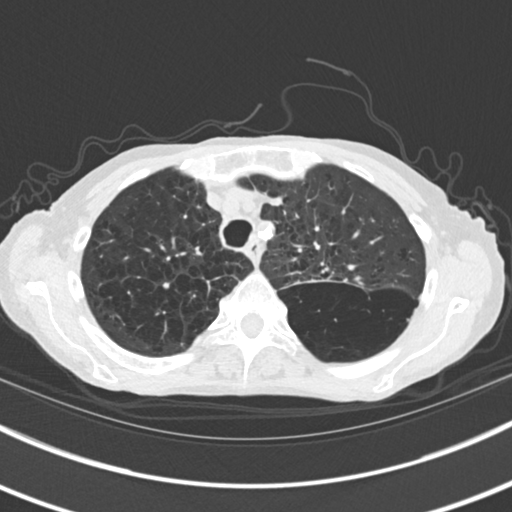
[im 131/154  lung]
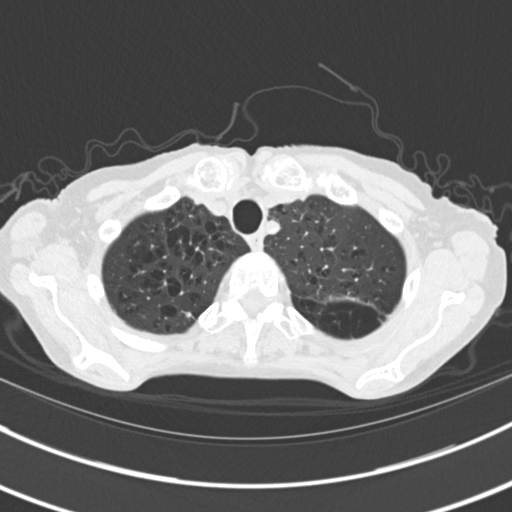
[im 142/154  lung]
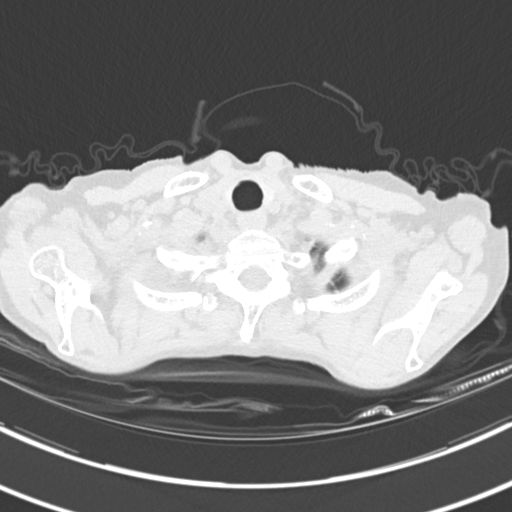

[Series 5: coronal · coronal · 0.60mm/px · 3 of 106 slices shown]
[im 22/106  lung]
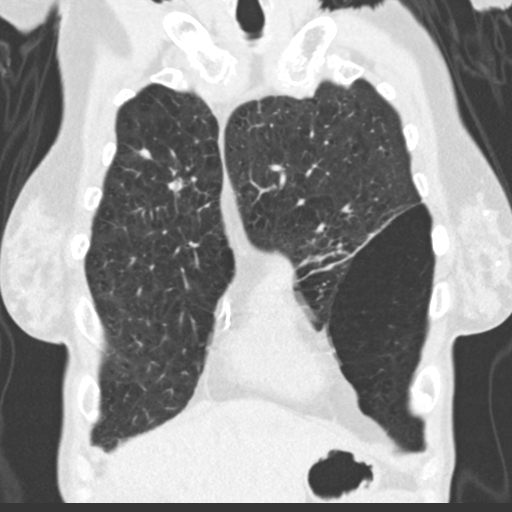
[im 43/106  lung]
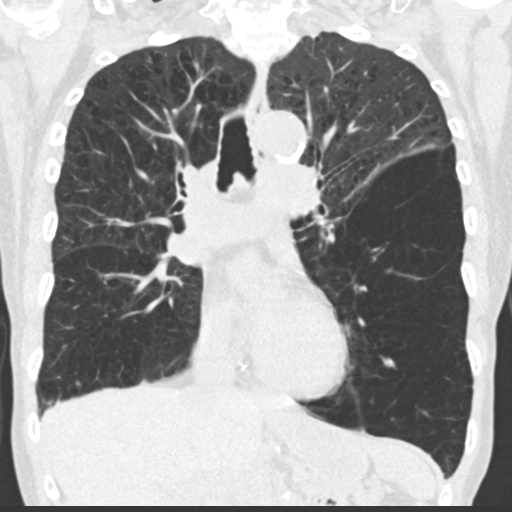
[im 64/106  lung]
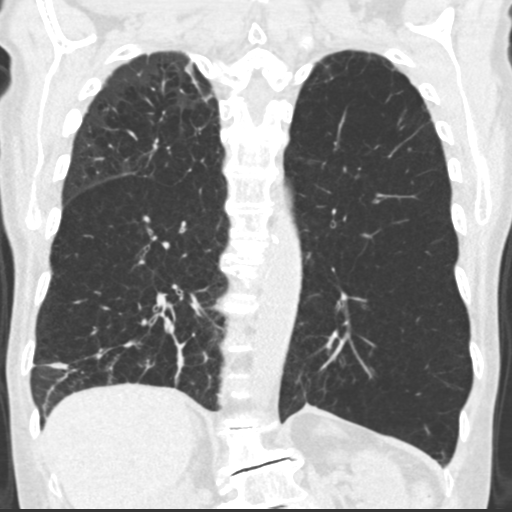

[15 of 36 positions shown; findings below may reference images not displayed]

FINDINGS: Cardiovascular: Normal heart size. There is no pericardial effusion.
Aortic atherosclerosis. Calcifications within the RCA, LAD and left
circumflex coronary artery noted.

Mediastinum/Nodes: No enlarged mediastinal or axillary lymph nodes.
Thyroid gland, trachea, and esophagus demonstrate no significant
findings.

Lungs/Pleura: No pleural effusion. Advanced changes of centrilobular
and paraseptal emphysema identified. Diffuse bronchial wall
thickening noted. Within the posteromedial right lower lobe there is
a patchy area of progressive airspace consolidation which is
increased from previous exam, image number 111 of series 3. Small
pulmonary nodules are again identified. New nodule within the left
upper lobe measures 4 mm, image 38 of series 3. The previous index
nodule within the right lower lobe is unchanged measuring 6 mm,
image 94 of series 3. Stable granuloma within the right middle lobe,
image 68 of series 3. New nodular density in the right upper lobe
measures 10 mm, image 60 of series 3. Also new is a right upper lobe
lung nodule measuring 1 cm, image 50 of series 3.

Upper Abdomen: No acute abnormality.

Musculoskeletal: There is degenerative disc disease identified
within the thoracic spine. Stable chronic compression deformities
within the mid thoracic spine.
IMPRESSION: 1. Progressive airspace densities within the posteromedial right
lower lobe may represent the sequelae of chronic aspiration and/or
infection.
2. There are several pulmonary nodules which appear new from
previous exam. The largest measures 10 mm. Non-contrast chest CT at
3-6 months is recommended. If the nodules are stable at time of
repeat CT, then future CT at 18-24 months (from today's scan) is
considered optional for low-risk patients, but is recommended for
high-risk patients. This recommendation follows the consensus
statement: Guidelines for Management of Incidental Pulmonary Nodules
Detected on CT Images: From the [HOSPITAL] 5128; Radiology
5128; [DATE].
3. Emphysema
4. Aortic atherosclerosis and multi vessel coronary artery
calcification.

## 2017-04-06 ENCOUNTER — Other Ambulatory Visit: Payer: Self-pay | Admitting: Emergency Medicine

## 2017-05-30 ENCOUNTER — Other Ambulatory Visit: Payer: Self-pay | Admitting: Emergency Medicine

## 2017-05-30 DIAGNOSIS — R627 Adult failure to thrive: Secondary | ICD-10-CM | POA: Diagnosis not present

## 2017-05-30 DIAGNOSIS — E559 Vitamin D deficiency, unspecified: Secondary | ICD-10-CM | POA: Diagnosis not present

## 2017-05-30 DIAGNOSIS — J449 Chronic obstructive pulmonary disease, unspecified: Secondary | ICD-10-CM | POA: Diagnosis not present

## 2017-05-30 DIAGNOSIS — Z9981 Dependence on supplemental oxygen: Secondary | ICD-10-CM | POA: Diagnosis not present

## 2017-05-30 DIAGNOSIS — F419 Anxiety disorder, unspecified: Secondary | ICD-10-CM | POA: Diagnosis not present

## 2017-05-30 DIAGNOSIS — D510 Vitamin B12 deficiency anemia due to intrinsic factor deficiency: Secondary | ICD-10-CM | POA: Diagnosis not present

## 2017-05-30 DIAGNOSIS — Z79899 Other long term (current) drug therapy: Secondary | ICD-10-CM | POA: Diagnosis not present

## 2017-05-30 DIAGNOSIS — E039 Hypothyroidism, unspecified: Secondary | ICD-10-CM | POA: Diagnosis not present

## 2017-06-02 ENCOUNTER — Telehealth: Payer: Self-pay | Admitting: Emergency Medicine

## 2017-06-02 NOTE — Telephone Encounter (Signed)
Will hold in triage until we have received the faxed labs.

## 2017-06-06 NOTE — Telephone Encounter (Signed)
Checked Rb's cubby and did not see labs on this pt. Called [facility] to have labs refaxed to [facility]'s attention.  Will await fax.

## 2017-06-09 NOTE — Telephone Encounter (Signed)
Katie returned call, she was advised that labs have still not been received. She is faxing them over now to the triage fax to Lindsay's attention.  Katie's CB is [phone]

## 2017-06-09 NOTE — Telephone Encounter (Signed)
I have checked RB's cubby and up front, it does not appear that records have been received.  lmtcb x1 for [employer].

## 2017-06-09 NOTE — Telephone Encounter (Signed)
Labs received, will place in RB cubby.

## 2017-06-13 NOTE — Telephone Encounter (Signed)
I will review on 2/20.  Suspect that this is chronic due to her severe COPD.  Likely no intervention needs to be made

## 2017-06-15 NOTE — Telephone Encounter (Signed)
Please let her know that I reviewed.  We do not need to make any changes at this time.  It is a chronic finding that is consistent with her COPD.

## 2017-06-15 NOTE — Telephone Encounter (Signed)
lmtcb [facility] make aware.

## 2017-07-04 ENCOUNTER — Other Ambulatory Visit: Payer: Self-pay | Admitting: Emergency Medicine

## 2017-07-05 ENCOUNTER — Telehealth: Payer: Self-pay | Admitting: Emergency Medicine

## 2017-07-05 MED ORDER — TEMAZEPAM 15 MG PO CAPS: ORAL_CAPSULE | ORAL | 0 refills | Status: DC

## 2017-07-05 MED ORDER — LORAZEPAM 0.5 MG PO TABS: ORAL_TABLET | ORAL | 0 refills | Status: DC

## 2017-07-05 NOTE — Telephone Encounter (Signed)
Spoke with Aneta MinsPhillip at [facility]. Rxs verbals have been given to him. Nothing further was needed.

## 2017-07-06 DIAGNOSIS — M5416 Radiculopathy, lumbar region: Secondary | ICD-10-CM | POA: Diagnosis not present

## 2017-07-11 ENCOUNTER — Encounter: Payer: Self-pay | Admitting: Emergency Medicine

## 2017-07-11 ENCOUNTER — Ambulatory Visit (INDEPENDENT_AMBULATORY_CARE_PROVIDER_SITE_OTHER): Payer: Medicare Other | Admitting: Emergency Medicine

## 2017-07-11 ENCOUNTER — Other Ambulatory Visit: Payer: Self-pay | Admitting: Emergency Medicine

## 2017-07-11 DIAGNOSIS — R911 Solitary pulmonary nodule: Secondary | ICD-10-CM | POA: Diagnosis not present

## 2017-07-11 DIAGNOSIS — R918 Other nonspecific abnormal finding of lung field: Secondary | ICD-10-CM

## 2017-07-11 DIAGNOSIS — J9611 Chronic respiratory failure with hypoxia: Secondary | ICD-10-CM | POA: Diagnosis not present

## 2017-07-11 DIAGNOSIS — J441 Chronic obstructive pulmonary disease with (acute) exacerbation: Secondary | ICD-10-CM | POA: Insufficient documentation

## 2017-07-11 MED ORDER — PREDNISONE 10 MG PO TABS: ORAL_TABLET | ORAL | 0 refills | Status: DC

## 2017-07-11 MED ORDER — AZITHROMYCIN 250 MG PO TABS: ORAL_TABLET | ORAL | 0 refills | Status: AC

## 2017-07-11 NOTE — Assessment & Plan Note (Signed)
Acute exacerbation of COPD.  Unclear what has precipitated.  Possibly the spring allergy season.  She is wheezing on exam has purulent mucus.  Treat her with prednisone, azithromycin and follow for resolution.  Otherwise continue her Breo, Spiriva and current oxygen as ordered.

## 2017-07-11 NOTE — Progress Notes (Signed)
Subjective:    Patient ID: Cheyenne [name], female    DOB: [birth year], 72 y.o.   MRN: [id number]  HPI 72 yo smoker, former EtOH abuse, hx hypothroidism, DJD, anxiety. Was diagnosed clinically with "asthma" as an adult in 2000 in setting bronchospasm and the flu.    ROV 08/23/16 -- this follow-up visit for patient with a history of severe COPD, chronic hypoxemic respiratory failure. Recent traumatic left hydropneumothorax as detailed above. She reports that she has some R sided insp pain, more in the evening or night. She continues to be limited w exertion. She uses albuterol about once every 2 days, for exertional SOB or wheeze. Minimal cough. She is on 2-3L/min at all times. No flares since last time.   ROV 11/15/16 -- [name] has a hx severe COPD with associated chronic hypoxemic respiratory failure. She reports that she has recovered some since the recent PNA and PTX. Feels that she is back up to baseline. She is on Spiriva and Breo, reliable. Uses SABA about 2x a day. She does have DOE even w short distances. Some cough, thick secretions, uses mucinex prn.   ROV 03/22/17 --this is a follow-up visit for patient with a history of severe COPD and chronic hypoxemic respiratory failure.  Also with allergic rhinitis and abnormal CT scan of the chest which has shown areas of post pneumonia scarring, new pulmonary nodular disease noted 07/20/16. She was treated for an AE end of October, felt that she improved after pred and azithro. She was also treated with valaciclovir for shingles - this is her driving problem right now. She feels that she needs a new PCP, that she can;t get the shingles managed there.   ROV 07/11/17 --pleasant 72 year old woman with a history of severe COPD and associated chronic hypoxemic respiratory failure.  She also has an abnormal CT scan of the chest with areas of apparent post pneumonia he hscarring and also some new pulmonary nodular disease noted 1 year ago.  We have not yet done  a repeat scan yet.  She is currently maintained on Breo, Spiriva.  Oxygen at 3-4 liters per minute.  She had been doing fairly well until about 1 week ago - noticed more dyspnea w exertion and at rest, increase in her cough. Thick mucous that is dark gray to yellow. She is on mucinex.     No flowsheet data found. Gt  Review of Systems As per HPI    Objective:   Physical Exam Vitals:   07/11/17 1153  BP: 130/82  Pulse: 93  SpO2: 91%  Weight: 92 lb (41.7 kg)   Gen: Pleasant, thin, NAD  ENT: No lesions,  mouth clear,  oropharynx clear, no postnasal drip  Neck: No JVD, no stridor  Lungs: , No accessory muscle use, very distant, soft exp wheeze B   Cardiovascular: RRR, heart sounds normal, no murmur or gallops, no peripheral edema  Musculoskeletal: No deformities, no cyanosis or clubbing  Neuro: awake, alert, appropriate, no focal deficits  Skin: Warm, rash over her left chest, left shoulder to scapula  CT chest 07/20/16 --  COMPARISON:  06/09/2015  FINDINGS: Cardiovascular: Normal heart size. There is no pericardial effusion. Aortic atherosclerosis. Calcifications within the RCA, LAD and left circumflex coronary artery noted.  Mediastinum/Nodes: No enlarged mediastinal or axillary lymph nodes. Thyroid gland, trachea, and esophagus demonstrate no significant findings.  Lungs/Pleura: No pleural effusion. Advanced changes of centrilobular and paraseptal emphysema identified. Diffuse bronchial wall thickening noted. Within the posteromedial right  lower lobe there is a patchy area of progressive airspace consolidation which is increased from previous exam, image number 111 of series 3. Small pulmonary nodules are again identified. New nodule within the left upper lobe measures 4 mm, image 38 of series 3. The previous index nodule within the right lower lobe is unchanged measuring 6 mm, image 94 of series 3. Stable granuloma within the right middle lobe, image 68 of  series 3. New nodular density in the right upper lobe measures 10 mm, image 60 of series 3. Also new is a right upper lobe lung nodule measuring 1 cm, image 50 of series 3.  Upper Abdomen: No acute abnormality.  Musculoskeletal: There is degenerative disc disease identified within the thoracic spine. Stable chronic compression deformities within the mid thoracic spine.  IMPRESSION: 1. Progressive airspace densities within the posteromedial right lower lobe may represent the sequelae of chronic aspiration and/or infection. 2. There are several pulmonary nodules which appear new from previous exam. The largest measures 10 mm. Non-contrast chest CT at 3-6 months is recommended. If the nodules are stable at time of repeat CT, then future CT at 18-24 months (from today's scan) is considered optional for low-risk patients, but is recommended for high-risk patients. This recommendation follows the consensus statement: Guidelines for Management of Incidental Pulmonary Nodules Detected on CT Images: From the Fleischner Society 2017; Radiology 2017; 284:228-243. 3. Emphysema 4. Aortic atherosclerosis and multi vessel coronary artery calcification.    Assessment & Plan:  COPD with acute exacerbation (HCC) Acute exacerbation of COPD.  Unclear what has precipitated.  Possibly the spring allergy season.  She is wheezing on exam has purulent mucus.  Treat her with prednisone, azithromycin and follow for resolution.  Otherwise continue her Breo, Spiriva and current oxygen as ordered.  Chronic respiratory failure (HCC) Oxygen at 3-4 L/min depending on level of exertion  Pulmonary nodules Bilateral scattered pulmonary nodules that we have been following with serial scans.  Some granulomatous in nature, some groundglass.  The largest is 10 mm.  Her last scan was 1 year ago.  We talked about the pros and cons of repeating given the fact that she is not a great candidate for invasive  intervention.  We will repeat the scan and review it together  Levy Pupa, MD, PhD 07/11/2017, 12:20 PM [facility] [phone] or if no answer [phone]

## 2017-07-11 NOTE — Assessment & Plan Note (Signed)
Oxygen at 3-4 L/min depending on level of exertion

## 2017-07-11 NOTE — Assessment & Plan Note (Signed)
Bilateral scattered pulmonary nodules that we have been following with serial scans.  Some granulomatous in nature, some groundglass.  The largest is 10 mm.  Her last scan was 1 year ago.  We talked about the pros and cons of repeating given the fact that she is not a great candidate for invasive intervention.  We will repeat the scan and review it together

## 2017-07-11 NOTE — Patient Instructions (Signed)
Please take prednisone and azithromycin as directed until completely gone. Continue Breo, Spiriva, oxygen as you have been using them. We will repeat your CT scan of the chest without contrast. Follow with Dr. [name] next available after your CT scan to review the results together. Please call our office if your breathing is not improving on the medication that we have given.

## 2017-07-18 ENCOUNTER — Ambulatory Visit (INDEPENDENT_AMBULATORY_CARE_PROVIDER_SITE_OTHER)
Admission: RE | Admit: 2017-07-18 | Discharge: 2017-07-18 | Disposition: A | Discharge status: Home / Self Care | Payer: Medicare Other | Source: Ambulatory Visit | Attending: Emergency Medicine | Admitting: Emergency Medicine

## 2017-07-18 DIAGNOSIS — R911 Solitary pulmonary nodule: Secondary | ICD-10-CM

## 2017-07-18 DIAGNOSIS — R918 Other nonspecific abnormal finding of lung field: Secondary | ICD-10-CM | POA: Diagnosis not present

## 2017-08-01 ENCOUNTER — Encounter: Payer: Self-pay | Admitting: Emergency Medicine

## 2017-08-01 ENCOUNTER — Ambulatory Visit (INDEPENDENT_AMBULATORY_CARE_PROVIDER_SITE_OTHER): Payer: Medicare Other | Admitting: Emergency Medicine

## 2017-08-01 VITALS — BP 116/70 | HR 94 | Ht 61.0 in | Wt 92.0 lb

## 2017-08-01 DIAGNOSIS — J9809 Other diseases of bronchus, not elsewhere classified: Secondary | ICD-10-CM | POA: Diagnosis not present

## 2017-08-01 DIAGNOSIS — R0602 Shortness of breath: Secondary | ICD-10-CM

## 2017-08-01 DIAGNOSIS — R9389 Abnormal findings on diagnostic imaging of other specified body structures: Secondary | ICD-10-CM | POA: Diagnosis not present

## 2017-08-01 NOTE — Assessment & Plan Note (Signed)
Her scattered pulmonary nodules have been variable.  Some have resolved completely, some have been stable.  There is a new left-sided nodule was not seen on her prior visit.  Unclear etiology but overall reassuring.  She would need continued serial films to follow.  She does have narrowing of her right mainstem bronchus and right upper lobe bronchus of unclear etiology.  This could reflect mucus.  Given her continued cough, intermittent hemoptysis I am concerned it could be an endobronchial lesion.  I showed this to her today.  We discussed possible bronchoscopy and airway visualization.  She understands the risks and potential benefits.  At this time we have decided to repeat her scan in 2 months to look for interval persistence or resolution.  If the airway remains irregular then we will discuss possible bronchoscopy to visualize the area.  Please continue your inhaled medications and oxygen as you are using them  We will repeat your CT scan of the chest in late May without contrast. Follow with Dr. [name] next available after the CT scan is done to review the results together

## 2017-08-01 NOTE — Patient Instructions (Signed)
Please continue your inhaled medications and oxygen as you are using them  We will repeat your CT scan of the chest in late May without contrast. Follow with Dr. [name] next available after the CT scan is done to review the results together

## 2017-08-01 NOTE — Progress Notes (Signed)
Subjective:    Patient ID: Cheyenne [name], female    DOB: February 11, 1946, 72 y.o.   MRN: [id number]  HPI 72 yo smoker, former EtOH abuse, hx hypothroidism, DJD, anxiety. Was diagnosed clinically with "asthma" as an adult in 2000 in setting bronchospasm and the flu.    ROV 08/23/16 -- this follow-up visit for patient with a history of severe COPD, chronic hypoxemic respiratory failure. Recent traumatic left hydropneumothorax as detailed above. She reports that she has some R sided insp pain, more in the evening or night. She continues to be limited w exertion. She uses albuterol about once every 2 days, for exertional SOB or wheeze. Minimal cough. She is on 2-3L/min at all times. No flares since last time.   ROV 11/15/16 -- [name] has a hx severe COPD with associated chronic hypoxemic respiratory failure. She reports that she has recovered some since the recent PNA and PTX. Feels that she is back up to baseline. She is on Spiriva and Breo, reliable. Uses SABA about 2x a day. She does have DOE even w short distances. Some cough, thick secretions, uses mucinex prn.   ROV 03/22/17 --this is a follow-up visit for patient with a history of severe COPD and chronic hypoxemic respiratory failure.  Also with allergic rhinitis and abnormal CT scan of the chest which has shown areas of post pneumonia scarring, new pulmonary nodular disease noted 07/20/16. She was treated for an AE end of October, felt that she improved after pred and azithro. She was also treated with valaciclovir for shingles - this is her driving problem right now. She feels that she needs a new PCP, that she can;t get the shingles managed there.   ROV 07/11/17 --pleasant 72 year old woman with a history of severe COPD and associated chronic hypoxemic respiratory failure.  She also has an abnormal CT scan of the chest with areas of apparent post pneumonia he hscarring and also some new pulmonary nodular disease noted 1 year ago.  We have not yet done  a repeat scan yet.  She is currently maintained on Breo, Spiriva.  Oxygen at 3-4 liters per minute.  She had been doing fairly well until about 1 week ago - noticed more dyspnea w exertion and at rest, increase in her cough. Thick mucous that is dark gray to yellow. She is on mucinex.   ROV 08/01/17 --patient has a history of severe COPD and chronic hypoxemic respiratory failure.  She also has some pulmonary nodular disease on CT scan of the chest that we have been following with interval films.  Her most recent scan was 07/19/17 (1 year interval) that I have reviewed.  This shows significant emphysema, some thickening of the right mainstem bronchus and proximal right upper lobe bronchus of unclear etiology and significance, decrease in size of several of her areas of pulmonary nodularity.  Some are also stable in size.  There is a new 6 mm left upper lobe nodule. She has had a lot of cough, stable severe SOB. She has had some purulent sputum, has seen some hemoptysis in her mucous for the last month. I treated her with pred + abx last visit > she did improve.    No flowsheet data found. Gt  Review of Systems As per HPI    Objective:   Physical Exam Vitals:   08/01/17 1547 08/01/17 1548  BP:  116/70  Pulse:  94  SpO2:  96%  Weight: 92 lb (41.7 kg)   Height: 5\' 1"  (  1.549 m)    Gen: Pleasant, thin, NAD  ENT: No lesions,  mouth clear,  oropharynx clear, no postnasal drip  Neck: No JVD, no stridor  Lungs: , No accessory muscle use, very distant, soft exp wheeze B   Cardiovascular: RRR, heart sounds normal, no murmur or gallops, no peripheral edema  Musculoskeletal: No deformities, no cyanosis or clubbing  Neuro: awake, alert, appropriate, no focal deficits  Skin: Warm, rash over her left chest, left shoulder to scapula  CT chest 07/20/16 --  COMPARISON:  06/09/2015  FINDINGS: Cardiovascular: Normal heart size. There is no pericardial effusion. Aortic atherosclerosis.  Calcifications within the RCA, LAD and left circumflex coronary artery noted.  Mediastinum/Nodes: No enlarged mediastinal or axillary lymph nodes. Thyroid gland, trachea, and esophagus demonstrate no significant findings.  Lungs/Pleura: No pleural effusion. Advanced changes of centrilobular and paraseptal emphysema identified. Diffuse bronchial wall thickening noted. Within the posteromedial right lower lobe there is a patchy area of progressive airspace consolidation which is increased from previous exam, image number 111 of series 3. Small pulmonary nodules are again identified. New nodule within the left upper lobe measures 4 mm, image 38 of series 3. The previous index nodule within the right lower lobe is unchanged measuring 6 mm, image 94 of series 3. Stable granuloma within the right middle lobe, image 68 of series 3. New nodular density in the right upper lobe measures 10 mm, image 60 of series 3. Also new is a right upper lobe lung nodule measuring 1 cm, image 50 of series 3.  Upper Abdomen: No acute abnormality.  Musculoskeletal: There is degenerative disc disease identified within the thoracic spine. Stable chronic compression deformities within the mid thoracic spine.  IMPRESSION: 1. Progressive airspace densities within the posteromedial right lower lobe may represent the sequelae of chronic aspiration and/or infection. 2. There are several pulmonary nodules which appear new from previous exam. The largest measures 10 mm. Non-contrast chest CT at 3-6 months is recommended. If the nodules are stable at time of repeat CT, then future CT at 18-24 months (from today's scan) is considered optional for low-risk patients, but is recommended for high-risk patients. This recommendation follows the consensus statement: Guidelines for Management of Incidental Pulmonary Nodules Detected on CT Images: From the Fleischner Society 2017; Radiology 2017; 284:228-243. 3.  Emphysema 4. Aortic atherosclerosis and multi vessel coronary artery calcification.    Assessment & Plan:  COPD  D  Significant emphysema.  She has daily symptoms.  Moderately well controlled at this time.  She does have purulent sputum but I would like to defer treating her with an antibiotic if possible.  She was just treated at our last visit 1 month ago.  Continue her current bronchodilator regimen  Abnormal CT scan, chest Her scattered pulmonary nodules have been variable.  Some have resolved completely, some have been stable.  There is a new left-sided nodule was not seen on her prior visit.  Unclear etiology but overall reassuring.  She would need continued serial films to follow.  She does have narrowing of her right mainstem bronchus and right upper lobe bronchus of unclear etiology.  This could reflect mucus.  Given her continued cough, intermittent hemoptysis I am concerned it could be an endobronchial lesion.  I showed this to her today.  We discussed possible bronchoscopy and airway visualization.  She understands the risks and potential benefits.  At this time we have decided to repeat her scan in 2 months to look for interval persistence or  resolution.  If the airway remains irregular then we will discuss possible bronchoscopy to visualize the area.  Please continue your inhaled medications and oxygen as you are using them  We will repeat your CT scan of the chest in late May without contrast. Follow with Dr. [name] next available after the CT scan is done to review the results together  Levy Pupa, MD, PhD 08/01/2017, 4:28 PM [facility] [phone] or if no answer [phone]

## 2017-08-01 NOTE — Assessment & Plan Note (Signed)
Significant emphysema.  She has daily symptoms.  Moderately well controlled at this time.  She does have purulent sputum but I would like to defer treating her with an antibiotic if possible.  She was just treated at our last visit 1 month ago.  Continue her current bronchodilator regimen

## 2017-08-03 ENCOUNTER — Other Ambulatory Visit: Payer: Self-pay | Admitting: Emergency Medicine

## 2017-09-26 ENCOUNTER — Ambulatory Visit (INDEPENDENT_AMBULATORY_CARE_PROVIDER_SITE_OTHER)
Admission: RE | Admit: 2017-09-26 | Discharge: 2017-09-26 | Disposition: A | Discharge status: Home / Self Care | Payer: Medicare Other | Source: Ambulatory Visit | Attending: Emergency Medicine | Admitting: Emergency Medicine

## 2017-09-26 DIAGNOSIS — J439 Emphysema, unspecified: Secondary | ICD-10-CM | POA: Diagnosis not present

## 2017-09-26 DIAGNOSIS — J9809 Other diseases of bronchus, not elsewhere classified: Secondary | ICD-10-CM

## 2017-09-28 ENCOUNTER — Other Ambulatory Visit: Payer: Self-pay | Admitting: Emergency Medicine

## 2017-10-03 ENCOUNTER — Encounter: Payer: Self-pay | Admitting: Emergency Medicine

## 2017-10-03 ENCOUNTER — Ambulatory Visit (INDEPENDENT_AMBULATORY_CARE_PROVIDER_SITE_OTHER): Payer: Medicare Other | Admitting: Emergency Medicine

## 2017-10-03 VITALS — BP 120/66 | HR 102 | Wt 90.0 lb

## 2017-10-03 DIAGNOSIS — J449 Chronic obstructive pulmonary disease, unspecified: Secondary | ICD-10-CM

## 2017-10-03 MED ORDER — PREDNISONE 10 MG PO TABS: ORAL_TABLET | ORAL | 0 refills | Status: AC

## 2017-10-03 NOTE — Assessment & Plan Note (Signed)
Pulmonary nodules are stable, actually the 2 noncalcified nodules are smaller than they were on her prior CT.  This was done on 09/26/2017.  I am not sure that we need to plan to follow any particular interval given the reassuring findings and her probable inability to tolerate any aggressive diagnostic or treatment interventions.

## 2017-10-03 NOTE — Progress Notes (Signed)
Subjective:    Patient ID: Cheyenne [name], female    DOB: [birth year], 72 y.o.   MRN: [id number]  HPI 72 yo smoker, former EtOH abuse, hx hypothroidism, DJD, anxiety. Was diagnosed clinically with "asthma" as an adult in 2000 in setting bronchospasm and the flu.    ROV 08/23/16 -- this follow-up visit for patient with a history of severe COPD, chronic hypoxemic respiratory failure. Recent traumatic left hydropneumothorax as detailed above. She reports that she has some R sided insp pain, more in the evening or night. She continues to be limited w exertion. She uses albuterol about once every 2 days, for exertional SOB or wheeze. Minimal cough. She is on 2-3L/min at all times. No flares since last time.   ROV 11/15/16 -- Cheyenne [name] has a hx severe COPD with associated chronic hypoxemic respiratory failure. She reports that she has recovered some since the recent PNA and PTX. Feels that she is back up to baseline. She is on Spiriva and Breo, reliable. Uses SABA about 2x a day. She does have DOE even w short distances. Some cough, thick secretions, uses mucinex prn.   ROV 03/22/17 --this is a follow-up visit for patient with a history of severe COPD and chronic hypoxemic respiratory failure.  Also with allergic rhinitis and abnormal CT scan of the chest which has shown areas of post pneumonia scarring, new pulmonary nodular disease noted 07/20/16. She was treated for an AE end of October, felt that she improved after pred and azithro. She was also treated with valaciclovir for shingles - this is her driving problem right now. She feels that she needs a new PCP, that she can;t get the shingles managed there.   ROV 07/11/17 --pleasant 72 year old woman with a history of severe COPD and associated chronic hypoxemic respiratory failure.  She also has an abnormal CT scan of the chest with areas of apparent post pneumonia he hscarring and also some new pulmonary nodular disease noted 1 year ago.  We have not yet done  a repeat scan yet.  She is currently maintained on Breo, Spiriva.  Oxygen at 3-4 liters per minute.  She had been doing fairly well until about 1 week ago - noticed more dyspnea w exertion and at rest, increase in her cough. Thick mucous that is dark gray to yellow. She is on mucinex.   ROV 08/01/17 --patient has a history of severe COPD and chronic hypoxemic respiratory failure.  She also has some pulmonary nodular disease on CT scan of the chest that we have been following with interval films.  Her most recent scan was 07/19/17 (1 year interval) that I have reviewed.  This shows significant emphysema, some thickening of the right mainstem bronchus and proximal right upper lobe bronchus of unclear etiology and significance, decrease in size of several of her areas of pulmonary nodularity.  Some are also stable in size.  There is a new 6 mm left upper lobe nodule. She has had a lot of cough, stable severe SOB. She has had some purulent sputum, has seen some hemoptysis in her mucous for the last month. I treated her with pred + abx last visit > she did improve.   ROV 10/03/17 --this is a follow-up visit for patient with chronic hypoxemic respiratory failure due to severe COPD.  She also has pulmonary nodular disease that we have followed with serial CT scans of the chest. We repeated her CT 09/26/2017 and I have reviewed.  This shows that her pulmonary  nodules are stable.  5 mm right upper lobe nodule is smaller, 5 mm anterior right upper lobe with calcification, 6 mm calcified right lower lobe subpleural nodule, 4 mm left upper lobe nodule is smaller, similar other scattered 2 to 3 mm nodules that are unchanged.   She is having more breathing difficulty. Worse with exertion, worse with higher humidity. She is coughing in the am and also at night, thick stuff to clear in the am, sometimes with some blood in it. She is using mucinex prn. She uses albuterol most days. Her o2 is at 2-4L/min.    No flowsheet data  found. Gt  Review of Systems As per HPI    Objective:   Physical Exam Vitals:   10/03/17 1403  BP: 120/66  Pulse: (!) 102  SpO2: 90%  Weight: 90 lb (40.8 kg)   Gen: Pleasant, thin, NAD  ENT: No lesions,  mouth clear,  oropharynx clear, no postnasal drip  Neck: No JVD, no stridor  Lungs: , No accessory muscle use, very distant, soft exp wheeze B   Cardiovascular: RRR, heart sounds normal, no murmur or gallops, no peripheral edema  Musculoskeletal: No deformities, no cyanosis or clubbing  Neuro: awake, alert, appropriate, no focal deficits  Skin: Warm, rash over her left chest, left shoulder to scapula  CT chest 07/20/16 --  COMPARISON:  06/09/2015  FINDINGS: Cardiovascular: Normal heart size. There is no pericardial effusion. Aortic atherosclerosis. Calcifications within the RCA, LAD and left circumflex coronary artery noted.  Mediastinum/Nodes: No enlarged mediastinal or axillary lymph nodes. Thyroid gland, trachea, and esophagus demonstrate no significant findings.  Lungs/Pleura: No pleural effusion. Advanced changes of centrilobular and paraseptal emphysema identified. Diffuse bronchial wall thickening noted. Within the posteromedial right lower lobe there is a patchy area of progressive airspace consolidation which is increased from previous exam, image number 111 of series 3. Small pulmonary nodules are again identified. New nodule within the left upper lobe measures 4 mm, image 38 of series 3. The previous index nodule within the right lower lobe is unchanged measuring 6 mm, image 94 of series 3. Stable granuloma within the right middle lobe, image 68 of series 3. New nodular density in the right upper lobe measures 10 mm, image 60 of series 3. Also new is a right upper lobe lung nodule measuring 1 cm, image 50 of series 3.  Upper Abdomen: No acute abnormality.  Musculoskeletal: There is degenerative disc disease identified within the thoracic spine.  Stable chronic compression deformities within the mid thoracic spine.  IMPRESSION: 1. Progressive airspace densities within the posteromedial right lower lobe may represent the sequelae of chronic aspiration and/or infection. 2. There are several pulmonary nodules which appear new from previous exam. The largest measures 10 mm. Non-contrast chest CT at 3-6 months is recommended. If the nodules are stable at time of repeat CT, then future CT at 18-24 months (from today's scan) is considered optional for low-risk patients, but is recommended for high-risk patients. This recommendation follows the consensus statement: Guidelines for Management of Incidental Pulmonary Nodules Detected on CT Images: From the Fleischner Society 2017; Radiology 2017; 284:228-243. 3. Emphysema 4. Aortic atherosclerosis and multi vessel coronary artery calcification.    Assessment & Plan:  COPD  D  Slow functional decline with progressive disease.  We talked today about options for treatment.  I would like to get her on a schedule mucolytic I think this will help with secretion clearance.  Also we will try starting low-dose maintenance prednisone  to see if she benefits.  Please continue Spiriva and Breo as you have been taking them. Keep albuterol available to use 2 puffs if needed shortness of breath, chest tightness, wheezing. Please start prednisone 20 mg daily for the next week.  At that time decrease to 10 mg daily and stay on this dose until our next visit.  Call us if you believe you are having any problems or side effects on the new medicine. Use Mucinex 600 mg twice a day on a schedule. Follow with Dr [name] next available   Chronic respiratory failure (HCC) Continue your oxygen at 2 to 4 L/min.    Abnormal CT scan, chest Pulmonary nodules are stable, actually the 2 noncalcified nodules are smaller than they were on her prior CT.  This was done on 09/26/2017.  I am not sure that we need to plan to  follow any particular interval given the reassuring findings and her probable inability to tolerate any aggressive diagnostic or treatment interventions.  Levy Pupa, MD, PhD 10/03/2017, 2:34 PM [facility] [phone] or if no answer [phone]

## 2017-10-03 NOTE — Assessment & Plan Note (Signed)
Continue your oxygen at 2 to 4 L/min.

## 2017-10-03 NOTE — Patient Instructions (Signed)
Please continue Spiriva and Breo as you have been taking them. Keep albuterol available to use 2 puffs if needed shortness of breath, chest tightness, wheezing. Continue your oxygen at 2 to 4 L/min.  Please start prednisone 20 mg daily for the next week.  At that time decrease to 10 mg daily and stay on this dose until our next visit.  Call us if you believe you are having any problems or side effects on the new medicine. Use Mucinex 600 mg twice a day on a schedule. Follow with Dr [name] next available

## 2017-10-03 NOTE — Assessment & Plan Note (Signed)
Slow functional decline with progressive disease.  We talked today about options for treatment.  I would like to get her on a schedule mucolytic I think this will help with secretion clearance.  Also we will try starting low-dose maintenance prednisone to see if she benefits.  Please continue Spiriva and Breo as you have been taking them. Keep albuterol available to use 2 puffs if needed shortness of breath, chest tightness, wheezing. Please start prednisone 20 mg daily for the next week.  At that time decrease to 10 mg daily and stay on this dose until our next visit.  Call us if you believe you are having any problems or side effects on the new medicine. Use Mucinex 600 mg twice a day on a schedule. Follow with Dr [name] next available

## 2017-10-26 ENCOUNTER — Other Ambulatory Visit: Payer: Self-pay | Admitting: Emergency Medicine

## 2017-11-01 ENCOUNTER — Telehealth: Payer: Self-pay | Admitting: Emergency Medicine

## 2017-11-01 DIAGNOSIS — J449 Chronic obstructive pulmonary disease, unspecified: Secondary | ICD-10-CM

## 2017-11-01 NOTE — Telephone Encounter (Signed)
Called and spoke with pt who is requesting a call from RB to discuss next steps for pt's care.  Pt stated at last OV 6/10, pt was placed on prednisone and she states since then, she feels like she is barely able to make it and knows she now needs help.  Pt is needing guidance to see what can be done. Pt states she knows it is possibly time for hospice and wants to know what exactly should be done. Pt states she has heard of [facility] as well but wants to hear from Dr.Byrum personally what should be done.  Dr. [name], please advise on this for pt as she is upset and really wants to know what she should do.  Thanks!

## 2017-11-02 NOTE — Telephone Encounter (Signed)
Sent RB a text - he replied would call her later today Patient is aware and grateful

## 2017-11-02 NOTE — Telephone Encounter (Signed)
Spoke with her this evening. She feels that she has continued to experience an overall progressive decline. She did take the pred as we had planned at her last visit, but noted that it did not impact her breathing, only made her more anxious. She subsequently stopped it. She is having trouble with ADL's, does not feel safe or steady on her own at home. She in interested in which in-home resources are available, home health, hospice etc. I offered to pursue hospice, but she tells me that she would like to look into home health resources first. This seems reasonable - a home health assessment will let us know whether she needs equipment, home aide, RN services, PT/OT, etc. The company she would like to use is [facility].   Please send an order for a home health needs assessment. Thanks.   [facility] >> [phone]

## 2017-11-02 NOTE — Telephone Encounter (Signed)
Patient called back to follow up on RB recommendations

## 2017-11-02 NOTE — Telephone Encounter (Signed)
Called spoke with patient who is requesting to speak with RB personally - she feels that she "has really gone down hill" since her last office visit and believes it may be time for hospice-type services.  She is interested in a company called [facility] for this, but would like to talk to Dr [name] first.  Routing back to RB and paging him to let him know that patient is requesting to speak with him personally.

## 2017-11-03 ENCOUNTER — Telehealth: Payer: Self-pay | Admitting: Emergency Medicine

## 2017-11-03 NOTE — Telephone Encounter (Signed)
Called and spoke with patient, she states that she is not going to use [facility] as her company. She is not sure who she is going to use as of right now but she will give us a call once she decides. Nothing further needed.

## 2017-11-03 NOTE — Telephone Encounter (Signed)
Order has been placed per RB. Nothing further is needed.

## 2017-11-21 ENCOUNTER — Other Ambulatory Visit: Payer: Self-pay | Admitting: Emergency Medicine

## 2017-11-21 ENCOUNTER — Telehealth: Payer: Self-pay | Admitting: Emergency Medicine

## 2017-11-21 DIAGNOSIS — J449 Chronic obstructive pulmonary disease, unspecified: Secondary | ICD-10-CM

## 2017-11-21 NOTE — Addendum Note (Signed)
Addended by: Jaynee EaglesLEMONS, LINDSAY C on: 11/21/2017 04:43 PM   Modules accepted: Orders

## 2017-11-21 NOTE — Telephone Encounter (Signed)
Referral has been placed per RB. Nothing further was needed. 

## 2017-11-21 NOTE — Telephone Encounter (Signed)
Yes please order. I can be the attending of record if desired.

## 2017-11-21 NOTE — Telephone Encounter (Signed)
Called and spoke with Kennyth ArnoldStacy with palliative and [facility] at [phone] Per stacy pt's husband is requesting pt to be set up with palliative care Kennyth ArnoldStacy states that a referral needs to be sent in for palliative care consult  Called and spoke with pt's husband Ed at mobile [phone] Ed reports patient sleeping all night, and majority of the daytime, hasn't eaten in 48hrs, and not void in 6hrs, only 2 sips of water this morning, and one sip of protein shake. Husband is requesting palliative care consult  RB please advise.

## 2017-11-22 DIAGNOSIS — F339 Major depressive disorder, recurrent, unspecified: Secondary | ICD-10-CM | POA: Diagnosis not present

## 2017-11-22 DIAGNOSIS — J449 Chronic obstructive pulmonary disease, unspecified: Secondary | ICD-10-CM | POA: Diagnosis not present

## 2017-11-22 DIAGNOSIS — E039 Hypothyroidism, unspecified: Secondary | ICD-10-CM | POA: Diagnosis not present

## 2017-11-22 DIAGNOSIS — J301 Allergic rhinitis due to pollen: Secondary | ICD-10-CM | POA: Diagnosis not present

## 2017-11-22 DIAGNOSIS — J9610 Chronic respiratory failure, unspecified whether with hypoxia or hypercapnia: Secondary | ICD-10-CM | POA: Diagnosis not present

## 2017-11-23 DIAGNOSIS — E039 Hypothyroidism, unspecified: Secondary | ICD-10-CM | POA: Diagnosis not present

## 2017-11-23 DIAGNOSIS — J9610 Chronic respiratory failure, unspecified whether with hypoxia or hypercapnia: Secondary | ICD-10-CM | POA: Diagnosis not present

## 2017-11-23 DIAGNOSIS — J449 Chronic obstructive pulmonary disease, unspecified: Secondary | ICD-10-CM | POA: Diagnosis not present

## 2017-11-23 DIAGNOSIS — J301 Allergic rhinitis due to pollen: Secondary | ICD-10-CM | POA: Diagnosis not present

## 2017-11-23 DIAGNOSIS — F339 Major depressive disorder, recurrent, unspecified: Secondary | ICD-10-CM | POA: Diagnosis not present

## 2017-11-24 DIAGNOSIS — J301 Allergic rhinitis due to pollen: Secondary | ICD-10-CM | POA: Diagnosis not present

## 2017-11-24 DIAGNOSIS — F339 Major depressive disorder, recurrent, unspecified: Secondary | ICD-10-CM | POA: Diagnosis not present

## 2017-11-24 DIAGNOSIS — J449 Chronic obstructive pulmonary disease, unspecified: Secondary | ICD-10-CM | POA: Diagnosis not present

## 2017-11-24 DIAGNOSIS — J9610 Chronic respiratory failure, unspecified whether with hypoxia or hypercapnia: Secondary | ICD-10-CM | POA: Diagnosis not present

## 2017-11-24 DIAGNOSIS — E039 Hypothyroidism, unspecified: Secondary | ICD-10-CM | POA: Diagnosis not present

## 2017-11-27 DIAGNOSIS — F339 Major depressive disorder, recurrent, unspecified: Secondary | ICD-10-CM | POA: Diagnosis not present

## 2017-11-27 DIAGNOSIS — E039 Hypothyroidism, unspecified: Secondary | ICD-10-CM | POA: Diagnosis not present

## 2017-11-27 DIAGNOSIS — J449 Chronic obstructive pulmonary disease, unspecified: Secondary | ICD-10-CM | POA: Diagnosis not present

## 2017-11-27 DIAGNOSIS — J301 Allergic rhinitis due to pollen: Secondary | ICD-10-CM | POA: Diagnosis not present

## 2017-11-27 DIAGNOSIS — J9610 Chronic respiratory failure, unspecified whether with hypoxia or hypercapnia: Secondary | ICD-10-CM | POA: Diagnosis not present

## 2017-11-28 ENCOUNTER — Telehealth: Payer: Self-pay

## 2017-11-28 NOTE — Telephone Encounter (Signed)
On 11/28/17 I received a d/c from [employer] (original). The d/c is for cremation. The patient is a patient of Doctor Byrum.  The d/c will be taken to Pulmonary Unit for signature.  On 11/29/2017 I received the d/c back from Doctor Byrum.  I got the d/c ready and called the funeral home to let them know the d/c is ready for pickup.  I also faxed a copy to the funeral home per the funeral home request.

## 2017-12-05 ENCOUNTER — Ambulatory Visit: Payer: Medicare Other | Admitting: Emergency Medicine

## 2017-12-25 DEATH — deceased
